# Patient Record
Sex: Female | Born: 1960 | Race: White | Hispanic: No | Marital: Married | Smoking: Current some day smoker
Health system: Southern US, Community
[De-identification: ages and names within clinical notes are randomized; demographics above are authoritative.]

## PROBLEM LIST (undated history)

## (undated) DIAGNOSIS — E039 Hypothyroidism, unspecified: Secondary | ICD-10-CM

## (undated) DIAGNOSIS — E7800 Pure hypercholesterolemia, unspecified: Secondary | ICD-10-CM

## (undated) DIAGNOSIS — I10 Essential (primary) hypertension: Secondary | ICD-10-CM

## (undated) DIAGNOSIS — C50919 Malignant neoplasm of unspecified site of unspecified female breast: Secondary | ICD-10-CM

## (undated) DIAGNOSIS — G4730 Sleep apnea, unspecified: Secondary | ICD-10-CM

## (undated) DIAGNOSIS — M1990 Unspecified osteoarthritis, unspecified site: Secondary | ICD-10-CM

## (undated) DIAGNOSIS — R569 Unspecified convulsions: Secondary | ICD-10-CM

## (undated) DIAGNOSIS — IMO0002 Reserved for concepts with insufficient information to code with codable children: Secondary | ICD-10-CM

## (undated) HISTORY — PX: JOINT REPLACEMENT: SHX530

## (undated) HISTORY — DX: Reserved for concepts with insufficient information to code with codable children: IMO0002

## (undated) HISTORY — DX: Unspecified convulsions: R56.9

## (undated) HISTORY — DX: Pure hypercholesterolemia, unspecified: E78.00

## (undated) HISTORY — DX: Malignant neoplasm of unspecified site of unspecified female breast: C50.919

## (undated) HISTORY — PX: CATARACT EXTRACTION: SUR2

## (undated) HISTORY — DX: Essential (primary) hypertension: I10

## (undated) HISTORY — DX: Hypothyroidism, unspecified: E03.9

## (undated) HISTORY — DX: Unspecified osteoarthritis, unspecified site: M19.90

---

## 1986-11-28 HISTORY — PX: THYROIDECTOMY: SHX17

## 2003-11-29 HISTORY — PX: OTHER SURGICAL HISTORY: SHX169

## 2005-07-13 ENCOUNTER — Ambulatory Visit: Payer: Self-pay | Admitting: Obstetrics and Gynecology

## 2006-07-17 ENCOUNTER — Ambulatory Visit: Payer: Self-pay | Admitting: Obstetrics and Gynecology

## 2007-06-25 ENCOUNTER — Ambulatory Visit: Payer: Self-pay | Admitting: General Practice

## 2007-08-08 ENCOUNTER — Ambulatory Visit: Payer: Self-pay | Admitting: Internal Medicine

## 2008-03-04 ENCOUNTER — Ambulatory Visit: Payer: Self-pay

## 2008-04-29 ENCOUNTER — Ambulatory Visit: Payer: Self-pay | Admitting: Gastroenterology

## 2009-03-20 ENCOUNTER — Ambulatory Visit: Payer: Self-pay

## 2009-12-09 ENCOUNTER — Ambulatory Visit: Payer: Self-pay | Admitting: Specialist

## 2009-12-17 ENCOUNTER — Ambulatory Visit: Payer: Self-pay | Admitting: Specialist

## 2010-05-11 ENCOUNTER — Ambulatory Visit: Payer: Self-pay | Admitting: Internal Medicine

## 2010-06-23 ENCOUNTER — Ambulatory Visit: Payer: Self-pay | Admitting: Otolaryngology

## 2010-06-29 ENCOUNTER — Ambulatory Visit: Payer: Self-pay | Admitting: Otolaryngology

## 2010-08-23 ENCOUNTER — Encounter
Admission: RE | Admit: 2010-08-23 | Discharge: 2010-10-26 | Payer: Self-pay | Source: Home / Self Care | Admitting: Specialist

## 2010-11-28 HISTORY — PX: TOTAL KNEE ARTHROPLASTY: SHX125

## 2011-08-02 ENCOUNTER — Ambulatory Visit (HOSPITAL_COMMUNITY)
Admission: RE | Admit: 2011-08-02 | Discharge: 2011-08-02 | Disposition: A | Discharge status: Home / Self Care | Payer: BC Managed Care – PPO | Source: Ambulatory Visit | Attending: Orthopaedic Surgery | Admitting: Orthopaedic Surgery

## 2011-08-02 ENCOUNTER — Other Ambulatory Visit (HOSPITAL_COMMUNITY): Payer: Self-pay | Admitting: Orthopaedic Surgery

## 2011-08-02 ENCOUNTER — Encounter (HOSPITAL_COMMUNITY)
Admission: RE | Admit: 2011-08-02 | Discharge: 2011-08-02 | Disposition: A | Discharge status: Home / Self Care | Payer: BC Managed Care – PPO | Source: Ambulatory Visit | Attending: Orthopaedic Surgery | Admitting: Orthopaedic Surgery

## 2011-08-02 DIAGNOSIS — Z01811 Encounter for preprocedural respiratory examination: Secondary | ICD-10-CM | POA: Insufficient documentation

## 2011-08-02 DIAGNOSIS — IMO0002 Reserved for concepts with insufficient information to code with codable children: Secondary | ICD-10-CM | POA: Insufficient documentation

## 2011-08-02 DIAGNOSIS — Z01810 Encounter for preprocedural cardiovascular examination: Secondary | ICD-10-CM | POA: Insufficient documentation

## 2011-08-02 DIAGNOSIS — M1712 Unilateral primary osteoarthritis, left knee: Secondary | ICD-10-CM

## 2011-08-02 DIAGNOSIS — Z01812 Encounter for preprocedural laboratory examination: Secondary | ICD-10-CM | POA: Insufficient documentation

## 2011-08-02 DIAGNOSIS — I10 Essential (primary) hypertension: Secondary | ICD-10-CM | POA: Insufficient documentation

## 2011-08-02 DIAGNOSIS — M1710 Unilateral primary osteoarthritis, unspecified knee: Secondary | ICD-10-CM | POA: Insufficient documentation

## 2011-08-02 DIAGNOSIS — G4730 Sleep apnea, unspecified: Secondary | ICD-10-CM | POA: Insufficient documentation

## 2011-08-02 LAB — CBC
MCHC: 35.2 g/dL (ref 30.0–36.0)
Platelets: 278 10*3/uL (ref 150–400)
RDW: 12.7 % (ref 11.5–15.5)
WBC: 7.6 10*3/uL (ref 4.0–10.5)

## 2011-08-02 LAB — URINALYSIS, ROUTINE W REFLEX MICROSCOPIC
Bilirubin Urine: NEGATIVE
Ketones, ur: NEGATIVE mg/dL
Protein, ur: NEGATIVE mg/dL
Specific Gravity, Urine: 1.014 (ref 1.005–1.030)
Urobilinogen, UA: 0.2 mg/dL (ref 0.0–1.0)

## 2011-08-02 LAB — URINE MICROSCOPIC-ADD ON

## 2011-08-02 LAB — BASIC METABOLIC PANEL
BUN: 18 mg/dL (ref 6–23)
Calcium: 10.1 mg/dL (ref 8.4–10.5)
Creatinine, Ser: 0.84 mg/dL (ref 0.50–1.10)
GFR calc Af Amer: >60 mL/min (ref >60)

## 2011-08-02 LAB — PROTIME-INR
INR: 0.9 (ref 0.00–1.49)
Prothrombin Time: 12.3 seconds (ref 11.6–15.2)

## 2011-08-02 LAB — HCG, SERUM, QUALITATIVE: Preg, Serum: NEGATIVE

## 2011-08-09 ENCOUNTER — Inpatient Hospital Stay (HOSPITAL_COMMUNITY)
Admission: RE | Admit: 2011-08-09 | Discharge: 2011-08-12 | DRG: 209 | Disposition: A | Discharge status: Home / Self Care | Payer: BC Managed Care – PPO | Source: Ambulatory Visit | Attending: Orthopaedic Surgery | Admitting: Orthopaedic Surgery

## 2011-08-09 DIAGNOSIS — G40802 Other epilepsy, not intractable, without status epilepticus: Secondary | ICD-10-CM | POA: Diagnosis present

## 2011-08-09 DIAGNOSIS — K219 Gastro-esophageal reflux disease without esophagitis: Secondary | ICD-10-CM | POA: Diagnosis present

## 2011-08-09 DIAGNOSIS — Z01810 Encounter for preprocedural cardiovascular examination: Secondary | ICD-10-CM

## 2011-08-09 DIAGNOSIS — Z01812 Encounter for preprocedural laboratory examination: Secondary | ICD-10-CM

## 2011-08-09 DIAGNOSIS — I10 Essential (primary) hypertension: Secondary | ICD-10-CM | POA: Diagnosis present

## 2011-08-09 DIAGNOSIS — Z01818 Encounter for other preprocedural examination: Secondary | ICD-10-CM

## 2011-08-09 DIAGNOSIS — E039 Hypothyroidism, unspecified: Secondary | ICD-10-CM | POA: Diagnosis present

## 2011-08-09 DIAGNOSIS — G4733 Obstructive sleep apnea (adult) (pediatric): Secondary | ICD-10-CM | POA: Diagnosis present

## 2011-08-09 DIAGNOSIS — M1710 Unilateral primary osteoarthritis, unspecified knee: Principal | ICD-10-CM | POA: Diagnosis present

## 2011-08-09 DIAGNOSIS — M549 Dorsalgia, unspecified: Secondary | ICD-10-CM | POA: Diagnosis present

## 2011-08-09 LAB — TYPE AND SCREEN
ABO/RH(D): O POS
Antibody Screen: NEGATIVE

## 2011-08-09 LAB — ABO/RH: ABO/RH(D): O POS

## 2011-08-10 LAB — BASIC METABOLIC PANEL
Calcium: 8.6 mg/dL (ref 8.4–10.5)
GFR calc non Af Amer: >60 mL/min (ref >60)
Glucose, Bld: 112 mg/dL — ABNORMAL HIGH (ref 70–99)
Potassium: 2.9 mEq/L — ABNORMAL LOW (ref 3.5–5.1)
Sodium: 139 mEq/L (ref 135–145)

## 2011-08-10 LAB — PROTIME-INR: INR: 1.05 (ref 0.00–1.49)

## 2011-08-10 LAB — CBC
HCT: 30.2 % — ABNORMAL LOW (ref 36.0–46.0)
MCV: 92.1 fL (ref 78.0–100.0)
Platelets: 181 10*3/uL (ref 150–400)
RBC: 3.28 MIL/uL — ABNORMAL LOW (ref 3.87–5.11)
RDW: 12.9 % (ref 11.5–15.5)
WBC: 8.4 10*3/uL (ref 4.0–10.5)

## 2011-08-11 LAB — BASIC METABOLIC PANEL
BUN: 7 mg/dL (ref 6–23)
Chloride: 104 mEq/L (ref 96–112)
GFR calc Af Amer: >60 mL/min (ref >60)
GFR calc non Af Amer: >60 mL/min (ref >60)
Potassium: 2.9 mEq/L — ABNORMAL LOW (ref 3.5–5.1)
Sodium: 140 mEq/L (ref 135–145)

## 2011-08-11 LAB — PROTIME-INR
INR: 1.35 (ref 0.00–1.49)
Prothrombin Time: 16.9 seconds — ABNORMAL HIGH (ref 11.6–15.2)

## 2011-08-11 LAB — CBC
Hemoglobin: 9.1 g/dL — ABNORMAL LOW (ref 12.0–15.0)
Platelets: 156 10*3/uL (ref 150–400)
RBC: 2.88 MIL/uL — ABNORMAL LOW (ref 3.87–5.11)
WBC: 7.8 10*3/uL (ref 4.0–10.5)

## 2011-08-12 LAB — CBC
HCT: 27.2 % — ABNORMAL LOW (ref 36.0–46.0)
Hemoglobin: 9.3 g/dL — ABNORMAL LOW (ref 12.0–15.0)
MCHC: 34.2 g/dL (ref 30.0–36.0)
RBC: 2.97 MIL/uL — ABNORMAL LOW (ref 3.87–5.11)
WBC: 7.4 10*3/uL (ref 4.0–10.5)

## 2011-08-12 LAB — PROTIME-INR
INR: 1.41 (ref 0.00–1.49)
Prothrombin Time: 17.5 seconds — ABNORMAL HIGH (ref 11.6–15.2)

## 2011-08-12 LAB — BASIC METABOLIC PANEL
Chloride: 100 mEq/L (ref 96–112)
Creatinine, Ser: 0.62 mg/dL (ref 0.50–1.10)
GFR calc Af Amer: >60 mL/min (ref >60)
Sodium: 140 mEq/L (ref 135–145)

## 2011-08-19 NOTE — H&P (Signed)
NAMETAREVA, LESKE NO.:  [id number]  MEDICAL RECORD NO.:  [id number]  LOCATION:                                 FACILITY:  PHYSICIAN:  Lubertha Basque. Haidy Kackley, M.D.DATE OF BIRTH:  [birth year]  DATE OF ADMISSION:08/09/2011 DATE OF DISCHARGE:                             HISTORY & PHYSICAL   CHIEF COMPLAINT:  Left knee pain.  HISTORY OF PRESENT ILLNESS:  Jennifer Pratt is a patient well-known to our practice who is complaining of an increasing left knee pain.  To the point now, she is having trouble sleeping at nighttime and having pain with every step.  She has having swelling along her medial joint line and patellofemoral area.  She recently had an arthroscopy in January 2011 by one of the doctors at the [facility] in [place], at that time indicated significant patellofemoral degenerative joint disease as well as medial compartment DJD as well.  She has failed oral anti- inflammatory medicines and Supartz injections and we have discussed with her additional treatment options that being total knee replacement on the left side.  PAST MEDICAL HISTORY:  Her PCP doctor is in [place] at the [facility] Dr. Clearance Coots who is getting ready to retire and a PCP doctor will be assigned.  Current medication list consist of Carbatrol, Synthroid, hormone replacement therapy, and Diovan.  REVIEW OF SYSTEMS:  A 14-point review of the review of systems is positive for seizure disorder, hypothyroidism, hypertension.  PAST SURGICAL EXPERIENCE:  Knee arthroscopy in January 2012, also an LRTI right thumb, some time ago a C-section and a partial thyroidectomy.  She has no drug allergies.  SOCIAL HISTORY:  Does not smoke, does not drink more than social alcohol.  PHYSICAL EXAMINATION:  VITAL SIGNS:  Stable.  Pulse regular, respirations unlabored. HEENT:  PERRLA.  Visual fields are normal.  No oropharynx obstructions. LUNGS:  Clear. CARDIAC:  Regular rate and rhythm.  S1  and S2. ABDOMEN:  Soft.  Positive bowel sounds. EXTREMITIES:  Upper extremity motion of joint is full, good pulses, normal neurovascular status.  Lower extremity examination, good pulses distally bilaterally.  Hip motion, ankle motion bilaterally are full and painfree.  The left knee moves 0-125.  There is patellofemoral crepitation at 2+ and also medial joint line pain with a trace effusion. Calf soft and nontender.  Negative Homans.  Good ligamentous stability. No pretibial edema.  Negative for any vascular changes as well.  ASSESSMENT: 1. Left knee end-stage degenerative joint disease, status post     arthroscopy in 2011. 2. History of seizure disorder. 3. History of hypertension. 4. History of hypothyroidism.  X-ray findings show end-stage bone-on-bone degenerative joint disease of her left knee.  Plan is to proceed with a total knee replacement on the left side. Admit the patient postoperatively for pain control and then physical therapy, weightbearing as tolerated, and then make a decision onplacement whether being discharged home versus skilled nursing facility.     Lindwood Qua, P.A.   ______________________________ Lubertha Basque. Jerl Santos, M.D.    MC/MEDQ  D:  08/07/2011  T:  08/07/2011  Job:  [id number]  Electronically Signed by Lindwood Qua P.A. on 08/08/2011 08:32:26 AM Electronically Signed by Theron Arista  Verdelle Valtierra M.D. on 08/19/2011 12:28:43 PM

## 2011-08-19 NOTE — Op Note (Signed)
Jennifer Pratt, Jennifer Pratt NO.:  [id number]  MEDICAL RECORD NO.:  [id number]  LOCATION:  2550                         FACILITY:  [facility]  PHYSICIAN:  Lubertha Basque. Kaitlynd Phillips, M.D.DATE OF BIRTH:  1961-03-13  DATE OF PROCEDURE:  08/09/2011 DATE OF DISCHARGE:                              OPERATIVE REPORT   PREOPERATIVE DIAGNOSIS:  Left knee degenerative joint disease.  POSTOPERATIVE DIAGNOSIS:  Left knee degenerative joint disease.  PROCEDURE:  Left total knee replacement.  ANESTHESIA:  General and block.  ATTENDING SURGEON:  Lubertha Basque. Jerl Santos, MD  ASSISTANT:  Lindwood Qua, PA   INDICATIONS FOR PROCEDURE:  The patient is a 50 year old woman with a long history of a degenerative left knee.  This has become resistant to various injectables and pills.  Preop x-ray shows bone-on-bone contact. She has pain which limits her ability to rest and walk and she is offered a knee replacement.  Informed operative consent was obtained after discussion of possible complications including reaction to anesthesia, infection, DVT, PE, and death.  Importance of the postoperative rehabilitation protocol to optimize result was stressed extensively with the patient.  SUMMARY, FINDINGS, AND PROCEDURE:  Under general anesthesia and a block, a left knee replacement was performed.  She had advanced degenerative change, but excellent bone quality.  We addressed her problem with a cemented DePuy LCS system using a standard plus femur, 10 deep dish insert, 4 tibial tray, and 38 all-polyethylene patella.  I included Zinacef antibiotic in the cement.  Lindwood Qua assisted throughout and was invaluable to the completion of the case in that he helped position and retract while I performed the procedure.  He also closed simultaneously to help minimize OR time.  DESCRIPTION OR PROCEDURE:  The patient was taken to the operating suite where general anesthetic was applied without difficulty.   She was also given a block in the preanesthesia area.  She was positioned supine and prepped and draped in a normal sterile fashion.  After administration of IV Kefzol, the left leg was elevated, exsanguinated, tourniquet inflated about the thigh.  A longitudinal anterior incision was made with dissection down the extensor mechanism.  All appropriate antiinfective measures were used including closed hooded exhaust systems for each member of the surgical team, Betadine-impregnated drape, and preoperative IV antibiotic.  A medial parapatellar incision was made in the extensor mechanism.  The kneecap was flipped and the knee flexed. Some residual meniscal tissues were removed along with the ACL and PCL and fat pad.  An extramedullary guide was placed on the tibia to make a roughly flat cut.  An intramedullary guide was then placed in the femur to make anterior and posterior cuts creating a flexion gap of 10 mm.  A second intramedullary guide was placed in the femur to make a distal cut creating an equal extension gap of 10 mm balancing the knee.  The femur sized to a standard plus and the tibia to a 4 with appropriate guides placed and utilized.  The patella was cut down thickness by about 10 mm to 15 and sized to a 38 with the appropriate guide placed and utilized. The trial reduction was done with all these components and  the 10 spacer.  She easily came to slight hyperextension and flexed well with patella tracking in a normal position.  The trial components were removed followed by pulsatile lavage irrigation of all three cut bony surfaces.  Cement was mixed including Zinacef and was pressurized onto the bones followed by placement of the aforementioned DePuy LCS components.  Excess cement was trimmed and pressure was held on the components until the cement hardened.  The tourniquet was deflated and a small amount of bleeding was easily controlled with Bovie cautery and pressure.  The  wound was irrigated followed by placement of drain exiting superolaterally.  The extensor mechanism was reapproximated with #1 Vicryl in an interrupted fashion followed by subcutaneous reapproximation with 0 and 2-0 undyed Vicryl and skin closure with staples.  Adaptic was applied followed by dry gauze and loose Ace wrap. Estimated blood loss and intraoperative fluids can be obtained from anesthesia records as can accurate tourniquet time.  DISPOSITION:  The patient was extubated in the operating room and taken to the recovery room in stable addition.  She was to be admitted to the Orthopedic Surgery Service for appropriate postop care to include perioperative antibiotics and Coumadin plus Lovenox for DVT prophylaxis.     Lubertha Basque Jerl Santos, M.D.     PGD/MEDQ  D:  08/09/2011  T:  08/09/2011  Job:  [id number]  Electronically Signed by Marcene Corning M.D. on 08/19/2011 12:29:30 PM

## 2011-08-19 NOTE — Discharge Summary (Signed)
Jennifer Pratt, Jennifer Pratt NO.:  [id number]  MEDICAL RECORD NO.:  [id number]  LOCATION:  5038                         FACILITY:  [facility]  PHYSICIAN:  Lubertha Basque. Jerelyn Trimarco, M.D.DATE OF BIRTH:  1961-08-16  DATE OF ADMISSION:  08/09/2011 DATE OF DISCHARGE:  08/12/2011                              DISCHARGE SUMMARY   ADMITTING DIAGNOSES: 1. Left knee pain, end-stage degenerative joint disease. 2. History of seizure disorder. 3. History of hypertension. 4. History of hypothyroidism.  DISCHARGE DIAGNOSES: 1. Left knee pain, end-stage degenerative joint disease. 2. History of seizure disorder. 3. History of hypertension. 4. History of hypothyroidism. 5. Hypokalemia.  OPERATION:  Left total knee replacement.  BRIEF HISTORY:  Jennifer Pratt is a patient well known to our practice who has had increasing left knee pain and swelling along her medial joint line in patellofemoral area.  She has had an arthroscopy back in January 2011, by doctors at the [facility] in [place], and was noted at that time to have significant DJD.  Her x-rays revealed bone-on-bone end- stage arthritis in her left knee.  She has failed antiinflammatory medicines, corticosteroid injections and Supartz injections as well, and we have discussed treatment options that being total knee replacement.  PERTINENT LABORATORY AND X-RAY FINDINGS:  WBCs 7.6, RBCs 4.47, hemoglobin 14.3 with a drop down to 9.3.  Potassium 3.5 with a drop to 2.9 and this was supplemented as necessary.  Sodium 141, BUN 18, creatinine 0.84.  Last INR was 1.35 as she is on Coumadin for DVT prophylaxis.  COURSE IN THE HOSPITAL:  She was admitted postoperatively and placed on variety of p.o. and IV pain medications including a PCA Dilaudid pump. We kept on all of her lower home medicines which are outlined in the med discharge management sheet.  She is on a regular diet.  We had implemented total joint replacement protocol  orders as well as p.r.n. Orthopedic orders.  She had initially Lovenox and then Coumadin for DVT prophylaxis, regulated by pharmacy along with knee-high TEDs. Perioperative fluids and perioperative antibiotics which was Ancef, Foley catheter used for the first 24 hours and then discontinued. Physical therapy for weightbearing as tolerated, advancing of activities as tolerated as well.  The first day postoperative, her blood pressure was 100/64, temperature was 98, hemoglobin was 10.4, positive bowel sounds, negative guarding, no spleen or liver enlargement.  Breath sounds in all lung fields.  Foley catheter was in and was discontinued and able to void on her own.  Her postoperative drain in her left knee had been pulled up by accident, we changed her dressing the first day postoperative.  Wound was noted to be benign.  No sign of infection or erythema and drainage with good neurovascular status and calf soft and nontender.  The second day postop, the potassium was slightly low and this has been supplemented orally.  She worked with Physical Therapy, walked a total of 200 plus feet.  [facility] was arranged for home physical therapy and blood draws for INR and potentially potassium or electrolyte panel, but she continued abnormal vital signs and knee without sign of infection, eating and voiding well, and had a bowel movement.  CONDITION ON DISCHARGE:  Improved.  FOLLOWUP:  She remained on a low-sodium, heart-healthy diet.  May change her dressing daily.  Any sign of infection which will be redness, drainage, increasing pain, will call our office at [phone] for a visit. She will remain on Coumadin for 2 weeks postop with an INR tentatively between 2.0 and 3.0, and she was given three prescriptions, one for Coumadin, one for Robaxin, one for Percocet.  She will also remain on all of her home medicines as well as a potassium supplement that she has at home.  We will see her back  in our office in 2 weeks.     Lindwood Qua, P.A.   ______________________________ Lubertha Basque. Jerl Santos, M.D.    MC/MEDQ  D:  08/11/2011  T:  08/12/2011  Job:  [id number]  Electronically Signed by Lindwood Qua P.A. on 08/14/2011 10:53:10 AM Electronically Signed by Marcene Corning M.D. on 08/19/2011 12:29:35 PM

## 2012-05-15 ENCOUNTER — Ambulatory Visit: Payer: Self-pay | Admitting: Internal Medicine

## 2012-11-13 ENCOUNTER — Telehealth: Payer: Self-pay | Admitting: Internal Medicine

## 2012-11-13 NOTE — Telephone Encounter (Signed)
error 

## 2012-11-19 ENCOUNTER — Telehealth: Payer: Self-pay | Admitting: *Deleted

## 2012-11-19 NOTE — Telephone Encounter (Signed)
Cymbalta 30 mg capsule   Take one capsule by mouth every day  #30

## 2012-12-12 ENCOUNTER — Other Ambulatory Visit: Payer: Self-pay | Admitting: Internal Medicine

## 2012-12-13 NOTE — Telephone Encounter (Signed)
Sent in to pharmacy.  

## 2013-01-08 ENCOUNTER — Telehealth: Payer: Self-pay | Admitting: *Deleted

## 2013-01-08 NOTE — Telephone Encounter (Signed)
Dr. Lorin Picket, would you like me to try to move patient up earlier to see you.

## 2013-01-08 NOTE — Telephone Encounter (Signed)
Had a cancellation for 01/09/13.  Any way she could come at the open spot - I think at 10:45.

## 2013-01-08 NOTE — Telephone Encounter (Signed)
Patient was put on Symbalta about six months ago and she is wanting to talk to the physician about this medication.

## 2013-01-09 ENCOUNTER — Encounter: Payer: Self-pay | Admitting: Internal Medicine

## 2013-01-09 ENCOUNTER — Ambulatory Visit (INDEPENDENT_AMBULATORY_CARE_PROVIDER_SITE_OTHER): Payer: BC Managed Care – PPO | Admitting: Internal Medicine

## 2013-01-09 VITALS — BP 122/86 | HR 82 | Temp 98.8°F | Resp 16 | Ht 69.0 in | Wt 222.0 lb

## 2013-01-09 DIAGNOSIS — I10 Essential (primary) hypertension: Secondary | ICD-10-CM

## 2013-01-09 DIAGNOSIS — G40909 Epilepsy, unspecified, not intractable, without status epilepticus: Secondary | ICD-10-CM

## 2013-01-09 DIAGNOSIS — K259 Gastric ulcer, unspecified as acute or chronic, without hemorrhage or perforation: Secondary | ICD-10-CM

## 2013-01-09 DIAGNOSIS — G4733 Obstructive sleep apnea (adult) (pediatric): Secondary | ICD-10-CM

## 2013-01-09 DIAGNOSIS — E7800 Pure hypercholesterolemia, unspecified: Secondary | ICD-10-CM

## 2013-01-09 DIAGNOSIS — E039 Hypothyroidism, unspecified: Secondary | ICD-10-CM

## 2013-01-09 MED ORDER — DULOXETINE HCL 30 MG PO CPEP: 30.0000 mg | ORAL_CAPSULE | Freq: Every day | ORAL | Status: DC

## 2013-01-09 NOTE — Telephone Encounter (Signed)
Pt aware of appointment 2/12 @ 10:45.  She will be here

## 2013-01-11 ENCOUNTER — Encounter: Payer: Self-pay | Admitting: Internal Medicine

## 2013-01-11 DIAGNOSIS — I10 Essential (primary) hypertension: Secondary | ICD-10-CM | POA: Insufficient documentation

## 2013-01-11 DIAGNOSIS — E039 Hypothyroidism, unspecified: Secondary | ICD-10-CM | POA: Insufficient documentation

## 2013-01-11 DIAGNOSIS — E7800 Pure hypercholesterolemia, unspecified: Secondary | ICD-10-CM | POA: Insufficient documentation

## 2013-01-11 DIAGNOSIS — G40909 Epilepsy, unspecified, not intractable, without status epilepticus: Secondary | ICD-10-CM | POA: Insufficient documentation

## 2013-01-11 DIAGNOSIS — K259 Gastric ulcer, unspecified as acute or chronic, without hemorrhage or perforation: Secondary | ICD-10-CM | POA: Insufficient documentation

## 2013-01-11 DIAGNOSIS — G4733 Obstructive sleep apnea (adult) (pediatric): Secondary | ICD-10-CM | POA: Insufficient documentation

## 2013-01-11 NOTE — Progress Notes (Signed)
Subjective:    Patient ID: Jennifer Pratt, female    DOB: 01/25/1961, 52 y.o.   MRN: [id number]  HPI 52 year old female with past history of hypertension, hypercholesterolemia, gastric ulcer and seizure disorder.  She comes in today for a scheduled follow up.  She previously had been on cymbalta.  Was able to get off meloxicam and ultram - when on the cymbalta.  Since being off the cymbalta, she has noticed pain returning.  Was exercising 3x/week.  Plans to restart.  No chest pain or tightness.  No sob.  Eating and drinking well.  Bowels stable.    Past Medical History  Diagnosis Date  . Hypothyroidism   . Hypertension   . Arthritis   . Seizures   . Ulcer     Gastric  . Hypercholesterolemia      Outpatient Encounter Prescriptions as of 01/09/2013  Medication Sig Dispense Refill  . carbamazepine (CARBATROL) 200 MG 12 hr capsule Take 200 mg by mouth 2 (two) times daily.      . DULoxetine (CYMBALTA) 30 MG capsule Take 1 capsule (30 mg total) by mouth daily.  30 capsule  4  . levothyroxine (SYNTHROID, LEVOTHROID) 150 MCG tablet TAKE 1 TABLET BY MOUTH EVERY DAY  30 tablet  2  . Multiple Vitamin (MULTIVITAMIN) tablet Take 1 tablet by mouth daily.      Marland Kitchen NASONEX 50 MCG/ACT nasal spray USE 2 PUFFS IN EACH NOSTRIL DAILY  17 g  2  . omeprazole (PRILOSEC OTC) 20 MG tablet Take 20 mg by mouth daily.      . valsartan-hydrochlorothiazide (DIOVAN-HCT) 160-25 MG per tablet Take 1 tablet by mouth daily.      . [DISCONTINUED] DULoxetine (CYMBALTA) 30 MG capsule Take 30 mg by mouth daily.      . [DISCONTINUED] meloxicam (MOBIC) 15 MG tablet Take 15 mg by mouth daily.       No facility-administered encounter medications on file as of 01/09/2013.    Review of Systems Patient denies any headache, lightheadedness or dizziness.  No significant sinus or allergy symptoms.  No chest pain, tightness or palpitations.  No increased shortness of breath, cough or congestion.  No nausea or vomiting. No acid  reflux.   No abdominal pain or cramping.  No bowel change, such as diarrhea, constipation, BRBPR or melana.  No urine change.  Did well with the cymbalta.  Wants to restart.       Objective:   Physical Exam Filed Vitals:   01/09/13 1100  BP: 122/86  Pulse: 82  Temp: 98.8 F (37.1 C)  Resp: 16   Blood pressure recheck:  130/82 (left) and 128/84-86 (right)  52 year old female in no acute distress.   HEENT:  Nares- clear.  Oropharynx - without lesions. NECK:  Supple.  Nontender.  No audible bruit.  HEART:  Appears to be regular. LUNGS:  No crackles or wheezing audible.  Respirations even and unlabored.  RADIAL PULSE:  Equal bilaterally.   ABDOMEN:  Soft, nontender.  Bowel sounds present and normal.  No audible abdominal bruit.    EXTREMITIES:  No increased edema present.  DP pulses palpable and equal bilaterally.      SKIN:  No rash.       Assessment & Plan:  MSK.  Did well with cymbalta.  Will restart 30mg  q day.  Remain off ultram and meloxicam.  Follow.  Restart her exercise.    HEALTH MAINTENANCE.  Physical 05/03/12.  Mammogram 05/15/12 -  BiRADS I.  Colonoscopy 07/24/12 - obtain results.  Schedule physical - next visit.

## 2013-01-11 NOTE — Assessment & Plan Note (Signed)
Using CPAP.  Follow.  

## 2013-01-11 NOTE — Assessment & Plan Note (Signed)
Low cholesterol diet and exercise.  Check lipid panel.   

## 2013-01-11 NOTE — Assessment & Plan Note (Signed)
Symptoms controlled.  On omeprazole.   

## 2013-01-11 NOTE — Assessment & Plan Note (Signed)
Currently stable.  Followed by Dr Willis and Carolyn Martin.  Off tramadol.    

## 2013-01-11 NOTE — Assessment & Plan Note (Signed)
On thyroid replacement.  Check tsh.  

## 2013-01-11 NOTE — Assessment & Plan Note (Signed)
Blood pressure as outlined.  Have her spot check her pressures and send in.  Follow. Check metabolic panel.

## 2013-01-29 ENCOUNTER — Other Ambulatory Visit: Payer: BC Managed Care – PPO

## 2013-02-06 ENCOUNTER — Other Ambulatory Visit (INDEPENDENT_AMBULATORY_CARE_PROVIDER_SITE_OTHER): Payer: BC Managed Care – PPO

## 2013-02-06 DIAGNOSIS — E039 Hypothyroidism, unspecified: Secondary | ICD-10-CM

## 2013-02-06 DIAGNOSIS — I10 Essential (primary) hypertension: Secondary | ICD-10-CM

## 2013-02-06 DIAGNOSIS — E7800 Pure hypercholesterolemia, unspecified: Secondary | ICD-10-CM

## 2013-02-06 LAB — COMPREHENSIVE METABOLIC PANEL
ALT: 18 U/L (ref 0–35)
AST: 19 U/L (ref 0–37)
Calcium: 9.2 mg/dL (ref 8.4–10.5)
Chloride: 99 mEq/L (ref 96–112)
Creatinine, Ser: 0.9 mg/dL (ref 0.4–1.2)
Sodium: 138 mEq/L (ref 135–145)
Total Bilirubin: 0.8 mg/dL (ref 0.3–1.2)
Total Protein: 7.3 g/dL (ref 6.0–8.3)

## 2013-02-06 LAB — LIPID PANEL
HDL: 57.2 mg/dL (ref >39.00)
VLDL: 40.8 mg/dL — ABNORMAL HIGH (ref 0.0–40.0)

## 2013-02-06 LAB — LDL CHOLESTEROL, DIRECT: Direct LDL: 138.2 mg/dL

## 2013-02-06 LAB — TSH: TSH: 7.57 u[IU]/mL — ABNORMAL HIGH (ref 0.35–5.50)

## 2013-02-20 ENCOUNTER — Telehealth: Payer: Self-pay | Admitting: Internal Medicine

## 2013-02-20 NOTE — Telephone Encounter (Signed)
Patient wanting her lab results

## 2013-02-20 NOTE — Telephone Encounter (Signed)
Lab results not in yet.

## 2013-02-23 ENCOUNTER — Telehealth: Payer: Self-pay | Admitting: Internal Medicine

## 2013-02-23 DIAGNOSIS — E876 Hypokalemia: Secondary | ICD-10-CM

## 2013-02-23 DIAGNOSIS — E039 Hypothyroidism, unspecified: Secondary | ICD-10-CM

## 2013-02-23 MED ORDER — LEVOTHYROXINE SODIUM 175 MCG PO TABS: 175.0000 ug | ORAL_TABLET | Freq: Every day | ORAL | Status: DC

## 2013-02-23 NOTE — Telephone Encounter (Signed)
Pt notified of lab results and need to change thyroid medication to q day.  Recheck tsh in 4 weeks.  Will send info on foods with increased potassium and recheck potassium in 4 weeks.  Send [employer].  Will follow.  Please schedule pt for labs on 03/27/13 at 8:15.  Pt aware of appt and all information.  Just need to put on lab schedule.  Thanks.

## 2013-02-25 ENCOUNTER — Ambulatory Visit: Payer: BC Managed Care – PPO | Admitting: Internal Medicine

## 2013-02-25 NOTE — Telephone Encounter (Signed)
Appointment made

## 2013-03-04 NOTE — Telephone Encounter (Signed)
Results received

## 2013-03-14 ENCOUNTER — Other Ambulatory Visit: Payer: Self-pay | Admitting: Internal Medicine

## 2013-03-14 ENCOUNTER — Other Ambulatory Visit: Payer: Self-pay

## 2013-03-14 MED ORDER — LEVOTHYROXINE SODIUM 175 MCG PO TABS: 175.0000 ug | ORAL_TABLET | Freq: Every day | ORAL | Status: DC

## 2013-03-14 NOTE — Telephone Encounter (Signed)
Sent medication refill for Synthroid #30 with 3 refills to the [facility]

## 2013-03-14 NOTE — Telephone Encounter (Signed)
Regarding her thyroid med refill - on 02/06/13 phone note - she was changed to .  It was not changed on the med list.  Ok to refill q day #30 with 3 refills.  Thanks.

## 2013-03-14 NOTE — Telephone Encounter (Signed)
Please Advise..... On pt medication list she is getting  Synthroid #30 with 3 RF. [facility] sent a request for the Synthroid 150 mcg #30 with 2 RF.

## 2013-03-27 ENCOUNTER — Other Ambulatory Visit (INDEPENDENT_AMBULATORY_CARE_PROVIDER_SITE_OTHER): Payer: BC Managed Care – PPO

## 2013-03-27 DIAGNOSIS — E039 Hypothyroidism, unspecified: Secondary | ICD-10-CM

## 2013-03-27 DIAGNOSIS — E876 Hypokalemia: Secondary | ICD-10-CM

## 2013-04-03 ENCOUNTER — Encounter: Payer: Self-pay | Admitting: Internal Medicine

## 2013-04-03 ENCOUNTER — Ambulatory Visit (INDEPENDENT_AMBULATORY_CARE_PROVIDER_SITE_OTHER): Payer: BC Managed Care – PPO | Admitting: Internal Medicine

## 2013-04-03 VITALS — BP 118/78 | HR 92 | Temp 99.2°F | Ht 69.0 in | Wt 218.0 lb

## 2013-04-03 DIAGNOSIS — E7800 Pure hypercholesterolemia, unspecified: Secondary | ICD-10-CM

## 2013-04-03 DIAGNOSIS — G40909 Epilepsy, unspecified, not intractable, without status epilepticus: Secondary | ICD-10-CM

## 2013-04-03 DIAGNOSIS — E876 Hypokalemia: Secondary | ICD-10-CM

## 2013-04-03 DIAGNOSIS — E039 Hypothyroidism, unspecified: Secondary | ICD-10-CM

## 2013-04-03 DIAGNOSIS — G4733 Obstructive sleep apnea (adult) (pediatric): Secondary | ICD-10-CM

## 2013-04-03 DIAGNOSIS — I10 Essential (primary) hypertension: Secondary | ICD-10-CM

## 2013-04-03 DIAGNOSIS — K259 Gastric ulcer, unspecified as acute or chronic, without hemorrhage or perforation: Secondary | ICD-10-CM

## 2013-04-03 MED ORDER — TRIAMTERENE-HCTZ 37.5-25 MG PO TABS: 1.0000 | ORAL_TABLET | Freq: Every day | ORAL | Status: DC

## 2013-04-03 MED ORDER — VALSARTAN 160 MG PO TABS: 160.0000 mg | ORAL_TABLET | Freq: Every day | ORAL | Status: DC

## 2013-04-03 NOTE — Progress Notes (Signed)
  Subjective:    Patient ID: Jennifer Pratt, female    DOB: [birth year], 52 y.o.   MRN: [id number]  HPI 52 year old female with past history of hypertension, hypercholesterolemia, gastric ulcer and seizure disorder.  She comes in today for a scheduled follow up.  Here to discuss her low potassium and to discuss changing her medication.  Her potassium has been low for a while.  She is on Valsartan/HCTZ.  Here to discuss changing her medication.   Is exercising.  Has lost weight.  No chest pain or tightness.  No sob.  Eating and drinking well.  Bowels stable.    Past Medical History  Diagnosis Date  . Hypothyroidism   . Hypertension   . Arthritis   . Seizures   . Ulcer     Gastric  . Hypercholesterolemia      Outpatient Encounter Prescriptions as of 04/03/2013  Medication Sig Dispense Refill  . carbamazepine (CARBATROL) 200 MG 12 hr capsule Take 200 mg by mouth 2 (two) times daily.      . DULoxetine (CYMBALTA) 30 MG capsule Take 1 capsule (30 mg total) by mouth daily.  30 capsule  4  . levothyroxine (SYNTHROID) 175 MCG tablet Take 1 tablet (175 mcg total) by mouth daily.  30 tablet  3  . Multiple Vitamin (MULTIVITAMIN) tablet Take 1 tablet by mouth daily.      Marland Kitchen omeprazole (PRILOSEC OTC) 20 MG tablet Take 20 mg by mouth daily.      . [DISCONTINUED] valsartan-hydrochlorothiazide (DIOVAN-HCT) 160-25 MG per tablet Take 1 tablet by mouth daily.      Marland Kitchen NASONEX 50 MCG/ACT nasal spray USE 2 PUFFS IN EACH NOSTRIL DAILY  17 g  2  . triamterene-hydrochlorothiazide (MAXZIDE-25) 37.5-25 MG per tablet Take 1 tablet by mouth daily.  30 tablet  3  . valsartan (DIOVAN) 160 MG tablet Take 1 tablet (160 mg total) by mouth daily.  30 tablet  3   No facility-administered encounter medications on file as of 04/03/2013.    Review of Systems Patient denies any headache, lightheadedness or dizziness.  No significant sinus or allergy symptoms.  No chest pain, tightness or palpitations.  No increased shortness of  breath, cough or congestion.  Breathing better.  No nausea or vomiting. No acid reflux.   No abdominal pain or cramping.  No bowel change, such as diarrhea, constipation, BRBPR or melana.  No urine change.  Exercising.  Feels better.       Objective:   Physical Exam  Filed Vitals:   04/03/13 0805  BP: 118/78  Pulse: 92  Temp: 99.2 F (37.3 C)   Blood pressure recheck:  120/78, pulse 44  52 year old female in no acute distress.   HEENT:  Nares- clear.  Oropharynx - without lesions. NECK:  Supple.  Nontender.  No audible bruit.  HEART:  Appears to be regular. LUNGS:  No crackles or wheezing audible.  Respirations even and unlabored.  RADIAL PULSE:  Equal bilaterally.   ABDOMEN:  Soft, nontender.  Bowel sounds present and normal.  No audible abdominal bruit.    EXTREMITIES:  No increased edema present.  DP pulses palpable and equal bilaterally.      Assessment & Plan:  MSK.  Back on cymbalta.  Doing well.  Exercising.    HEALTH MAINTENANCE.  Physical 05/03/12.  Mammogram 05/15/12 - BiRADS I.  Colonoscopy 07/24/12 - obtain results.  Schedule physical - next visit.

## 2013-04-07 ENCOUNTER — Encounter: Payer: Self-pay | Admitting: Internal Medicine

## 2013-04-07 NOTE — Assessment & Plan Note (Signed)
On thyroid replacement.  Follow tsh.  

## 2013-04-07 NOTE — Assessment & Plan Note (Signed)
Blood pressure as outlined. Doing well.  Persistent decreased potassium.  Will change valsartan/hctz 160/25 to valsartan 160mg  q day and triam/hctz 37.5/25 q day.  Recheck potassium in two weeks.

## 2013-04-07 NOTE — Assessment & Plan Note (Signed)
Currently stable.  Followed by Dr Willis and Carolyn Martin.  Off tramadol.    

## 2013-04-07 NOTE — Assessment & Plan Note (Signed)
Symptoms controlled.  On omeprazole.   

## 2013-04-07 NOTE — Assessment & Plan Note (Signed)
Low cholesterol diet and exercise.  Follow lipid panel.   

## 2013-04-07 NOTE — Assessment & Plan Note (Signed)
Using CPAP.  Follow.  

## 2013-04-18 ENCOUNTER — Telehealth: Payer: Self-pay | Admitting: Internal Medicine

## 2013-04-18 ENCOUNTER — Other Ambulatory Visit (INDEPENDENT_AMBULATORY_CARE_PROVIDER_SITE_OTHER): Payer: BC Managed Care – PPO

## 2013-04-18 DIAGNOSIS — E876 Hypokalemia: Secondary | ICD-10-CM

## 2013-04-18 NOTE — Telephone Encounter (Signed)
Jennifer Pratt came in today for lab work  She has a cpx schedule for 05/07/13 @ 3:30 and her mom Jennifer Pratt ([birth year]/09/35) has a new patient appointment with you on 05/30/13 @ 9:45.  She wanted to know if they could switch appointment date and time.  You have 2 new patient on 7/3 so I told Jennifer Pratt I needed to check with you before switching

## 2013-04-19 ENCOUNTER — Encounter: Payer: Self-pay | Admitting: *Deleted

## 2013-04-19 NOTE — Telephone Encounter (Signed)
I would prefer for Keondria to keep her appt as scheduled.  I can see Nicoletta Dress on 04/25/13 at 11:00.  Will need to block slot - new pt.  Also, can you call [facility] and request her last note be sent over.  Can call michelle [phone] ext [phone] and leave her a message to send.  Thanks

## 2013-04-19 NOTE — Telephone Encounter (Signed)
Spoke with ms Marone she will be here for her appointment and she wanted to keep her mom's July appointment

## 2013-04-19 NOTE — Telephone Encounter (Signed)
Left message  For pt to call office see dr scott's note below.  i did make appointment for her mom for 5/29 confirm she wants to keep may or July appointment. Cancel the one pt does not want

## 2013-05-07 ENCOUNTER — Encounter: Payer: BC Managed Care – PPO | Admitting: Internal Medicine

## 2013-06-06 ENCOUNTER — Other Ambulatory Visit: Payer: Self-pay | Admitting: *Deleted

## 2013-06-06 MED ORDER — DULOXETINE HCL 30 MG PO CPEP: 30.0000 mg | ORAL_CAPSULE | Freq: Every day | ORAL | Status: DC

## 2013-06-24 ENCOUNTER — Other Ambulatory Visit (HOSPITAL_COMMUNITY)
Admission: RE | Admit: 2013-06-24 | Discharge: 2013-06-24 | Disposition: A | Discharge status: Home / Self Care | Payer: BC Managed Care – PPO | Source: Ambulatory Visit | Attending: Internal Medicine | Admitting: Internal Medicine

## 2013-06-24 ENCOUNTER — Encounter: Payer: Self-pay | Admitting: Internal Medicine

## 2013-06-24 ENCOUNTER — Ambulatory Visit (INDEPENDENT_AMBULATORY_CARE_PROVIDER_SITE_OTHER): Payer: BC Managed Care – PPO | Admitting: Internal Medicine

## 2013-06-24 VITALS — BP 122/70 | HR 96 | Temp 99.0°F | Ht 68.0 in | Wt 215.5 lb

## 2013-06-24 DIAGNOSIS — E039 Hypothyroidism, unspecified: Secondary | ICD-10-CM

## 2013-06-24 DIAGNOSIS — R5381 Other malaise: Secondary | ICD-10-CM

## 2013-06-24 DIAGNOSIS — I10 Essential (primary) hypertension: Secondary | ICD-10-CM

## 2013-06-24 DIAGNOSIS — E7800 Pure hypercholesterolemia, unspecified: Secondary | ICD-10-CM

## 2013-06-24 DIAGNOSIS — Z124 Encounter for screening for malignant neoplasm of cervix: Secondary | ICD-10-CM

## 2013-06-24 DIAGNOSIS — Z01419 Encounter for gynecological examination (general) (routine) without abnormal findings: Secondary | ICD-10-CM | POA: Insufficient documentation

## 2013-06-24 DIAGNOSIS — R5383 Other fatigue: Secondary | ICD-10-CM

## 2013-06-24 DIAGNOSIS — E876 Hypokalemia: Secondary | ICD-10-CM | POA: Insufficient documentation

## 2013-06-24 DIAGNOSIS — Z1151 Encounter for screening for human papillomavirus (HPV): Secondary | ICD-10-CM | POA: Insufficient documentation

## 2013-06-24 DIAGNOSIS — G4733 Obstructive sleep apnea (adult) (pediatric): Secondary | ICD-10-CM

## 2013-06-24 DIAGNOSIS — G40909 Epilepsy, unspecified, not intractable, without status epilepticus: Secondary | ICD-10-CM

## 2013-06-24 LAB — COMPREHENSIVE METABOLIC PANEL
AST: 20 U/L (ref 0–37)
Alkaline Phosphatase: 68 U/L (ref 39–117)
BUN: 17 mg/dL (ref 6–23)
Calcium: 9.7 mg/dL (ref 8.4–10.5)
Chloride: 101 mEq/L (ref 96–112)
Creatinine, Ser: 0.8 mg/dL (ref 0.4–1.2)

## 2013-06-24 LAB — CBC WITH DIFFERENTIAL/PLATELET
Basophils Relative: 0.8 % (ref 0.0–3.0)
Eosinophils Absolute: 0.1 10*3/uL (ref 0.0–0.7)
HCT: 37.1 % (ref 36.0–46.0)
Hemoglobin: 12.7 g/dL (ref 12.0–15.0)
Lymphs Abs: 2.1 10*3/uL (ref 0.7–4.0)
MCHC: 34.2 g/dL (ref 30.0–36.0)
MCV: 92.8 fl (ref 78.0–100.0)
Monocytes Absolute: 0.4 10*3/uL (ref 0.1–1.0)
Neutro Abs: 2.4 10*3/uL (ref 1.4–7.7)
RBC: 4 Mil/uL (ref 3.87–5.11)

## 2013-06-24 LAB — T3, FREE: T3, Free: 2.5 pg/mL (ref 2.3–4.2)

## 2013-06-24 LAB — T4, FREE: Free T4: 0.86 ng/dL (ref 0.60–1.60)

## 2013-06-24 MED ORDER — DULOXETINE HCL 30 MG PO CPEP: 30.0000 mg | ORAL_CAPSULE | Freq: Every day | ORAL | Status: DC

## 2013-06-24 NOTE — Assessment & Plan Note (Signed)
Currently stable.  Followed by Dr Willis and Carolyn Martin.  Off tramadol.    

## 2013-06-24 NOTE — Assessment & Plan Note (Signed)
Changed to triam/hctz.  Check potassium today.

## 2013-06-24 NOTE — Assessment & Plan Note (Signed)
Symptoms controlled.  On omeprazole.   

## 2013-06-24 NOTE — Progress Notes (Signed)
Subjective:    Patient ID: Jennifer Pratt, female    DOB: Nov 08, 1961, 52 y.o.   MRN: [id number]  HPI 52 year old female with past history of hypertension, hypercholesterolemia, gastric ulcer and seizure disorder.  She comes in today to follow up on these issues as well as for a complete physical exam.   She states she is doing well.   Has lost weight.  No chest pain or tightness.  No sob.  Eating and drinking well.  Bowels stable.  She still reports some occasional fatigue, but overall feels good.  Sleeping well.  CPAP working well.  Blood pressure has been doing well.     Past Medical History  Diagnosis Date  . Hypothyroidism   . Hypertension   . Arthritis   . Seizures   . Ulcer     Gastric  . Hypercholesterolemia     Outpatient Encounter Prescriptions as of 06/24/2013  Medication Sig Dispense Refill  . carbamazepine (CARBATROL) 200 MG 12 hr capsule Take 200 mg by mouth 2 (two) times daily.      . DULoxetine (CYMBALTA) 30 MG capsule Take 1 capsule (30 mg total) by mouth daily.  30 capsule  2  . levothyroxine (SYNTHROID) 175 MCG tablet Take 1 tablet (175 mcg total) by mouth daily.  30 tablet  3  . Multiple Vitamin (MULTIVITAMIN) tablet Take 1 tablet by mouth daily.      Marland Kitchen omeprazole (PRILOSEC OTC) 20 MG tablet Take 20 mg by mouth daily.      Marland Kitchen triamterene-hydrochlorothiazide (MAXZIDE-25) 37.5-25 MG per tablet Take 1 tablet by mouth daily.  30 tablet  3  . valsartan (DIOVAN) 160 MG tablet Take 1 tablet (160 mg total) by mouth daily.  30 tablet  3  . [DISCONTINUED] NASONEX 50 MCG/ACT nasal spray USE 2 PUFFS IN EACH NOSTRIL DAILY  17 g  2   No facility-administered encounter medications on file as of 06/24/2013.    Review of Systems Patient denies any headache, lightheadedness or dizziness.  No significant sinus or allergy symptoms.  No chest pain, tightness or palpitations.  No increased shortness of breath, cough or congestion.  Breathing better.  No nausea or vomiting. No acid  reflux.   No abdominal pain or cramping.  No bowel change, such as diarrhea, constipation, BRBPR or melana.  No urine change.  Exercising.  Feels better.       Objective:   Physical Exam  Filed Vitals:   06/24/13 0804  BP: 122/70  Pulse: 96  Temp: 99 F (37.2 C)   Blood pressure recheck:  118/78, pulse 69  52 year old female in no acute distress.   HEENT:  Nares- clear.  Oropharynx - without lesions. NECK:  Supple.  Nontender.  No audible bruit.  HEART:  Appears to be regular. LUNGS:  No crackles or wheezing audible.  Respirations even and unlabored.  RADIAL PULSE:  Equal bilaterally.    BREASTS:  No nipple discharge or nipple retraction present.  Could not appreciate any distinct nodules or axillary adenopathy.  ABDOMEN:  Soft, nontender.  Bowel sounds present and normal.  No audible abdominal bruit.  GU:  Normal external genitalia.  Vaginal vault without lesions.  Cervix identified.  Pap performed. Could not appreciate any adnexal masses or tenderness.   RECTAL:  Heme negative.   EXTREMITIES:  No increased edema present.  DP pulses palpable and equal bilaterally.          Assessment & Plan:  MSK.  Back on cymbalta.  Doing well.  Exercising.  Not requiring Meloxicam.    HEALTH MAINTENANCE.  Physical today.  Mammogram 05/15/12 - BiRADS I.  Schedule follow up mammogram.  Colonoscopy overdue.  She plans to call and reschedule.

## 2013-06-24 NOTE — Assessment & Plan Note (Addendum)
On thyroid replacement.  Check thyroid function tests.    

## 2013-06-24 NOTE — Assessment & Plan Note (Signed)
Blood pressure has been doing well.  Continue current medication regimen.  Check metabolic panel.

## 2013-06-24 NOTE — Assessment & Plan Note (Addendum)
Low cholesterol diet and exercise.  Follow lipid panel.  Has eaten today.  Unable to check lipid profile today.

## 2013-06-24 NOTE — Assessment & Plan Note (Addendum)
Using CPAP.  Follow.  Feels this is working well for her.   

## 2013-06-25 ENCOUNTER — Ambulatory Visit: Payer: Self-pay | Admitting: Internal Medicine

## 2013-06-28 ENCOUNTER — Encounter: Payer: Self-pay | Admitting: Internal Medicine

## 2013-06-28 ENCOUNTER — Telehealth: Payer: Self-pay | Admitting: Internal Medicine

## 2013-06-28 DIAGNOSIS — E876 Hypokalemia: Secondary | ICD-10-CM

## 2013-06-28 NOTE — Telephone Encounter (Signed)
Pt notified of lab results and the need for a f/u lab appt in 10 days.  Please schedule her for a non fasting lab appt in 10 days and call her with an appt date and time.   Thanks.

## 2013-06-28 NOTE — Telephone Encounter (Signed)
Appoinment 8/12  Sent pt my chart message letting her know about appointment

## 2013-06-30 ENCOUNTER — Encounter: Payer: Self-pay | Admitting: Internal Medicine

## 2013-07-01 ENCOUNTER — Encounter: Payer: Self-pay | Admitting: Internal Medicine

## 2013-07-02 ENCOUNTER — Encounter: Payer: Self-pay | Admitting: Internal Medicine

## 2013-07-03 ENCOUNTER — Encounter: Payer: Self-pay | Admitting: Internal Medicine

## 2013-07-03 MED ORDER — LEVOTHYROXINE SODIUM 175 MCG PO TABS: 175.0000 ug | ORAL_TABLET | Freq: Every day | ORAL | Status: DC

## 2013-07-03 MED ORDER — POTASSIUM CHLORIDE ER 10 MEQ PO TBCR: 10.0000 meq | EXTENDED_RELEASE_TABLET | Freq: Every day | ORAL | Status: DC

## 2013-07-03 NOTE — Telephone Encounter (Signed)
Refilled thyrid medicaiton #90 with 3 refills and ordered kcl #30 with one refill

## 2013-07-04 ENCOUNTER — Encounter: Payer: Self-pay | Admitting: Internal Medicine

## 2013-07-08 ENCOUNTER — Encounter: Payer: Self-pay | Admitting: Internal Medicine

## 2013-07-09 ENCOUNTER — Other Ambulatory Visit: Payer: BC Managed Care – PPO

## 2013-07-18 ENCOUNTER — Other Ambulatory Visit (INDEPENDENT_AMBULATORY_CARE_PROVIDER_SITE_OTHER): Payer: BC Managed Care – PPO

## 2013-07-18 DIAGNOSIS — E876 Hypokalemia: Secondary | ICD-10-CM

## 2013-07-18 LAB — POTASSIUM: Potassium: 3.2 mEq/L — ABNORMAL LOW (ref 3.5–5.1)

## 2013-07-21 ENCOUNTER — Encounter: Payer: Self-pay | Admitting: Internal Medicine

## 2013-07-21 ENCOUNTER — Telehealth: Payer: Self-pay | Admitting: Internal Medicine

## 2013-07-21 MED ORDER — POTASSIUM CHLORIDE ER 10 MEQ PO TBCR: 10.0000 meq | EXTENDED_RELEASE_TABLET | Freq: Two times a day (BID) | ORAL | Status: DC

## 2013-07-21 NOTE — Telephone Encounter (Signed)
Pt notified of lab results via my chart.  Was started on potassium.  Needs a follow up potassium check in 2 weeks.  Please schedule a non fasting lab appt in 2 weeks and call pt with appt date and time.  Thanks.

## 2013-07-22 NOTE — Telephone Encounter (Signed)
Appointment 9/8 °Pt aware °

## 2013-07-29 ENCOUNTER — Other Ambulatory Visit: Payer: Self-pay | Admitting: Internal Medicine

## 2013-08-02 ENCOUNTER — Telehealth: Payer: Self-pay | Admitting: *Deleted

## 2013-08-02 DIAGNOSIS — E876 Hypokalemia: Secondary | ICD-10-CM

## 2013-08-02 NOTE — Telephone Encounter (Signed)
Pt is coming in for labs on Monday 09.08.2014 what labs and dx?  

## 2013-08-02 NOTE — Telephone Encounter (Signed)
Order placed for potassium check.  

## 2013-08-05 ENCOUNTER — Other Ambulatory Visit (INDEPENDENT_AMBULATORY_CARE_PROVIDER_SITE_OTHER): Payer: BC Managed Care – PPO

## 2013-08-05 DIAGNOSIS — E876 Hypokalemia: Secondary | ICD-10-CM

## 2013-08-05 LAB — POTASSIUM: Potassium: 3.8 mEq/L (ref 3.5–5.1)

## 2013-08-06 ENCOUNTER — Encounter: Payer: Self-pay | Admitting: Internal Medicine

## 2013-08-06 ENCOUNTER — Telehealth: Payer: Self-pay | Admitting: Internal Medicine

## 2013-08-06 DIAGNOSIS — E876 Hypokalemia: Secondary | ICD-10-CM

## 2013-08-06 NOTE — Telephone Encounter (Signed)
Pt notified of lab results via my chart.  She needs a follow up lab appt within the next 2 -3 weeks.  Please schedule her for a non fasting lab appt in 2-3 weeks and notify her of appt date and time.    Thanks Dr Lorin Picket

## 2013-08-06 NOTE — Telephone Encounter (Signed)
Appointment 10/2 pt aware °

## 2013-08-29 ENCOUNTER — Other Ambulatory Visit (INDEPENDENT_AMBULATORY_CARE_PROVIDER_SITE_OTHER): Payer: BC Managed Care – PPO

## 2013-08-29 DIAGNOSIS — E876 Hypokalemia: Secondary | ICD-10-CM

## 2013-08-29 LAB — POTASSIUM: Potassium: 3.9 mEq/L (ref 3.5–5.1)

## 2013-08-30 ENCOUNTER — Encounter: Payer: Self-pay | Admitting: Internal Medicine

## 2013-09-11 ENCOUNTER — Encounter: Payer: Self-pay | Admitting: Nurse Practitioner

## 2013-09-11 ENCOUNTER — Ambulatory Visit (INDEPENDENT_AMBULATORY_CARE_PROVIDER_SITE_OTHER): Payer: BC Managed Care – PPO | Admitting: Nurse Practitioner

## 2013-09-11 VITALS — BP 120/78 | HR 96 | Ht 69.0 in | Wt 218.0 lb

## 2013-09-11 DIAGNOSIS — G40309 Generalized idiopathic epilepsy and epileptic syndromes, not intractable, without status epilepticus: Secondary | ICD-10-CM | POA: Insufficient documentation

## 2013-09-11 DIAGNOSIS — G40109 Localization-related (focal) (partial) symptomatic epilepsy and epileptic syndromes with simple partial seizures, not intractable, without status epilepticus: Secondary | ICD-10-CM | POA: Insufficient documentation

## 2013-09-11 DIAGNOSIS — Z5181 Encounter for therapeutic drug level monitoring: Secondary | ICD-10-CM | POA: Insufficient documentation

## 2013-09-11 DIAGNOSIS — G40119 Localization-related (focal) (partial) symptomatic epilepsy and epileptic syndromes with simple partial seizures, intractable, without status epilepticus: Secondary | ICD-10-CM

## 2013-09-11 DIAGNOSIS — Z79899 Other long term (current) drug therapy: Secondary | ICD-10-CM

## 2013-09-11 MED ORDER — CARBAMAZEPINE ER 200 MG PO CP12: 200.0000 mg | ORAL_CAPSULE | Freq: Two times a day (BID) | ORAL | Status: DC

## 2013-09-11 NOTE — Progress Notes (Signed)
I have read the note, and I agree with the clinical assessment and plan.  Jennifer Pratt KEITH   

## 2013-09-11 NOTE — Patient Instructions (Signed)
Will obtain CBZ level Will renew Carbamazepine for the next year Call for any seizure activity F/U yearly

## 2013-09-11 NOTE — Progress Notes (Signed)
[facility]  PATIENT: Jennifer Pratt DOB: Aug 16, 1961   REASON FOR VISIT: Followup for seizure disorder   HISTORY OF PRESENT ILLNESS:Ms Levada Schilling, 52 year old white female returns today for followup. 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 She is currently on generic Carbatrol twice daily without side effects to the drug and no seizure activity in several years. She was  switched by pharmacy to generic 2 yrs ago and has not had problems.    She was on Dilantin at one time but felt confused on the drug. She denies missing any doses of her medications, no daytime drowsiness or balance issues. No headaches.  She does exercise at least 3 times weekly. Reviewed recent labs at PCP.  No new neurologic symptoms.    REVIEW OF SYSTEMS: Full 14 system review of systems performed and notable only for:  Constitutional: N/A  Cardiovascular: N/A  Ear/Nose/Throat: N/A  Skin: N/A  Eyes: N/A  Respiratory: N/A  Gastroitestinal: N/A  Hematology/Lymphatic: N/A  Endocrine: N/A Musculoskeletal: Joint pain Allergy/Immunology: N/A  Neurological: N/A Psychiatric: N/A   ALLERGIES: Allergies  Allergen Reactions  . Lisinopril     HOME MEDICATIONS: Outpatient Prescriptions Prior to Visit  Medication Sig Dispense Refill  . carbamazepine (CARBATROL) 200 MG 12 hr capsule Take 200 mg by mouth 2 (two) times daily.      . DULoxetine (CYMBALTA) 30 MG capsule Take 1 capsule (30 mg total) by mouth daily.  90 capsule  1  . levothyroxine (SYNTHROID) 175 MCG tablet Take 1 tablet (175 mcg total) by mouth daily.  90 tablet  3  . Multiple Vitamin (MULTIVITAMIN) tablet Take 1 tablet by mouth daily.      . potassium chloride (K-DUR) 10 MEQ tablet Take 1 tablet (10 mEq total) by mouth 2 (two) times daily.  60 tablet  1  . triamterene-hydrochlorothiazide (MAXZIDE-25) 37.5-25 MG per tablet TAKE 1 TABLET BY MOUTH DAILY.  30 tablet  5  . valsartan (DIOVAN)  160 MG tablet TAKE 1 TABLET (160 MG TOTAL) BY MOUTH DAILY.  30 tablet  5  . omeprazole (PRILOSEC OTC) 20 MG tablet Take 20 mg by mouth daily.       No facility-administered medications prior to visit.    PAST MEDICAL HISTORY: Past Medical History  Diagnosis Date  . Hypothyroidism   . Hypertension   . Arthritis   . Seizures   . Ulcer     Gastric  . Hypercholesterolemia     PAST SURGICAL HISTORY: Past Surgical History  Procedure Laterality Date  . Thyroidectomy  1998  . Thumb surgery  2005  . Cesarean section    . Total knee arthroplasty Left 2012    FAMILY HISTORY: Family History  Problem Relation Age of Onset  . Diabetes Father   . Breast cancer Neg Hx   . Colon cancer Neg Hx     SOCIAL HISTORY: History   Social History  . Marital Status: Married    Spouse Name: N/A    Number of Children: 1  . Years of Education: Some college   Occupational History  . Clerical position    Social History Main Topics  . Smoking status: Former Smoker    Quit date: 01/09/1991  . Smokeless tobacco: Never Used  . Alcohol Use: Yes     Comment: Socially  . Drug Use: No  . Sexual Activity: Not on file   Other Topics Concern  . Not on file  Social History Narrative  . No narrative on file     PHYSICAL EXAM  Filed Vitals:   09/11/13 0837  BP: 120/78  Pulse: 96  Height: 5\' 9"  (1.753 m)  Weight: 218 lb (98.884 kg)   Body mass index is 32.18 kg/(m^2).  Generalized: Well developed, in no acute distress  Neurological examination   Mentation: Alert oriented to time, place, history taking. Follows all commands speech and language fluent  Cranial nerve II-XII: Pupils were equal round reactive to light extraocular movements were full, visual field were full on confrontational test. Facial sensation and strength were normal. hearing was intact to finger rubbing bilaterally. Uvula tongue midline. head turning and shoulder shrug and were normal and symmetric.Tongue protrusion  into cheek strength was normal. Motor: normal bulk and tone, full strength in the BUE, BLE, fine finger movements normal, no pronator drift. No focal weakness Coordination: finger-nose-finger, heel-to-shin bilaterally, no dysmetria Reflexes: 1+ upper and lower and symmetric Gait and Station: Rising up from seated position without assistance, normal stance,  moderate stride, good arm swing, smooth turning, able to perform tiptoe, and heel walking without difficulty. Tandem steady  DIAGNOSTIC DATA (LABS, IMAGING, TESTING) - I reviewed patient records, labs, notes, testing and imaging myself where available.  Lab Results  Component Value Date   WBC 5.0 06/24/2013   HGB 12.7 06/24/2013   HCT 37.1 06/24/2013   MCV 92.8 06/24/2013   PLT 244.0 06/24/2013      Component Value Date/Time   NA 140 06/24/2013 0842   K 3.9 08/29/2013 0808   CL 101 06/24/2013 0842   CO2 33* 06/24/2013 0842   GLUCOSE 99 06/24/2013 0842   BUN 17 06/24/2013 0842   CREATININE 0.8 06/24/2013 0842   CALCIUM 9.7 06/24/2013 0842   PROT 7.0 06/24/2013 0842   ALBUMIN 4.2 06/24/2013 0842   AST 20 06/24/2013 0842   ALT 22 06/24/2013 0842   ALKPHOS 68 06/24/2013 0842   BILITOT 0.4 06/24/2013 0842   GFRNONAA >60 08/12/2011 0512   GFRAA >60 08/12/2011 0512   Lab Results  Component Value Date   CHOL 219* 02/06/2013   HDL 57.20 02/06/2013   LDLDIRECT 138.2 02/06/2013   TRIG 204.0* 02/06/2013   CHOLHDL 4 02/06/2013    ASSESSMENT AND PLAN  52 y.o. year old female  has a past medical history of Hypothyroidism; Hypertension; Arthritis; Seizures; Ulcer; and Hypercholesterolemia. here here to followup with seizure disorder. No seizures in 6-7 years   Will obtain CBZ level Will renew Carbamazepine for the next year Call for any seizure activity F/U yearly Nilda Riggs, [facility], [facility], APRN  [facility] [address] [place], [place] [address] [phone]

## 2013-09-21 ENCOUNTER — Other Ambulatory Visit: Payer: Self-pay | Admitting: Internal Medicine

## 2013-10-03 ENCOUNTER — Other Ambulatory Visit: Payer: Self-pay

## 2013-11-11 ENCOUNTER — Telehealth: Payer: Self-pay | Admitting: Nurse Practitioner

## 2013-11-11 NOTE — Telephone Encounter (Signed)
Called patient and she is wanting to speak with Ms Daphine Deutscher to get her suggestions on a neurologist referral (from our practice)  for her mother.

## 2013-11-11 NOTE — Telephone Encounter (Signed)
TC to Ms Massaro, any of the MD's in our practice would be good. She says thank you

## 2013-11-13 ENCOUNTER — Telehealth: Payer: Self-pay | Admitting: *Deleted

## 2013-11-13 NOTE — Telephone Encounter (Signed)
Her mother's name is Nicoletta Dress (12/09/35)

## 2013-11-13 NOTE — Telephone Encounter (Signed)
See previous message

## 2013-11-13 NOTE — Telephone Encounter (Signed)
FYI: Pt walked in this morning asking to see Dr. Lorin Picket for a Z-pack. She was informed by Lupita Leash that Dr. Lorin Picket is with patients right now. Pt said " no she's not, I can see her right there". So, I brought the patient back to an exam room to get more information from her & to inform her that we could not send in a Z-pack and we are full today. Before I was able to get any information from her, she said "then that's okay I'll just go to Urgent Care" then threw a form at me & said "just let Dr. Lorin Picket know that I need this from completed for my mom."  Then patient stormed out of the room. The form did not have any name or it, so I have no clue who her mom is.

## 2013-11-13 NOTE — Telephone Encounter (Signed)
noted 

## 2013-12-25 ENCOUNTER — Other Ambulatory Visit: Payer: Self-pay | Admitting: Internal Medicine

## 2013-12-25 ENCOUNTER — Encounter: Payer: Self-pay | Admitting: Internal Medicine

## 2013-12-25 ENCOUNTER — Ambulatory Visit (INDEPENDENT_AMBULATORY_CARE_PROVIDER_SITE_OTHER): Payer: BC Managed Care – PPO | Admitting: Internal Medicine

## 2013-12-25 VITALS — BP 100/70 | HR 92 | Temp 98.7°F | Ht 69.0 in | Wt 226.5 lb

## 2013-12-25 DIAGNOSIS — E7800 Pure hypercholesterolemia, unspecified: Secondary | ICD-10-CM

## 2013-12-25 DIAGNOSIS — R220 Localized swelling, mass and lump, head: Secondary | ICD-10-CM

## 2013-12-25 DIAGNOSIS — E039 Hypothyroidism, unspecified: Secondary | ICD-10-CM

## 2013-12-25 DIAGNOSIS — I10 Essential (primary) hypertension: Secondary | ICD-10-CM

## 2013-12-25 DIAGNOSIS — G40909 Epilepsy, unspecified, not intractable, without status epilepticus: Secondary | ICD-10-CM

## 2013-12-25 DIAGNOSIS — R5383 Other fatigue: Secondary | ICD-10-CM

## 2013-12-25 DIAGNOSIS — G40309 Generalized idiopathic epilepsy and epileptic syndromes, not intractable, without status epilepticus: Secondary | ICD-10-CM

## 2013-12-25 DIAGNOSIS — K259 Gastric ulcer, unspecified as acute or chronic, without hemorrhage or perforation: Secondary | ICD-10-CM

## 2013-12-25 DIAGNOSIS — E876 Hypokalemia: Secondary | ICD-10-CM

## 2013-12-25 DIAGNOSIS — R5381 Other malaise: Secondary | ICD-10-CM

## 2013-12-25 DIAGNOSIS — R221 Localized swelling, mass and lump, neck: Secondary | ICD-10-CM

## 2013-12-25 DIAGNOSIS — G4733 Obstructive sleep apnea (adult) (pediatric): Secondary | ICD-10-CM

## 2013-12-25 LAB — COMPREHENSIVE METABOLIC PANEL
ALBUMIN: 4.6 g/dL (ref 3.5–5.2)
ALK PHOS: 72 U/L (ref 39–117)
ALT: 25 U/L (ref 0–35)
AST: 20 U/L (ref 0–37)
BUN: 19 mg/dL (ref 6–23)
CALCIUM: 9.8 mg/dL (ref 8.4–10.5)
CHLORIDE: 100 meq/L (ref 96–112)
CO2: 30 mEq/L (ref 19–32)
Creatinine, Ser: 1 mg/dL (ref 0.4–1.2)
GFR: 65.68 mL/min (ref >60.00)
GLUCOSE: 92 mg/dL (ref 70–99)
POTASSIUM: 3.6 meq/L (ref 3.5–5.1)
SODIUM: 139 meq/L (ref 135–145)
Total Bilirubin: 0.6 mg/dL (ref 0.3–1.2)
Total Protein: 7.8 g/dL (ref 6.0–8.3)

## 2013-12-25 LAB — VITAMIN B12: Vitamin B-12: 487 pg/mL (ref 211–911)

## 2013-12-25 LAB — TSH: TSH: 1.51 u[IU]/mL (ref 0.35–5.50)

## 2013-12-25 NOTE — Progress Notes (Signed)
Pre-visit discussion using our clinic review tool. No additional management support is needed unless otherwise documented below in the visit note.  

## 2013-12-25 NOTE — Progress Notes (Signed)
Orders placed for labs

## 2013-12-26 ENCOUNTER — Telehealth: Payer: Self-pay | Admitting: Internal Medicine

## 2013-12-26 ENCOUNTER — Encounter: Payer: Self-pay | Admitting: Internal Medicine

## 2013-12-26 DIAGNOSIS — E876 Hypokalemia: Secondary | ICD-10-CM

## 2013-12-26 NOTE — Telephone Encounter (Signed)
Pt notified of lab results via my chart.  Needs a non fasting lab in 2 weeks.  Please schedule and contact pt with a lab appt date and time.  Thanks.

## 2013-12-29 ENCOUNTER — Encounter: Payer: Self-pay | Admitting: Internal Medicine

## 2013-12-29 DIAGNOSIS — R221 Localized swelling, mass and lump, neck: Secondary | ICD-10-CM | POA: Insufficient documentation

## 2013-12-29 NOTE — Assessment & Plan Note (Signed)
Low cholesterol diet and exercise.  Follow lipid panel.   

## 2013-12-29 NOTE — Progress Notes (Signed)
Subjective:    Patient ID: Jennifer Pratt, female    DOB: 1961-04-03, 53 y.o.   MRN: [id number]  HPI 53 year old female with past history of hypertension, hypercholesterolemia, gastric ulcer and seizure disorder.  She comes in today for a scheduled follow up.    She states she is doing well.   No chest pain or tightness.  No sob.  Eating and drinking well.  Bowels stable.  She still reports some occasional fatigue, but overall feels good.  Sleeping well.  CPAP working well.  Blood pressure has been doing well.  No period since 7/15.  Has noticed a know on the left side of her neck.  Occasionally sore.  Present for 4-5 months.  Some stress with her mother's medical issues.  This has leveled off now.  Overall she feels she is doing relatively well.     Past Medical History  Diagnosis Date  . Hypothyroidism   . Hypertension   . Arthritis   . Seizures   . Ulcer     Gastric  . Hypercholesterolemia     Outpatient Encounter Prescriptions as of 12/25/2013  Medication Sig  . carbamazepine (CARBATROL) 200 MG 12 hr capsule Take 1 capsule (200 mg total) by mouth 2 (two) times daily.  . diclofenac (VOLTAREN) 75 MG EC tablet   . DULoxetine (CYMBALTA) 30 MG capsule Take 1 capsule (30 mg total) by mouth daily.  Marland Kitchen KLOR-CON 10 10 MEQ tablet TAKE 1 TABLET (10 MEQ TOTAL) BY MOUTH 2 (TWO) TIMES DAILY.  Marland Kitchen levothyroxine (SYNTHROID) 175 MCG tablet Take 1 tablet (175 mcg total) by mouth daily.  . meloxicam (MOBIC) 15 MG tablet   . Multiple Vitamin (MULTIVITAMIN) tablet Take 1 tablet by mouth daily.  Marland Kitchen triamterene-hydrochlorothiazide (MAXZIDE-25) 37.5-25 MG per tablet TAKE 1 TABLET BY MOUTH DAILY.  . valsartan (DIOVAN) 160 MG tablet TAKE 1 TABLET (160 MG TOTAL) BY MOUTH DAILY.  . [DISCONTINUED] KLOR-CON M10 10 MEQ tablet     Review of Systems Patient denies any headache, lightheadedness or dizziness.  No significant sinus or allergy symptoms.  No chest pain, tightness or palpitations.  No increased shortness  of breath, cough or congestion.  Breathing better.  No nausea or vomiting. No acid reflux.   No abdominal pain or cramping.  No bowel change, such as diarrhea, constipation, BRBPR or melana.  No urine change.  No period since 7/15.  Neck nodule as outlined.        Objective:   Physical Exam  Filed Vitals:   12/25/13 0808  BP: 100/70  Pulse: 92  Temp: 98.7 F (37.1 C)   Blood pressure recheck:  9/96  53 year old female in no acute distress.   HEENT:  Nares- clear.  Oropharynx - without lesions. NECK:  Supple.  Nontender.  No audible bruit.  Palpable nodule left posterior lateral neck.  No increased erythema.   HEART:  Appears to be regular. LUNGS:  No crackles or wheezing audible.  Respirations even and unlabored.  RADIAL PULSE:  Equal bilaterally.   ABDOMEN:  Soft, nontender.  Bowel sounds present and normal.  No audible abdominal bruit.   EXTREMITIES:  No increased edema present.  DP pulses palpable and equal bilaterally.          Assessment & Plan:  MSK.  Back on cymbalta.  Stable.    HEALTH MAINTENANCE.  Physical 06/24/13.   Mammogram 05/2913 - BiRADS I.   Colonoscopy overdue.  She plans to call and reschedule.

## 2013-12-29 NOTE — Assessment & Plan Note (Signed)
Currently stable.  Followed by Dr Willis and Carolyn Martin.  Off tramadol.    

## 2013-12-29 NOTE — Assessment & Plan Note (Signed)
Persistent nodule - neck.  Refer to ENT for evaluation.

## 2013-12-29 NOTE — Assessment & Plan Note (Signed)
On thyroid replacement.  Check thyroid function tests.    

## 2013-12-29 NOTE — Assessment & Plan Note (Signed)
Symptoms controlled.  On omeprazole.   

## 2013-12-29 NOTE — Assessment & Plan Note (Signed)
Blood pressure as outlined.  Will decrease triam/hctz to 1/2 tablet per day.   Check metabolic panel.  Will see if can decrease her potassium supplements as well.  Follow pressure.

## 2013-12-29 NOTE — Assessment & Plan Note (Signed)
Using CPAP.  Follow.  Feels this is working well for her.   

## 2013-12-29 NOTE — Assessment & Plan Note (Signed)
Decrease triam/hctz to 1/2 tablet per day.  Check metabolic panel.  Follow pressures.

## 2013-12-30 NOTE — Telephone Encounter (Signed)
Sent my chart message letting pt know about appointment on 2/16

## 2014-01-13 ENCOUNTER — Other Ambulatory Visit: Payer: BC Managed Care – PPO

## 2014-01-24 ENCOUNTER — Other Ambulatory Visit: Payer: BC Managed Care – PPO

## 2014-03-01 ENCOUNTER — Other Ambulatory Visit: Payer: Self-pay | Admitting: Internal Medicine

## 2014-03-04 ENCOUNTER — Other Ambulatory Visit: Payer: Self-pay | Admitting: Internal Medicine

## 2014-03-05 NOTE — Telephone Encounter (Signed)
Sent Rx to pharmacy  

## 2014-03-08 ENCOUNTER — Other Ambulatory Visit: Payer: Self-pay | Admitting: Internal Medicine

## 2014-06-03 ENCOUNTER — Other Ambulatory Visit: Payer: Self-pay | Admitting: Internal Medicine

## 2014-06-03 NOTE — Telephone Encounter (Signed)
See my previous note

## 2014-06-03 NOTE — Telephone Encounter (Signed)
Last refill 4.7.15, last OV 1.28.15, no future OV.  Please advise refill.

## 2014-06-03 NOTE — Telephone Encounter (Signed)
Needs an appt scheduled and then can call in refill until appt.  Please call pt and schedule appt before refilling.

## 2014-06-04 NOTE — Telephone Encounter (Signed)
Pt scheduled 7.9.15 at 10:30am

## 2014-06-04 NOTE — Telephone Encounter (Signed)
Noted.  Will refill then.  Thanks.

## 2014-06-05 ENCOUNTER — Encounter: Payer: Self-pay | Admitting: Internal Medicine

## 2014-06-05 ENCOUNTER — Ambulatory Visit (INDEPENDENT_AMBULATORY_CARE_PROVIDER_SITE_OTHER): Payer: BC Managed Care – PPO | Admitting: Internal Medicine

## 2014-06-05 VITALS — BP 90/60 | HR 93 | Temp 98.8°F | Ht 69.0 in | Wt 219.5 lb

## 2014-06-05 DIAGNOSIS — G40909 Epilepsy, unspecified, not intractable, without status epilepticus: Secondary | ICD-10-CM

## 2014-06-05 DIAGNOSIS — R221 Localized swelling, mass and lump, neck: Secondary | ICD-10-CM

## 2014-06-05 DIAGNOSIS — Z1239 Encounter for other screening for malignant neoplasm of breast: Secondary | ICD-10-CM

## 2014-06-05 DIAGNOSIS — E7800 Pure hypercholesterolemia, unspecified: Secondary | ICD-10-CM

## 2014-06-05 DIAGNOSIS — R200 Anesthesia of skin: Secondary | ICD-10-CM

## 2014-06-05 DIAGNOSIS — G4733 Obstructive sleep apnea (adult) (pediatric): Secondary | ICD-10-CM

## 2014-06-05 DIAGNOSIS — R209 Unspecified disturbances of skin sensation: Secondary | ICD-10-CM

## 2014-06-05 DIAGNOSIS — E039 Hypothyroidism, unspecified: Secondary | ICD-10-CM

## 2014-06-05 DIAGNOSIS — R220 Localized swelling, mass and lump, head: Secondary | ICD-10-CM

## 2014-06-05 DIAGNOSIS — I10 Essential (primary) hypertension: Secondary | ICD-10-CM

## 2014-06-05 DIAGNOSIS — E876 Hypokalemia: Secondary | ICD-10-CM

## 2014-06-05 DIAGNOSIS — K259 Gastric ulcer, unspecified as acute or chronic, without hemorrhage or perforation: Secondary | ICD-10-CM

## 2014-06-05 DIAGNOSIS — Z79899 Other long term (current) drug therapy: Secondary | ICD-10-CM

## 2014-06-05 LAB — BASIC METABOLIC PANEL
BUN: 19 mg/dL (ref 6–23)
CALCIUM: 10 mg/dL (ref 8.4–10.5)
CO2: 27 mEq/L (ref 19–32)
CREATININE: 0.9 mg/dL (ref 0.4–1.2)
Chloride: 102 mEq/L (ref 96–112)
GFR: 69.79 mL/min (ref >60.00)
GLUCOSE: 106 mg/dL — AB (ref 70–99)
Potassium: 3.7 mEq/L (ref 3.5–5.1)
Sodium: 138 mEq/L (ref 135–145)

## 2014-06-05 LAB — CBC WITH DIFFERENTIAL/PLATELET
BASOS ABS: 0 10*3/uL (ref 0.0–0.1)
Basophils Relative: 0.7 % (ref 0.0–3.0)
Eosinophils Absolute: 0.1 10*3/uL (ref 0.0–0.7)
Eosinophils Relative: 1.7 % (ref 0.0–5.0)
HCT: 39.8 % (ref 36.0–46.0)
Hemoglobin: 13.6 g/dL (ref 12.0–15.0)
LYMPHS PCT: 35.2 % (ref 12.0–46.0)
Lymphs Abs: 1.9 10*3/uL (ref 0.7–4.0)
MCHC: 34.2 g/dL (ref 30.0–36.0)
MCV: 89.2 fl (ref 78.0–100.0)
MONOS PCT: 6.6 % (ref 3.0–12.0)
Monocytes Absolute: 0.4 10*3/uL (ref 0.1–1.0)
Neutro Abs: 3 10*3/uL (ref 1.4–7.7)
Neutrophils Relative %: 55.8 % (ref 43.0–77.0)
PLATELETS: 269 10*3/uL (ref 150.0–400.0)
RBC: 4.46 Mil/uL (ref 3.87–5.11)
RDW: 12.9 % (ref 11.5–15.5)
WBC: 5.4 10*3/uL (ref 4.0–10.5)

## 2014-06-05 LAB — LIPID PANEL
CHOLESTEROL: 238 mg/dL — AB (ref 0–200)
HDL: 54.9 mg/dL (ref >39.00)
LDL Cholesterol: 131 mg/dL — ABNORMAL HIGH (ref 0–99)
NonHDL: 183.1
Total CHOL/HDL Ratio: 4
Triglycerides: 259 mg/dL — ABNORMAL HIGH (ref 0.0–149.0)
VLDL: 51.8 mg/dL — ABNORMAL HIGH (ref 0.0–40.0)

## 2014-06-05 LAB — HEPATIC FUNCTION PANEL
ALT: 22 U/L (ref 0–35)
AST: 17 U/L (ref 0–37)
Albumin: 4.5 g/dL (ref 3.5–5.2)
Alkaline Phosphatase: 70 U/L (ref 39–117)
BILIRUBIN TOTAL: 0.7 mg/dL (ref 0.2–1.2)
Bilirubin, Direct: 0 mg/dL (ref 0.0–0.3)
Total Protein: 7.4 g/dL (ref 6.0–8.3)

## 2014-06-05 LAB — TSH: TSH: 0.09 u[IU]/mL — ABNORMAL LOW (ref 0.35–4.50)

## 2014-06-05 LAB — VITAMIN B12: Vitamin B-12: >1500 pg/mL — ABNORMAL HIGH (ref 211–911)

## 2014-06-05 MED ORDER — DULOXETINE HCL 30 MG PO CPEP: 30.0000 mg | ORAL_CAPSULE | Freq: Every day | ORAL | Status: DC

## 2014-06-05 NOTE — Progress Notes (Signed)
Pre visit review using our clinic review tool, if applicable. No additional management support is needed unless otherwise documented below in the visit note. 

## 2014-06-08 ENCOUNTER — Other Ambulatory Visit: Payer: Self-pay | Admitting: Internal Medicine

## 2014-06-08 DIAGNOSIS — E039 Hypothyroidism, unspecified: Secondary | ICD-10-CM

## 2014-06-08 DIAGNOSIS — E7800 Pure hypercholesterolemia, unspecified: Secondary | ICD-10-CM

## 2014-06-08 DIAGNOSIS — R739 Hyperglycemia, unspecified: Secondary | ICD-10-CM

## 2014-06-08 NOTE — Progress Notes (Signed)
Order placed for f/u labs.  

## 2014-06-09 ENCOUNTER — Encounter: Payer: Self-pay | Admitting: Internal Medicine

## 2014-06-09 DIAGNOSIS — R200 Anesthesia of skin: Secondary | ICD-10-CM | POA: Insufficient documentation

## 2014-06-09 NOTE — Assessment & Plan Note (Signed)
Low cholesterol diet and exercise.  Follow lipid panel.   

## 2014-06-09 NOTE — Assessment & Plan Note (Signed)
Reports numbness - bottom of both feet.  Filament testing - appears to be normal.  She request referral to podiatry.  She will call back with name.  Follow.

## 2014-06-09 NOTE — Assessment & Plan Note (Signed)
On thyroid replacement.  Check thyroid function tests.

## 2014-06-09 NOTE — Assessment & Plan Note (Signed)
Using CPAP.  Follow.  Feels this is working well for her.

## 2014-06-09 NOTE — Assessment & Plan Note (Signed)
Blood pressure as outlined.  On triam/hctz 1/2 tablet per day.   Check metabolic panel.   Follow pressure.  Continue same medication regimen.

## 2014-06-09 NOTE — Assessment & Plan Note (Signed)
Potassium 12/26/13 wnl.  Recheck today.

## 2014-06-09 NOTE — Assessment & Plan Note (Signed)
Referred to ENT for evaluation. 

## 2014-06-09 NOTE — Assessment & Plan Note (Signed)
Currently stable.  Followed by Dr Jannifer Franklin and Cecille Rubin.  Off tramadol.

## 2014-06-09 NOTE — Assessment & Plan Note (Signed)
Symptoms controlled.  On omeprazole.   

## 2014-06-09 NOTE — Progress Notes (Signed)
Subjective:    Patient ID: Jennifer Pratt, female    DOB: [birth year], 53 y.o.   MRN: [id number]  HPI 53 year old female with past history of hypertension, hypercholesterolemia, gastric ulcer and seizure disorder.  She comes in today for a scheduled follow up.    She states she is doing well.   No chest pain or tightness.  No sob.  Eating and drinking well.  Bowels stable.  Reports last period was 6/14 and had minimal spotting 8/14.  Minimal hot flashes. Sleeping well.  CPAP working well.  Blood pressure has been doing well.  Mother is doing better.  Stress better.  Overall she feels she is doing relatively well.  She does report some numbness bottom of her feet (bilateral).  Request referral to podiatry.  She will call and make appt/or let me know name of MD she wants to see.     Past Medical History  Diagnosis Date  . Hypothyroidism   . Hypertension   . Arthritis   . Seizures   . Ulcer     Gastric  . Hypercholesterolemia     Outpatient Encounter Prescriptions as of 06/05/2014  Medication Sig  . carbamazepine (CARBATROL) 200 MG 12 hr capsule Take 1 capsule (200 mg total) by mouth 2 (two) times daily.  . diclofenac (VOLTAREN) 75 MG EC tablet   . DULoxetine (CYMBALTA) 30 MG capsule Take 1 capsule (30 mg total) by mouth daily.  Marland Kitchen levothyroxine (SYNTHROID, LEVOTHROID) 175 MCG tablet TAKE 1 TABLET BY MOUTH EVERY DAY  . meloxicam (MOBIC) 15 MG tablet   . potassium chloride (KLOR-CON M10) 10 MEQ tablet TAKE 1 TABLET BY MOUTH ONCE A DAY  . triamterene-hydrochlorothiazide (MAXZIDE-25) 37.5-25 MG per tablet TAKE 1/2 TABLET BY MOUTH DAILY.  . valsartan (DIOVAN) 160 MG tablet TAKE 1 TABLET (160 MG TOTAL) BY MOUTH DAILY.  . [DISCONTINUED] DULoxetine (CYMBALTA) 30 MG capsule TAKE 1 CAPSULE (30 MG TOTAL) BY MOUTH DAILY.  . [DISCONTINUED] KLOR-CON M10 10 MEQ tablet TAKE 1 TABLET BY MOUTH TWICE A DAY  . [DISCONTINUED] triamterene-hydrochlorothiazide (MAXZIDE-25) 37.5-25 MG per tablet TAKE 1 TABLET BY  MOUTH DAILY.  . [DISCONTINUED] Multiple Vitamin (MULTIVITAMIN) tablet Take 1 tablet by mouth daily.    Review of Systems Patient denies any headache, lightheadedness or dizziness.  No significant sinus or allergy symptoms.  No chest pain, tightness or palpitations.  No increased shortness of breath, cough or congestion.  Breathing better.  No nausea or vomiting. No acid reflux.   No abdominal pain or cramping.  No bowel change, such as diarrhea, constipation, BRBPR or melana.  No urine change.  No period since 6/14.  Some spotting 8/14.  Numbness bilateral feet as outlined.        Objective:   Physical Exam  Filed Vitals:   06/05/14 1026  BP: 90/60  Pulse: 93  Temp: 98.8 F (37.1 C)   Blood pressure recheck:  38/67  53 year old female in no acute distress.   HEENT:  Nares- clear.  Oropharynx - without lesions. NECK:  Supple.  Nontender.  No audible bruit.  HEART:  Appears to be regular. LUNGS:  No crackles or wheezing audible.  Respirations even and unlabored.  RADIAL PULSE:  Equal bilaterally.   ABDOMEN:  Soft, nontender.  Bowel sounds present and normal.  No audible abdominal bruit.   EXTREMITIES:  No increased edema present.  DP pulses palpable and equal bilaterally.          Assessment &  Plan:  MSK.  Back on cymbalta.  Stable.    HEALTH MAINTENANCE.  Physical 06/24/13.   Mammogram 05/2913 - BiRADS I.   Colonoscopy overdue.  She planned to call and reschedule.

## 2014-06-10 ENCOUNTER — Encounter: Payer: Self-pay | Admitting: *Deleted

## 2014-06-10 ENCOUNTER — Other Ambulatory Visit: Payer: Self-pay | Admitting: *Deleted

## 2014-06-10 ENCOUNTER — Other Ambulatory Visit: Payer: Self-pay | Admitting: Internal Medicine

## 2014-06-10 DIAGNOSIS — E039 Hypothyroidism, unspecified: Secondary | ICD-10-CM

## 2014-06-10 MED ORDER — LEVOTHYROXINE SODIUM 150 MCG PO TABS: ORAL_TABLET | ORAL | Status: DC

## 2014-06-10 NOTE — Progress Notes (Signed)
Order placed for free T3 and free T4.   

## 2014-07-24 ENCOUNTER — Other Ambulatory Visit: Payer: BC Managed Care – PPO

## 2014-07-24 ENCOUNTER — Other Ambulatory Visit (INDEPENDENT_AMBULATORY_CARE_PROVIDER_SITE_OTHER): Payer: BC Managed Care – PPO

## 2014-07-24 DIAGNOSIS — R7309 Other abnormal glucose: Secondary | ICD-10-CM

## 2014-07-24 DIAGNOSIS — E7800 Pure hypercholesterolemia, unspecified: Secondary | ICD-10-CM

## 2014-07-24 DIAGNOSIS — E039 Hypothyroidism, unspecified: Secondary | ICD-10-CM

## 2014-07-24 DIAGNOSIS — R739 Hyperglycemia, unspecified: Secondary | ICD-10-CM

## 2014-07-24 DIAGNOSIS — E876 Hypokalemia: Secondary | ICD-10-CM

## 2014-07-25 LAB — TSH: TSH: 1.82 u[IU]/mL (ref 0.35–4.50)

## 2014-07-25 LAB — HEPATIC FUNCTION PANEL
ALT: 19 U/L (ref 0–35)
AST: 19 U/L (ref 0–37)
Albumin: 4.1 g/dL (ref 3.5–5.2)
Alkaline Phosphatase: 83 U/L (ref 39–117)
Bilirubin, Direct: 0.1 mg/dL (ref 0.0–0.3)
TOTAL PROTEIN: 6.9 g/dL (ref 6.0–8.3)
Total Bilirubin: 0.4 mg/dL (ref 0.2–1.2)

## 2014-07-25 LAB — T4, FREE: Free T4: 0.67 ng/dL (ref 0.60–1.60)

## 2014-07-25 LAB — LIPID PANEL
Cholesterol: 228 mg/dL — ABNORMAL HIGH (ref 0–200)
HDL: 54.9 mg/dL (ref >39.00)
NONHDL: 173.1
Total CHOL/HDL Ratio: 4
Triglycerides: 382 mg/dL — ABNORMAL HIGH (ref 0.0–149.0)
VLDL: 76.4 mg/dL — ABNORMAL HIGH (ref 0.0–40.0)

## 2014-07-25 LAB — HEMOGLOBIN A1C: HEMOGLOBIN A1C: 5.5 % (ref 4.6–6.5)

## 2014-07-25 LAB — LDL CHOLESTEROL, DIRECT: Direct LDL: 143.6 mg/dL

## 2014-07-25 LAB — POTASSIUM: Potassium: 3.5 mEq/L (ref 3.5–5.1)

## 2014-07-25 LAB — T3, FREE: T3 FREE: 2.4 pg/mL (ref 2.3–4.2)

## 2014-07-25 LAB — GLUCOSE, FASTING: GLUCOSE, FASTING: 87 mg/dL (ref 70–99)

## 2014-07-26 ENCOUNTER — Encounter: Payer: Self-pay | Admitting: Internal Medicine

## 2014-07-26 DIAGNOSIS — E7800 Pure hypercholesterolemia, unspecified: Secondary | ICD-10-CM

## 2014-07-28 NOTE — Telephone Encounter (Signed)
Unread mychart message mailed  

## 2014-07-31 ENCOUNTER — Telehealth: Payer: Self-pay | Admitting: Internal Medicine

## 2014-07-31 MED ORDER — PRAVASTATIN SODIUM 10 MG PO TABS: 10.0000 mg | ORAL_TABLET | Freq: Every day | ORAL | Status: DC

## 2014-07-31 NOTE — Telephone Encounter (Signed)
Pt notified needs non fasting lab appt in 6 weeks.  Please schedule and contact her with a lab appt date and time.  Thanks.

## 2014-07-31 NOTE — Telephone Encounter (Signed)
rx sent in for pravastatin.  Needs f/u liver panel in 6 weeks.  Ordered.  Pt notified via my chart.

## 2014-07-31 NOTE — Addendum Note (Signed)
Addended by: Alisa Graff on: 07/31/2014 01:14 PM   Modules accepted: Orders

## 2014-09-11 ENCOUNTER — Ambulatory Visit: Payer: BC Managed Care – PPO | Admitting: Nurse Practitioner

## 2014-09-12 ENCOUNTER — Other Ambulatory Visit: Payer: BC Managed Care – PPO

## 2014-09-17 ENCOUNTER — Encounter: Payer: Self-pay | Admitting: *Deleted

## 2014-09-17 ENCOUNTER — Ambulatory Visit: Payer: Self-pay | Admitting: Internal Medicine

## 2014-09-17 ENCOUNTER — Encounter: Payer: Self-pay | Admitting: Internal Medicine

## 2014-09-17 ENCOUNTER — Other Ambulatory Visit (INDEPENDENT_AMBULATORY_CARE_PROVIDER_SITE_OTHER): Payer: BC Managed Care – PPO

## 2014-09-17 ENCOUNTER — Telehealth: Payer: Self-pay | Admitting: Internal Medicine

## 2014-09-17 DIAGNOSIS — E7800 Pure hypercholesterolemia, unspecified: Secondary | ICD-10-CM

## 2014-09-17 LAB — HEPATIC FUNCTION PANEL
ALBUMIN: 4 g/dL (ref 3.5–5.2)
ALK PHOS: 82 U/L (ref 39–117)
ALT: 20 U/L (ref 0–35)
AST: 21 U/L (ref 0–37)
BILIRUBIN DIRECT: 0 mg/dL (ref 0.0–0.3)
Total Bilirubin: 0.5 mg/dL (ref 0.2–1.2)
Total Protein: 7.7 g/dL (ref 6.0–8.3)

## 2014-09-17 LAB — HM MAMMOGRAPHY: HM Mammogram: NEGATIVE

## 2014-09-17 NOTE — Telephone Encounter (Signed)
Pt is overdue a physical.  Need to schedule

## 2014-09-19 NOTE — Telephone Encounter (Signed)
Unread mychart message mailed to patient 

## 2014-09-24 ENCOUNTER — Telehealth: Payer: Self-pay | Admitting: *Deleted

## 2014-09-24 NOTE — Telephone Encounter (Signed)
Patient called back, scheduled patient with Dr. Jannifer Franklin on 10/02/14 since [facility] had no openings.

## 2014-09-24 NOTE — Telephone Encounter (Signed)
Left patient a message that her appt scheduled for 09-29-14 was canceled. Patient needs to call back and r/s appt.

## 2014-09-29 ENCOUNTER — Ambulatory Visit: Payer: BC Managed Care – PPO | Admitting: Nurse Practitioner

## 2014-09-30 ENCOUNTER — Other Ambulatory Visit: Payer: Self-pay

## 2014-09-30 MED ORDER — CARBAMAZEPINE ER 200 MG PO CP12: 200.0000 mg | ORAL_CAPSULE | Freq: Two times a day (BID) | ORAL | Status: DC

## 2014-10-02 ENCOUNTER — Encounter: Payer: Self-pay | Admitting: Neurology

## 2014-10-02 ENCOUNTER — Ambulatory Visit (INDEPENDENT_AMBULATORY_CARE_PROVIDER_SITE_OTHER): Payer: BC Managed Care – PPO | Admitting: Neurology

## 2014-10-02 VITALS — BP 112/83 | HR 94 | Ht 69.0 in | Wt 228.6 lb

## 2014-10-02 DIAGNOSIS — Z5181 Encounter for therapeutic drug level monitoring: Secondary | ICD-10-CM

## 2014-10-02 DIAGNOSIS — G40909 Epilepsy, unspecified, not intractable, without status epilepticus: Secondary | ICD-10-CM

## 2014-10-02 MED ORDER — CARBAMAZEPINE ER 200 MG PO CP12: 200.0000 mg | ORAL_CAPSULE | Freq: Two times a day (BID) | ORAL | Status: DC

## 2014-10-02 NOTE — Patient Instructions (Signed)

## 2014-10-02 NOTE — Progress Notes (Signed)
Reason for visit: seizures  Jennifer Pratt is an 53 y.o. female  History of present illness:  Jennifer Pratt is a 53 year old right-handed white female with a history of seizures, well controlled on Carbatrol. The patient indicates that her last seizure event was 14 years ago. The patient has not had any seizures since being on Carbatrol. Her seizures used to be associated with her menstrual cycle, but she has not had a period in over one year. The patient has recently had a lot of problems with low back pain and sciatica bilaterally. The patient was told she had spinal stenosis. She has some numbness and tingling in the feet associated with this issue. The patient questions whether she could be switched to gabapentin or Lyrica and control her seizures and treat her back pain. She continues to operate a motor vehicle. No other new significant medical issues have come up since last seen.  Past Medical History  Diagnosis Date  . Hypothyroidism   . Hypertension   . Arthritis   . Seizures   . Ulcer     Gastric  . Hypercholesterolemia     Past Surgical History  Procedure Laterality Date  . Thyroidectomy  1998  . Thumb surgery  2005  . Cesarean section    . Total knee arthroplasty Left 2012    Family History  Problem Relation Age of Onset  . Diabetes Father   . Breast cancer Neg Hx   . Colon cancer Neg Hx     Social history:  reports that she quit smoking about 23 years ago. She has never used smokeless tobacco. She reports that she drinks alcohol. She reports that she does not use illicit drugs.    Allergies  Allergen Reactions  . Lisinopril     Medications:  Current Outpatient Prescriptions on File Prior to Visit  Medication Sig Dispense Refill  . diclofenac (VOLTAREN) 75 MG EC tablet     . DULoxetine (CYMBALTA) 30 MG capsule Take 1 capsule (30 mg total) by mouth daily. 30 capsule 3  . levothyroxine (SYNTHROID, LEVOTHROID) 150 MCG tablet TAKE 1 TABLET BY MOUTH EVERY  DAY 90 tablet 0  . meloxicam (MOBIC) 15 MG tablet     . potassium chloride (KLOR-CON M10) 10 MEQ tablet TAKE 1 TABLET BY MOUTH ONCE A DAY    . pravastatin (PRAVACHOL) 10 MG tablet Take 1 tablet (10 mg total) by mouth daily. 30 tablet 2  . triamterene-hydrochlorothiazide (MAXZIDE-25) 37.5-25 MG per tablet TAKE 1/2 TABLET BY MOUTH DAILY.    . valsartan (DIOVAN) 160 MG tablet TAKE 1 TABLET (160 MG TOTAL) BY MOUTH DAILY. 30 tablet 5  . [DISCONTINUED] KLOR-CON 10 10 MEQ tablet TAKE 1 TABLET (10 MEQ TOTAL) BY MOUTH 2 (TWO) TIMES DAILY. 60 tablet 3   No current facility-administered medications on file prior to visit.    ROS:  Out of a complete 14 system review of symptoms, the patient complains only of the following symptoms, and all other reviewed systems are negative.  Joint pain, back pain Numbness in the feet  Blood pressure 112/83, pulse 94, height 5\' 9"  (1.753 m), weight 228 lb 9.6 oz (103.692 kg), last menstrual period 10/09/2012.  Physical Exam  General: The patient is alert and cooperative at the time of the examination. The patient is moderately obese.  Skin: No significant peripheral edema is noted.   Neurologic Exam  Mental status: The patient is oriented x 3.  Cranial nerves: Facial symmetry is present. Speech  is normal, no aphasia or dysarthria is noted. Extraocular movements are full. Visual fields are full.  Motor: The patient has good strength in all 4 extremities.  Sensory examination: Soft touch sensation is symmetric on the face, arms, and legs.  Coordination: The patient has good finger-nose-finger and heel-to-shin bilaterally.  Gait and station: The patient has a normal gait. Tandem gait is normal. Romberg is negative. No drift is seen.  Reflexes: Deep tendon reflexes are symmetric.   Assessment/Plan:  1. History seizures, well controlled  2. Lumbosacral spinal stenosis  The patient is doing quite well at this time with her seizures. She may decide to  switch over to Lyrica to help both the back and the seizure issue. For now, she will stay on the Carbatrol. She will have a blood level checked today. She has already had a CBC and liver profile done in July 2015.she will follow-up in one year.  Jill Alexanders MD 10/02/2014 2:47 PM  [facility] [address] [place], [place] [address]  Phone [phone] Fax [phone]

## 2014-10-03 LAB — CARBAMAZEPINE LEVEL, TOTAL: Carbamazepine Lvl: 6.6 ug/mL (ref 4.0–12.0)

## 2014-10-16 ENCOUNTER — Other Ambulatory Visit: Payer: Self-pay | Admitting: Internal Medicine

## 2014-10-26 ENCOUNTER — Other Ambulatory Visit: Payer: Self-pay | Admitting: Internal Medicine

## 2014-10-28 ENCOUNTER — Other Ambulatory Visit: Payer: Self-pay | Admitting: Internal Medicine

## 2014-11-13 ENCOUNTER — Other Ambulatory Visit: Payer: Self-pay | Admitting: Internal Medicine

## 2014-12-09 ENCOUNTER — Encounter: Payer: Self-pay | Admitting: Internal Medicine

## 2014-12-09 ENCOUNTER — Ambulatory Visit (INDEPENDENT_AMBULATORY_CARE_PROVIDER_SITE_OTHER): Payer: BLUE CROSS/BLUE SHIELD | Admitting: *Deleted

## 2014-12-09 ENCOUNTER — Ambulatory Visit (INDEPENDENT_AMBULATORY_CARE_PROVIDER_SITE_OTHER): Payer: BLUE CROSS/BLUE SHIELD | Admitting: Internal Medicine

## 2014-12-09 VITALS — BP 118/80 | HR 88 | Temp 98.8°F | Ht 68.0 in | Wt 231.5 lb

## 2014-12-09 DIAGNOSIS — E2839 Other primary ovarian failure: Secondary | ICD-10-CM

## 2014-12-09 DIAGNOSIS — I10 Essential (primary) hypertension: Secondary | ICD-10-CM

## 2014-12-09 DIAGNOSIS — E039 Hypothyroidism, unspecified: Secondary | ICD-10-CM

## 2014-12-09 DIAGNOSIS — R739 Hyperglycemia, unspecified: Secondary | ICD-10-CM

## 2014-12-09 DIAGNOSIS — E876 Hypokalemia: Secondary | ICD-10-CM

## 2014-12-09 DIAGNOSIS — E7800 Pure hypercholesterolemia, unspecified: Secondary | ICD-10-CM

## 2014-12-09 DIAGNOSIS — Z23 Encounter for immunization: Secondary | ICD-10-CM

## 2014-12-09 DIAGNOSIS — R1084 Generalized abdominal pain: Secondary | ICD-10-CM

## 2014-12-09 DIAGNOSIS — G40909 Epilepsy, unspecified, not intractable, without status epilepticus: Secondary | ICD-10-CM

## 2014-12-09 DIAGNOSIS — I6529 Occlusion and stenosis of unspecified carotid artery: Secondary | ICD-10-CM

## 2014-12-09 DIAGNOSIS — G4733 Obstructive sleep apnea (adult) (pediatric): Secondary | ICD-10-CM

## 2014-12-09 NOTE — Progress Notes (Signed)
Pre visit review using our clinic review tool, if applicable. No additional management support is needed unless otherwise documented below in the visit note. 

## 2014-12-14 ENCOUNTER — Encounter: Payer: Self-pay | Admitting: Internal Medicine

## 2014-12-14 DIAGNOSIS — R109 Unspecified abdominal pain: Secondary | ICD-10-CM | POA: Insufficient documentation

## 2014-12-14 NOTE — Progress Notes (Signed)
Subjective:    Patient ID: Jennifer Pratt, female    DOB: 1961/11/05, 54 y.o.   MRN: [id number]  HPI 54 year old female with past history of hypertension, hypercholesterolemia, gastric ulcer and seizure disorder.  She comes in today to follow up on these issues as well as for a complete physical exam.    She states she is doing well.   No chest pain or tightness.  No sob.  Eating and drinking well.  Bowels stable. Reports last period was 6/14 and had minimal spotting 8/14.  Minimal hot flashes.  CPAP working well.  Blood pressure has been doing well.  Increased stress.  Overall she feels she is doing relatively well.  Had physical therapy for her back.  Seeing Dr Jacelyn Grip.  S/p injection.  Doing exercise.  She also reports some abdominal pain and lower pelvic pain/discomfort.  Intermittent.  No bowel change.     Past Medical History  Diagnosis Date  . Hypothyroidism   . Hypertension   . Arthritis   . Seizures   . Ulcer     Gastric  . Hypercholesterolemia     Outpatient Encounter Prescriptions as of 12/09/2014  Medication Sig  . carbamazepine (CARBATROL) 200 MG 12 hr capsule Take 1 capsule (200 mg total) by mouth 2 (two) times daily.  . diclofenac (VOLTAREN) 75 MG EC tablet   . DULoxetine (CYMBALTA) 30 MG capsule TAKE 1 CAPSULE (30 MG TOTAL) BY MOUTH DAILY.  Marland Kitchen KLOR-CON M10 10 MEQ tablet TAKE 1 TABLET BY MOUTH TWICE A DAY  . levothyroxine (SYNTHROID, LEVOTHROID) 150 MCG tablet TAKE 1 TABLET BY MOUTH EVERY DAY  . pravastatin (PRAVACHOL) 10 MG tablet TAKE 1 TABLET (10 MG TOTAL) BY MOUTH DAILY.  Marland Kitchen triamterene-hydrochlorothiazide (MAXZIDE-25) 37.5-25 MG per tablet TAKE 1/2 TABLET BY MOUTH DAILY.  . valsartan (DIOVAN) 160 MG tablet TAKE 1 TABLET (160 MG TOTAL) BY MOUTH DAILY.  . [DISCONTINUED] DULoxetine (CYMBALTA) 30 MG capsule TAKE 1 CAPSULE (30 MG TOTAL) BY MOUTH DAILY.  . [DISCONTINUED] meloxicam (MOBIC) 15 MG tablet   . [DISCONTINUED] potassium chloride (KLOR-CON M10) 10 MEQ tablet TAKE 1  TABLET BY MOUTH ONCE A DAY    Review of Systems Patient denies any headache, lightheadedness or dizziness.  No significant sinus or allergy symptoms.  No chest pain, tightness or palpitations.  No increased shortness of breath, cough or congestion.  Breathing better.  No nausea or vomiting. No acid reflux.  Abdominal pain or lower pelvic pain/discomfort.   No bowel change, such as diarrhea, constipation, BRBPR or melana.  No urine change.  No period since 6/14.  Some spotting 8/14.  Brings in report from outside carotid ultrasound.  Needs f/u.  Some mild stenosis.       Objective:   Physical Exam  Filed Vitals:   12/09/14 1343  BP: 118/80  Pulse: 88  Temp: 98.8 F (37.1 C)   Blood pressure recheck:  30/69  54 year old female in no acute distress.   HEENT:  Nares- clear.  Oropharynx - without lesions. NECK:  Supple.  Nontender.  No audible bruit.  HEART:  Appears to be regular. LUNGS:  No crackles or wheezing audible.  Respirations even and unlabored.  RADIAL PULSE:  Equal bilaterally.    BREASTS:  No nipple discharge or nipple retraction present.  Could not appreciate any distinct nodules or axillary adenopathy.  ABDOMEN:  Soft.  Minimal tenderness to palpation over the abdomen and lower pelvic region.   Bowel sounds present  and normal.  No audible abdominal bruit.  GU:  Not performed.     EXTREMITIES:  No increased edema present.  DP pulses palpable and equal bilaterally.          Assessment & Plan:  1. Essential hypertension, benign Blood pressure doing well.  Follow.   - Basic metabolic panel; Future  2. Obstructive sleep apnea Using CPAP.   3. Hypothyroidism, unspecified hypothyroidism type On thyroid replacement.  Follow tsh.  - TSH; Future  4. Hypercholesterolemia Low cholesterol diet and exercise.  On pravastatin.  - Lipid panel; Future - Hepatic function panel; Future  5. Hypokalemia Taking potassium supplements.  Follow potassium.    6. Seizure  disorder Seeing neurology.  No seizures on current regimen.    7. Generalized abdominal pain Abdomen pain and lower pelvic pain.  Schedule abdominal ultrasound and pelvic ultrasound.   - US Abdomen Complete; Future - US Pelvis Complete; Future  8. Hyperglycemia Low carb diet and exercise.   - Hemoglobin A1c; Future  9. Carotid stenosis, unspecified laterality Carotid stenosis from previous check.  Needs f/u carotid ultrasound.  - Ambulatory referral to Vascular Surgery  10. Estrogen deficiency - FSH; Future  11. MSK.  On cymbalta.  Stable.  S/p physical therapy.  Exercise.  Follow.    HEALTH MAINTENANCE.  Physical today.   Mammogram 09/17/14 - BiRADS I.   Colonoscopy overdue.  She planned to call and reschedule.

## 2014-12-15 ENCOUNTER — Encounter: Payer: Self-pay | Admitting: Internal Medicine

## 2014-12-17 ENCOUNTER — Ambulatory Visit: Payer: Self-pay | Admitting: Internal Medicine

## 2014-12-21 ENCOUNTER — Telehealth: Payer: Self-pay | Admitting: Internal Medicine

## 2014-12-21 NOTE — Telephone Encounter (Signed)
Pt notified of abdominal ultrasound and pelvic ultrasound results.  No acute abnormality.  Uterine fibroid.

## 2014-12-22 ENCOUNTER — Other Ambulatory Visit: Payer: BLUE CROSS/BLUE SHIELD

## 2014-12-24 ENCOUNTER — Other Ambulatory Visit (INDEPENDENT_AMBULATORY_CARE_PROVIDER_SITE_OTHER): Payer: BLUE CROSS/BLUE SHIELD

## 2014-12-24 DIAGNOSIS — E7800 Pure hypercholesterolemia, unspecified: Secondary | ICD-10-CM

## 2014-12-24 DIAGNOSIS — R739 Hyperglycemia, unspecified: Secondary | ICD-10-CM

## 2014-12-24 DIAGNOSIS — E2839 Other primary ovarian failure: Secondary | ICD-10-CM

## 2014-12-24 DIAGNOSIS — E039 Hypothyroidism, unspecified: Secondary | ICD-10-CM

## 2014-12-24 DIAGNOSIS — I10 Essential (primary) hypertension: Secondary | ICD-10-CM

## 2014-12-24 LAB — HEPATIC FUNCTION PANEL
ALBUMIN: 4.3 g/dL (ref 3.5–5.2)
ALT: 21 U/L (ref 0–35)
AST: 16 U/L (ref 0–37)
Alkaline Phosphatase: 74 U/L (ref 39–117)
BILIRUBIN TOTAL: 0.3 mg/dL (ref 0.2–1.2)
Bilirubin, Direct: 0.1 mg/dL (ref 0.0–0.3)
TOTAL PROTEIN: 6.8 g/dL (ref 6.0–8.3)

## 2014-12-24 LAB — BASIC METABOLIC PANEL
BUN: 19 mg/dL (ref 6–23)
CALCIUM: 9.5 mg/dL (ref 8.4–10.5)
CO2: 30 meq/L (ref 19–32)
Chloride: 105 mEq/L (ref 96–112)
Creatinine, Ser: 0.85 mg/dL (ref 0.40–1.20)
GFR: 74.39 mL/min (ref >60.00)
Glucose, Bld: 100 mg/dL — ABNORMAL HIGH (ref 70–99)
Potassium: 4.4 mEq/L (ref 3.5–5.1)
Sodium: 141 mEq/L (ref 135–145)

## 2014-12-24 LAB — LIPID PANEL
Cholesterol: 217 mg/dL — ABNORMAL HIGH (ref 0–200)
HDL: 55.2 mg/dL (ref >39.00)
LDL Cholesterol: 131 mg/dL — ABNORMAL HIGH (ref 0–99)
NONHDL: 161.8
TRIGLYCERIDES: 156 mg/dL — AB (ref 0.0–149.0)
Total CHOL/HDL Ratio: 4
VLDL: 31.2 mg/dL (ref 0.0–40.0)

## 2014-12-24 LAB — TSH: TSH: 3.05 u[IU]/mL (ref 0.35–4.50)

## 2014-12-24 LAB — FOLLICLE STIMULATING HORMONE: FSH: 100.3 m[IU]/mL

## 2014-12-24 LAB — HEMOGLOBIN A1C: Hgb A1c MFr Bld: 5.4 % (ref 4.6–6.5)

## 2014-12-25 ENCOUNTER — Encounter: Payer: Self-pay | Admitting: Internal Medicine

## 2014-12-26 NOTE — Telephone Encounter (Signed)
Unread mychart message mailed to patient 

## 2014-12-29 NOTE — Telephone Encounter (Signed)
Unread mychart message mailed to patient 

## 2015-01-08 ENCOUNTER — Encounter: Payer: Self-pay | Admitting: Internal Medicine

## 2015-02-07 ENCOUNTER — Other Ambulatory Visit: Payer: Self-pay | Admitting: Internal Medicine

## 2015-03-08 ENCOUNTER — Other Ambulatory Visit: Payer: Self-pay | Admitting: Internal Medicine

## 2015-05-12 ENCOUNTER — Other Ambulatory Visit: Payer: Self-pay | Admitting: Internal Medicine

## 2015-06-02 ENCOUNTER — Encounter: Payer: Self-pay | Admitting: Internal Medicine

## 2015-06-02 DIAGNOSIS — G40909 Epilepsy, unspecified, not intractable, without status epilepticus: Secondary | ICD-10-CM

## 2015-06-02 DIAGNOSIS — E7800 Pure hypercholesterolemia, unspecified: Secondary | ICD-10-CM

## 2015-06-02 DIAGNOSIS — I10 Essential (primary) hypertension: Secondary | ICD-10-CM

## 2015-06-02 DIAGNOSIS — E039 Hypothyroidism, unspecified: Secondary | ICD-10-CM

## 2015-06-02 NOTE — Telephone Encounter (Signed)
I have placed the order for the labs.  Please schedule her a fasting lab appt.  Thanks

## 2015-06-02 NOTE — Telephone Encounter (Signed)
Orders placed for labs

## 2015-06-04 ENCOUNTER — Encounter: Payer: Self-pay | Admitting: Internal Medicine

## 2015-06-04 ENCOUNTER — Other Ambulatory Visit (INDEPENDENT_AMBULATORY_CARE_PROVIDER_SITE_OTHER): Payer: BLUE CROSS/BLUE SHIELD

## 2015-06-04 DIAGNOSIS — E7800 Pure hypercholesterolemia, unspecified: Secondary | ICD-10-CM

## 2015-06-04 DIAGNOSIS — I10 Essential (primary) hypertension: Secondary | ICD-10-CM

## 2015-06-04 DIAGNOSIS — E039 Hypothyroidism, unspecified: Secondary | ICD-10-CM | POA: Diagnosis not present

## 2015-06-04 DIAGNOSIS — G40909 Epilepsy, unspecified, not intractable, without status epilepticus: Secondary | ICD-10-CM | POA: Diagnosis not present

## 2015-06-04 LAB — BASIC METABOLIC PANEL
BUN: 17 mg/dL (ref 6–23)
CHLORIDE: 100 meq/L (ref 96–112)
CO2: 34 mEq/L — ABNORMAL HIGH (ref 19–32)
Calcium: 9.9 mg/dL (ref 8.4–10.5)
Creatinine, Ser: 0.88 mg/dL (ref 0.40–1.20)
GFR: 71.35 mL/min (ref >60.00)
Glucose, Bld: 90 mg/dL (ref 70–99)
POTASSIUM: 3.4 meq/L — AB (ref 3.5–5.1)
Sodium: 142 mEq/L (ref 135–145)

## 2015-06-04 LAB — CBC WITH DIFFERENTIAL/PLATELET
BASOS PCT: 0.8 % (ref 0.0–3.0)
Basophils Absolute: 0 10*3/uL (ref 0.0–0.1)
EOS ABS: 0.1 10*3/uL (ref 0.0–0.7)
EOS PCT: 1.5 % (ref 0.0–5.0)
HEMATOCRIT: 40.7 % (ref 36.0–46.0)
HEMOGLOBIN: 14 g/dL (ref 12.0–15.0)
LYMPHS ABS: 2.1 10*3/uL (ref 0.7–4.0)
Lymphocytes Relative: 40.7 % (ref 12.0–46.0)
MCHC: 34.4 g/dL (ref 30.0–36.0)
MCV: 89.2 fl (ref 78.0–100.0)
MONO ABS: 0.4 10*3/uL (ref 0.1–1.0)
Monocytes Relative: 7.4 % (ref 3.0–12.0)
NEUTROS ABS: 2.5 10*3/uL (ref 1.4–7.7)
Neutrophils Relative %: 49.6 % (ref 43.0–77.0)
Platelets: 233 10*3/uL (ref 150.0–400.0)
RBC: 4.56 Mil/uL (ref 3.87–5.11)
RDW: 12.8 % (ref 11.5–15.5)
WBC: 5.1 10*3/uL (ref 4.0–10.5)

## 2015-06-04 LAB — LIPID PANEL
Cholesterol: 217 mg/dL — ABNORMAL HIGH (ref 0–200)
HDL: 58.1 mg/dL (ref >39.00)
NonHDL: 158.9
TRIGLYCERIDES: 238 mg/dL — AB (ref 0.0–149.0)
Total CHOL/HDL Ratio: 4
VLDL: 47.6 mg/dL — ABNORMAL HIGH (ref 0.0–40.0)

## 2015-06-04 LAB — TSH: TSH: 1.05 u[IU]/mL (ref 0.35–4.50)

## 2015-06-04 LAB — LDL CHOLESTEROL, DIRECT: Direct LDL: 112 mg/dL

## 2015-06-04 LAB — HEPATIC FUNCTION PANEL
ALBUMIN: 4.4 g/dL (ref 3.5–5.2)
ALT: 29 U/L (ref 0–35)
AST: 22 U/L (ref 0–37)
Alkaline Phosphatase: 71 U/L (ref 39–117)
BILIRUBIN TOTAL: 0.5 mg/dL (ref 0.2–1.2)
Bilirubin, Direct: 0.1 mg/dL (ref 0.0–0.3)
Total Protein: 7.2 g/dL (ref 6.0–8.3)

## 2015-06-05 ENCOUNTER — Encounter: Payer: Self-pay | Admitting: *Deleted

## 2015-06-05 LAB — CARBAMAZEPINE LEVEL, TOTAL: Carbamazepine Lvl: 6 ug/mL (ref 4.0–12.0)

## 2015-06-08 ENCOUNTER — Other Ambulatory Visit: Payer: Self-pay | Admitting: Internal Medicine

## 2015-06-09 ENCOUNTER — Ambulatory Visit (INDEPENDENT_AMBULATORY_CARE_PROVIDER_SITE_OTHER): Payer: BLUE CROSS/BLUE SHIELD | Admitting: Internal Medicine

## 2015-06-09 ENCOUNTER — Encounter: Payer: Self-pay | Admitting: Internal Medicine

## 2015-06-09 VITALS — BP 120/80 | HR 93 | Temp 98.8°F | Ht 68.0 in | Wt 216.0 lb

## 2015-06-09 DIAGNOSIS — K259 Gastric ulcer, unspecified as acute or chronic, without hemorrhage or perforation: Secondary | ICD-10-CM | POA: Diagnosis not present

## 2015-06-09 DIAGNOSIS — E7800 Pure hypercholesterolemia, unspecified: Secondary | ICD-10-CM

## 2015-06-09 DIAGNOSIS — E876 Hypokalemia: Secondary | ICD-10-CM | POA: Diagnosis not present

## 2015-06-09 DIAGNOSIS — G4733 Obstructive sleep apnea (adult) (pediatric): Secondary | ICD-10-CM | POA: Diagnosis not present

## 2015-06-09 DIAGNOSIS — Z0000 Encounter for general adult medical examination without abnormal findings: Secondary | ICD-10-CM

## 2015-06-09 DIAGNOSIS — I10 Essential (primary) hypertension: Secondary | ICD-10-CM

## 2015-06-09 DIAGNOSIS — G40909 Epilepsy, unspecified, not intractable, without status epilepticus: Secondary | ICD-10-CM

## 2015-06-09 DIAGNOSIS — E039 Hypothyroidism, unspecified: Secondary | ICD-10-CM

## 2015-06-09 DIAGNOSIS — R1084 Generalized abdominal pain: Secondary | ICD-10-CM

## 2015-06-09 MED ORDER — VALSARTAN 80 MG PO TABS: 80.0000 mg | ORAL_TABLET | Freq: Every day | ORAL | Status: DC

## 2015-06-09 NOTE — Patient Instructions (Signed)
Continue potassium for three more days and then stop.  Stop the triam/hctz.    Start diovan (valsartan) 80mg  - one per day.

## 2015-06-09 NOTE — Progress Notes (Signed)
Pre visit review using our clinic review tool, if applicable. No additional management support is needed unless otherwise documented below in the visit note. 

## 2015-06-09 NOTE — Assessment & Plan Note (Signed)
Only taking triam/hctz 1/2 tablet per day.  Will stop this and the potassium.  Start diovan 80mg  q day.  Follow pressures.  Bring in readings when come in for labs.

## 2015-06-09 NOTE — Progress Notes (Signed)
Patient ID: Jennifer Pratt, female   DOB: 29-Jun-1961, 54 y.o.   MRN: [id number]   Subjective:    Patient ID: Jennifer Pratt, female    DOB: February 09, 1961, 54 y.o.   MRN: [id number]  HPI  Patient here for a scheduled follow up.  She has adjusted her diet.  Is exercising.  Lost weight.  Feels better.  No sob.  No cardiac symptoms with increased activity or exertion.  No acid reflux.  No abdominal pain or cramping.  These symptoms have completely resolved.  Bowels doing well.  Decreased aching and pain.  Blood pressure has been doing well only on triam/hctz 1/2 tablet per day.  Off diovan now for a while.     Past Medical History  Diagnosis Date  . Hypothyroidism   . Hypertension   . Arthritis   . Seizures   . Ulcer     Gastric  . Hypercholesterolemia     Current Outpatient Prescriptions on File Prior to Visit  Medication Sig Dispense Refill  . carbamazepine (CARBATROL) 200 MG 12 hr capsule Take 1 capsule (200 mg total) by mouth 2 (two) times daily. 180 capsule 3  . diclofenac (VOLTAREN) 75 MG EC tablet     . DULoxetine (CYMBALTA) 30 MG capsule TAKE 1 CAPSULE (30 MG TOTAL) BY MOUTH DAILY. 30 capsule 3  . levothyroxine (SYNTHROID, LEVOTHROID) 175 MCG tablet TAKE 1 TABLET BY MOUTH EVERY DAY 90 tablet 3  . pravastatin (PRAVACHOL) 10 MG tablet TAKE 1 TABLET (10 MG TOTAL) BY MOUTH DAILY. 30 tablet 5  . [DISCONTINUED] KLOR-CON 10 10 MEQ tablet TAKE 1 TABLET (10 MEQ TOTAL) BY MOUTH 2 (TWO) TIMES DAILY. 60 tablet 3   No current facility-administered medications on file prior to visit.    Review of Systems  Constitutional:       Has adjusted her diet and has lost weight.  Feels better.    HENT: Negative for congestion and sinus pressure.   Respiratory: Negative for cough, chest tightness and shortness of breath.   Cardiovascular: Negative for chest pain, palpitations and leg swelling.  Gastrointestinal: Negative for nausea, vomiting, abdominal pain and diarrhea.  Genitourinary: Negative for  dysuria and difficulty urinating.  Musculoskeletal:       Pain - better.   Skin: Negative for color change and rash.  Neurological: Negative for dizziness, light-headedness and headaches.  Psychiatric/Behavioral: Negative for dysphoric mood and agitation.       Objective:    Physical Exam  Constitutional: She appears well-developed and well-nourished. No distress.  HENT:  Nose: Nose normal.  Mouth/Throat: Oropharynx is clear and moist.  Neck: Neck supple. No thyromegaly present.  Cardiovascular: Normal rate and regular rhythm.   Pulmonary/Chest: Breath sounds normal. No respiratory distress. She has no wheezes.  Abdominal: Soft. Bowel sounds are normal. There is no tenderness.  Musculoskeletal: She exhibits no edema or tenderness.  Lymphadenopathy:    She has no cervical adenopathy.  Skin: No rash noted. No erythema.  Psychiatric: She has a normal mood and affect. Her behavior is normal.    BP 120/80 mmHg  Pulse 93  Temp(Src) 98.8 F (37.1 C) (Oral)  Ht 5\' 8"  (1.727 m)  Wt 216 lb (97.977 kg)  BMI 32.85 kg/m2  SpO2 97%  LMP 10/09/2012 Wt Readings from Last 3 Encounters:  06/09/15 216 lb (97.977 kg)  12/09/14 231 lb 8 oz (105.008 kg)  10/02/14 228 lb 9.6 oz (103.692 kg)     Lab Results  Component Value Date  WBC 5.1 06/04/2015   HGB 14.0 06/04/2015   HCT 40.7 06/04/2015   PLT 233.0 06/04/2015   GLUCOSE 90 06/04/2015   CHOL 217* 06/04/2015   TRIG 238.0* 06/04/2015   HDL 58.10 06/04/2015   LDLDIRECT 112.0 06/04/2015   LDLCALC 131* 12/24/2014   ALT 29 06/04/2015   AST 22 06/04/2015   NA 142 06/04/2015   K 3.4* 06/04/2015   CL 100 06/04/2015   CREATININE 0.88 06/04/2015   BUN 17 06/04/2015   CO2 34* 06/04/2015   TSH 1.05 06/04/2015   INR 1.41 08/12/2011   HGBA1C 5.4 12/24/2014       Assessment & Plan:   Problem List Items Addressed This Visit    Abdominal pain    Resolved.        Essential hypertension, benign    Only taking triam/hctz 1/2  tablet per day.  Will stop this and the potassium.  Start diovan 80mg  q day.  Follow pressures.  Bring in readings when come in for labs.        Relevant Medications   valsartan (DIOVAN) 80 MG tablet   Gastric ulcer    No upper symptoms reported.        Health care maintenance    Physical 12/09/14.  Mammogram 09/17/14 - Birads I.   Colonoscopy overdue.        Hypercholesterolemia    Low cholesterol diet and exercise.  Has adjusted her diet.  Has lost weight.  Discussed cholesterol medication.  She desires not to start.       Relevant Medications   valsartan (DIOVAN) 80 MG tablet   Hypokalemia - Primary    Low potassium on recent check.  Gave information on foods with increased potassium.  Follow.  Recheck potassium.        Relevant Orders   Potassium   Hypothyroidism    On thyroid replacement.  Follow tsh.       Obstructive sleep apnea    Using CPAP.       Seizure disorder    Followed by Dr Jannifer Franklin and Cecille Rubin.  On carbamazepine.          I spent 25 minutes with the patient and Pratt than 50% of the time was spent in consultation regarding the above.     Einar Pheasant, MD

## 2015-06-12 ENCOUNTER — Other Ambulatory Visit: Payer: Self-pay | Admitting: Internal Medicine

## 2015-06-14 ENCOUNTER — Encounter: Payer: Self-pay | Admitting: Internal Medicine

## 2015-06-14 DIAGNOSIS — Z0000 Encounter for general adult medical examination without abnormal findings: Secondary | ICD-10-CM | POA: Insufficient documentation

## 2015-06-14 NOTE — Assessment & Plan Note (Signed)
Low cholesterol diet and exercise.  Has adjusted her diet.  Has lost weight.  Discussed cholesterol medication.  She desires not to start.

## 2015-06-14 NOTE — Assessment & Plan Note (Signed)
No upper symptoms reported.   

## 2015-06-14 NOTE — Assessment & Plan Note (Signed)
Resolved

## 2015-06-14 NOTE — Assessment & Plan Note (Signed)
Followed by Dr Jannifer Franklin and Cecille Rubin.  On carbamazepine.

## 2015-06-14 NOTE — Assessment & Plan Note (Signed)
Low potassium on recent check.  Gave information on foods with increased potassium.  Follow.  Recheck potassium.

## 2015-06-14 NOTE — Assessment & Plan Note (Signed)
Physical 12/09/14.  Mammogram 09/17/14 - Birads I.   Colonoscopy overdue.

## 2015-06-14 NOTE — Assessment & Plan Note (Signed)
Using CPAP 

## 2015-06-14 NOTE — Assessment & Plan Note (Signed)
On thyroid replacement.  Follow tsh.  

## 2015-09-05 ENCOUNTER — Other Ambulatory Visit: Payer: Self-pay | Admitting: Internal Medicine

## 2015-10-12 ENCOUNTER — Other Ambulatory Visit: Payer: Self-pay | Admitting: Internal Medicine

## 2015-10-12 ENCOUNTER — Encounter: Payer: Self-pay | Admitting: Nurse Practitioner

## 2015-10-12 ENCOUNTER — Ambulatory Visit (INDEPENDENT_AMBULATORY_CARE_PROVIDER_SITE_OTHER): Payer: BLUE CROSS/BLUE SHIELD | Admitting: Nurse Practitioner

## 2015-10-12 VITALS — BP 144/96 | HR 80 | Wt 218.8 lb

## 2015-10-12 DIAGNOSIS — Z5181 Encounter for therapeutic drug level monitoring: Secondary | ICD-10-CM | POA: Diagnosis not present

## 2015-10-12 DIAGNOSIS — G40909 Epilepsy, unspecified, not intractable, without status epilepticus: Secondary | ICD-10-CM

## 2015-10-12 MED ORDER — CARBAMAZEPINE ER 200 MG PO CP12: 200.0000 mg | ORAL_CAPSULE | Freq: Two times a day (BID) | ORAL | Status: DC

## 2015-10-12 NOTE — Patient Instructions (Signed)
Continue Carbatrol at current dose will refill Will check Carbatrol level today . Call for any seizure activity Follow-up yearly and when necessary  

## 2015-10-12 NOTE — Progress Notes (Signed)
[facility]  PATIENT: Jennifer Pratt DOB: 03-07-1961   REASON FOR VISIT: Follow-up for seizure disorder HISTORY FROM: Patient    HISTORY OF PRESENT ILLNESS:Ms. Sciarra is a 54 year old right-handed white female with a history of seizures, well controlled on Carbatrol. The patient indicates that her last seizure event was 15 years ago. The patient has not had any seizures since being on Carbatrol. Her seizures used to be associated with her menstrual cycle, but she has not had a period in over two years. The patient has recently had a lot of problems with low back pain and sciatica bilaterally. The patient was told she had spinal stenosis. She has some numbness and tingling in the feet associated with this issue.  She continues to operate a motor vehicle. No other new significant medical issues have come up since last seen.   REVIEW OF SYSTEMS: Full 14 system review of systems performed and notable only for those listed, all others are neg:  Constitutional: neg  Cardiovascular: neg Ear/Nose/Throat: neg  Skin: neg Eyes: neg Respiratory: neg Gastroitestinal: neg  Hematology/Lymphatic: neg  Endocrine: neg Musculoskeletal: Spinal stenosis Allergy/Immunology: neg Neurological: Numbness in the toes  Psychiatric: neg Sleep : neg   ALLERGIES: Allergies  Allergen Reactions  . Lisinopril     HOME MEDICATIONS: Outpatient Prescriptions Prior to Visit  Medication Sig Dispense Refill  . carbamazepine (CARBATROL) 200 MG 12 hr capsule Take 1 capsule (200 mg total) by mouth 2 (two) times daily. 180 capsule 3  . diclofenac (VOLTAREN) 75 MG EC tablet     . DULoxetine (CYMBALTA) 30 MG capsule TAKE 1 CAPSULE (30 MG TOTAL) BY MOUTH DAILY. 30 capsule 3  . levothyroxine (SYNTHROID, LEVOTHROID) 175 MCG tablet TAKE 1 TABLET BY MOUTH EVERY DAY 90 tablet 3  . pravastatin (PRAVACHOL) 10 MG tablet TAKE 1 TABLET (10 MG TOTAL) BY MOUTH DAILY. 30 tablet 5  . valsartan (DIOVAN) 80 MG  tablet TAKE 1 TABLET (80 MG TOTAL) BY MOUTH DAILY. 30 tablet 2  . DULoxetine (CYMBALTA) 30 MG capsule TAKE 1 CAPSULE (30 MG TOTAL) BY MOUTH DAILY. 30 capsule 2   No facility-administered medications prior to visit.    PAST MEDICAL HISTORY: Past Medical History  Diagnosis Date  . Hypothyroidism   . Hypertension   . Arthritis   . Seizures ([place])   . Ulcer     Gastric  . Hypercholesterolemia     PAST SURGICAL HISTORY: Past Surgical History  Procedure Laterality Date  . Thyroidectomy  1998  . Thumb surgery  2005  . Cesarean section    . Total knee arthroplasty Left 2012    FAMILY HISTORY: Family History  Problem Relation Age of Onset  . Diabetes Father   . Breast cancer Neg Hx   . Colon cancer Neg Hx     SOCIAL HISTORY: Social History   Social History  . Marital Status: Married    Spouse Name: N/A  . Number of Children: 1  . Years of Education: N/A   Occupational History  .     Social History Main Topics  . Smoking status: Former Smoker    Quit date: 01/09/1991  . Smokeless tobacco: Never Used  . Alcohol Use: 0.0 oz/week    0 Standard drinks or equivalent per week     Comment: Socially  . Drug Use: No  . Sexual Activity: Not on file   Other Topics Concern  . Not on file   Social History Narrative  PHYSICAL EXAM  Filed Vitals:   10/12/15 1526  BP: 144/96  Pulse: 80  Weight: 218 lb 12.8 oz (99.247 kg)   Body mass index is 33.28 kg/(m^2). General: The patient is alert and cooperative at the time of the examination. The patient is moderately obese. Skin: No significant peripheral edema is noted. Neurologic Exam Mental status: The patient is oriented x 3. Cranial nerves: Facial symmetry is present. Speech is normal, no aphasia or dysarthria is noted. Extraocular movements are full. Visual fields are full. Motor: The patient has good strength in all 4 extremities. Sensory examination: Soft touch sensation is symmetric on the face, arms, and  legs. Coordination: The patient has good finger-nose-finger and heel-to-shin bilaterally. Gait and station: The patient has a normal gait. Tandem gait is normal. Romberg is negative. No drift is seen. Reflexes: Deep tendon reflexes are symmetric upper and lower.    DIAGNOSTIC DATA (LABS, IMAGING, TESTING) - I reviewed patient records, labs, notes, testing and imaging myself where available.  Lab Results  Component Value Date   WBC 5.1 06/04/2015   HGB 14.0 06/04/2015   HCT 40.7 06/04/2015   MCV 89.2 06/04/2015   PLT 233.0 06/04/2015      Component Value Date/Time   NA 142 06/04/2015 0943   K 3.4* 06/04/2015 0943   CL 100 06/04/2015 0943   CO2 34* 06/04/2015 0943   GLUCOSE 90 06/04/2015 0943   BUN 17 06/04/2015 0943   CREATININE 0.88 06/04/2015 0943   CALCIUM 9.9 06/04/2015 0943   PROT 7.2 06/04/2015 0943   ALBUMIN 4.4 06/04/2015 0943   AST 22 06/04/2015 0943   ALT 29 06/04/2015 0943   ALKPHOS 71 06/04/2015 0943   BILITOT 0.5 06/04/2015 0943   GFRNONAA >60 08/12/2011 0512   GFRAA >60 08/12/2011 0512   Lab Results  Component Value Date   CHOL 217* 06/04/2015   HDL 58.10 06/04/2015   LDLCALC 131* 12/24/2014   LDLDIRECT 112.0 06/04/2015   TRIG 238.0* 06/04/2015   CHOLHDL 4 06/04/2015   Lab Results  Component Value Date   HGBA1C 5.4 12/24/2014    Lab Results  Component Value Date   TSH 1.05 06/04/2015      ASSESSMENT AND PLAN  54 y.o. year old female  has a past medical history of seizure disorder here to follow-up. She has not had any seizure events for 15 years. She is well controlled on Carbatrol.  Continue Carbatrol at current dose will refill Will check Carbatrol level today reviewed CBC and CMP from 06/04/15. Call for any seizure activity Follow-up yearly and when necessary Dennie Bible, [facility], [facility], APRN  [facility] [address], [place], [place] [address] [phone]

## 2015-10-12 NOTE — Progress Notes (Signed)
I have read the note, and I agree with the clinical assessment and plan.  Nareg Breighner KEITH   

## 2015-10-13 LAB — CARBAMAZEPINE LEVEL, TOTAL: Carbamazepine Lvl: 5.8 ug/mL (ref 4.0–12.0)

## 2015-10-14 ENCOUNTER — Telehealth: Payer: Self-pay | Admitting: *Deleted

## 2015-10-14 NOTE — Telephone Encounter (Signed)
LMVM for pt (cell - DPR), that labs ok, carbamazepine on lower end of normal and since no seizures would not adjust dose).   Pt is to call back if questions.

## 2015-10-14 NOTE — Telephone Encounter (Signed)
-----   Message from Dennie Bible, NP sent at 10/13/2015  2:50 PM EST ----- Labs ok , CBZ a little low but no seizures in many years. Will not adjust dose.

## 2015-11-05 ENCOUNTER — Other Ambulatory Visit: Payer: Self-pay | Admitting: Internal Medicine

## 2015-11-09 ENCOUNTER — Other Ambulatory Visit: Payer: Self-pay

## 2015-11-09 MED ORDER — DULOXETINE HCL 30 MG PO CPEP: ORAL_CAPSULE | ORAL | Status: DC

## 2015-12-14 ENCOUNTER — Other Ambulatory Visit: Payer: Self-pay | Admitting: Neurology

## 2015-12-14 ENCOUNTER — Other Ambulatory Visit: Payer: Self-pay | Admitting: Internal Medicine

## 2016-04-06 ENCOUNTER — Other Ambulatory Visit: Payer: Self-pay | Admitting: Internal Medicine

## 2016-04-07 NOTE — Telephone Encounter (Signed)
Seen last year in July, has up coming appt in August, please advise refill?

## 2016-04-07 NOTE — Telephone Encounter (Signed)
Please call pt and notify her that she needs labs scheduled and will need to keep appt in 06/2016.  Once lab appt scheduled, ok to refill x 1 (to make sure keeps lab appt).  Thanks

## 2016-04-21 ENCOUNTER — Other Ambulatory Visit (INDEPENDENT_AMBULATORY_CARE_PROVIDER_SITE_OTHER): Payer: BLUE CROSS/BLUE SHIELD

## 2016-04-21 DIAGNOSIS — E876 Hypokalemia: Secondary | ICD-10-CM

## 2016-04-21 LAB — POTASSIUM: Potassium: 3.7 mEq/L (ref 3.5–5.1)

## 2016-05-04 ENCOUNTER — Other Ambulatory Visit (INDEPENDENT_AMBULATORY_CARE_PROVIDER_SITE_OTHER): Payer: BLUE CROSS/BLUE SHIELD

## 2016-05-04 ENCOUNTER — Telehealth: Payer: Self-pay | Admitting: *Deleted

## 2016-05-04 DIAGNOSIS — E039 Hypothyroidism, unspecified: Secondary | ICD-10-CM

## 2016-05-04 DIAGNOSIS — E7800 Pure hypercholesterolemia, unspecified: Secondary | ICD-10-CM | POA: Diagnosis not present

## 2016-05-04 DIAGNOSIS — R739 Hyperglycemia, unspecified: Secondary | ICD-10-CM

## 2016-05-04 DIAGNOSIS — I10 Essential (primary) hypertension: Secondary | ICD-10-CM

## 2016-05-04 DIAGNOSIS — G40909 Epilepsy, unspecified, not intractable, without status epilepticus: Secondary | ICD-10-CM

## 2016-05-04 LAB — CBC WITH DIFFERENTIAL/PLATELET
BASOS ABS: 0 10*3/uL (ref 0.0–0.1)
Basophils Relative: 0.2 % (ref 0.0–3.0)
EOS PCT: 1.5 % (ref 0.0–5.0)
Eosinophils Absolute: 0.1 10*3/uL (ref 0.0–0.7)
HEMATOCRIT: 39.8 % (ref 36.0–46.0)
Hemoglobin: 13.5 g/dL (ref 12.0–15.0)
LYMPHS ABS: 1.9 10*3/uL (ref 0.7–4.0)
LYMPHS PCT: 37.5 % (ref 12.0–46.0)
MCHC: 33.8 g/dL (ref 30.0–36.0)
MCV: 89 fl (ref 78.0–100.0)
MONOS PCT: 5.5 % (ref 3.0–12.0)
Monocytes Absolute: 0.3 10*3/uL (ref 0.1–1.0)
NEUTROS ABS: 2.7 10*3/uL (ref 1.4–7.7)
NEUTROS PCT: 55.3 % (ref 43.0–77.0)
PLATELETS: 238 10*3/uL (ref 150.0–400.0)
RBC: 4.48 Mil/uL (ref 3.87–5.11)
RDW: 13.1 % (ref 11.5–15.5)
WBC: 5 10*3/uL (ref 4.0–10.5)

## 2016-05-04 LAB — BASIC METABOLIC PANEL
BUN: 17 mg/dL (ref 6–23)
CHLORIDE: 104 meq/L (ref 96–112)
CO2: 27 meq/L (ref 19–32)
Calcium: 9.4 mg/dL (ref 8.4–10.5)
Creatinine, Ser: 0.73 mg/dL (ref 0.40–1.20)
GFR: 88.22 mL/min (ref >60.00)
Glucose, Bld: 97 mg/dL (ref 70–99)
POTASSIUM: 3.9 meq/L (ref 3.5–5.1)
Sodium: 141 mEq/L (ref 135–145)

## 2016-05-04 LAB — HEMOGLOBIN A1C: HEMOGLOBIN A1C: 5.1 % (ref 4.6–6.5)

## 2016-05-04 LAB — TSH: TSH: 0.81 u[IU]/mL (ref 0.35–4.50)

## 2016-05-04 LAB — LIPID PANEL
CHOL/HDL RATIO: 4
Cholesterol: 230 mg/dL — ABNORMAL HIGH (ref 0–200)
HDL: 55.2 mg/dL (ref >39.00)
LDL Cholesterol: 140 mg/dL — ABNORMAL HIGH (ref 0–99)
NONHDL: 175.17
Triglycerides: 174 mg/dL — ABNORMAL HIGH (ref 0.0–149.0)
VLDL: 34.8 mg/dL (ref 0.0–40.0)

## 2016-05-04 LAB — HEPATIC FUNCTION PANEL
ALBUMIN: 4.5 g/dL (ref 3.5–5.2)
ALT: 28 U/L (ref 0–35)
AST: 21 U/L (ref 0–37)
Alkaline Phosphatase: 83 U/L (ref 39–117)
BILIRUBIN TOTAL: 0.5 mg/dL (ref 0.2–1.2)
Bilirubin, Direct: 0.1 mg/dL (ref 0.0–0.3)
Total Protein: 7 g/dL (ref 6.0–8.3)

## 2016-05-04 NOTE — Telephone Encounter (Signed)
Per note on 04/23/26, pt was asked to come back for "full panel of labs" no orders found. Please place order (pt here now)

## 2016-05-04 NOTE — Telephone Encounter (Signed)
Order placed for labs.

## 2016-05-09 ENCOUNTER — Encounter: Payer: BLUE CROSS/BLUE SHIELD | Admitting: Internal Medicine

## 2016-05-11 ENCOUNTER — Other Ambulatory Visit: Payer: Self-pay | Admitting: Internal Medicine

## 2016-05-11 DIAGNOSIS — Z1231 Encounter for screening mammogram for malignant neoplasm of breast: Secondary | ICD-10-CM

## 2016-05-25 ENCOUNTER — Ambulatory Visit
Admission: RE | Admit: 2016-05-25 | Discharge: 2016-05-25 | Disposition: A | Discharge status: Home / Self Care | Payer: BLUE CROSS/BLUE SHIELD | Source: Ambulatory Visit | Attending: Internal Medicine | Admitting: Internal Medicine

## 2016-05-25 DIAGNOSIS — Z1231 Encounter for screening mammogram for malignant neoplasm of breast: Secondary | ICD-10-CM | POA: Diagnosis not present

## 2016-05-30 ENCOUNTER — Other Ambulatory Visit: Payer: Self-pay

## 2016-05-30 MED ORDER — PRAVASTATIN SODIUM 10 MG PO TABS: ORAL_TABLET | ORAL | Status: DC

## 2016-05-30 NOTE — Telephone Encounter (Signed)
Medication refill

## 2016-07-01 ENCOUNTER — Encounter: Payer: Self-pay | Admitting: Internal Medicine

## 2016-07-01 ENCOUNTER — Other Ambulatory Visit (HOSPITAL_COMMUNITY)
Admission: RE | Admit: 2016-07-01 | Discharge: 2016-07-01 | Disposition: A | Discharge status: Home / Self Care | Payer: BLUE CROSS/BLUE SHIELD | Source: Ambulatory Visit | Attending: Internal Medicine | Admitting: Internal Medicine

## 2016-07-01 ENCOUNTER — Ambulatory Visit (INDEPENDENT_AMBULATORY_CARE_PROVIDER_SITE_OTHER): Payer: BLUE CROSS/BLUE SHIELD | Admitting: Internal Medicine

## 2016-07-01 VITALS — BP 128/86 | HR 90 | Temp 99.6°F | Resp 18 | Ht 67.75 in | Wt 221.0 lb

## 2016-07-01 DIAGNOSIS — Z124 Encounter for screening for malignant neoplasm of cervix: Secondary | ICD-10-CM

## 2016-07-01 DIAGNOSIS — Z1151 Encounter for screening for human papillomavirus (HPV): Secondary | ICD-10-CM | POA: Diagnosis not present

## 2016-07-01 DIAGNOSIS — Z01419 Encounter for gynecological examination (general) (routine) without abnormal findings: Secondary | ICD-10-CM | POA: Diagnosis not present

## 2016-07-01 DIAGNOSIS — E039 Hypothyroidism, unspecified: Secondary | ICD-10-CM

## 2016-07-01 DIAGNOSIS — I10 Essential (primary) hypertension: Secondary | ICD-10-CM

## 2016-07-01 DIAGNOSIS — G40909 Epilepsy, unspecified, not intractable, without status epilepticus: Secondary | ICD-10-CM

## 2016-07-01 DIAGNOSIS — Z1211 Encounter for screening for malignant neoplasm of colon: Secondary | ICD-10-CM

## 2016-07-01 DIAGNOSIS — G4733 Obstructive sleep apnea (adult) (pediatric): Secondary | ICD-10-CM

## 2016-07-01 DIAGNOSIS — Z0000 Encounter for general adult medical examination without abnormal findings: Secondary | ICD-10-CM

## 2016-07-01 DIAGNOSIS — R221 Localized swelling, mass and lump, neck: Secondary | ICD-10-CM

## 2016-07-01 DIAGNOSIS — E7800 Pure hypercholesterolemia, unspecified: Secondary | ICD-10-CM

## 2016-07-01 NOTE — Progress Notes (Signed)
Pre visit review using our clinic review tool, if applicable. No additional management support is needed unless otherwise documented below in the visit note. 

## 2016-07-01 NOTE — Progress Notes (Signed)
Patient ID: Jennifer Pratt, female   DOB: 05-08-1961, 55 y.o.   MRN: [id number]   Subjective:    Patient ID: Jennifer Pratt, female    DOB: 02/17/1961, 55 y.o.   MRN: [id number]  HPI  Patient here for her physical exam.  She is doing relatively well.  Increased stress with her mother's health issues.  Overall she feels she is handling things relatively well.  Discussed exercise.  No chest pain.  No sob.  No acid reflux.  No abdominal pain or cramping.  Bowels stable.  No urine or bowel change.  Discussed the need for colonoscopy.    Past Medical History:  Diagnosis Date  . Arthritis   . Hypercholesterolemia   . Hypertension   . Hypothyroidism   . Seizures ([place])   . Ulcer    Gastric   Past Surgical History:  Procedure Laterality Date  . CESAREAN SECTION    . thumb surgery  2005  . THYROIDECTOMY  1998  . TOTAL KNEE ARTHROPLASTY Left 2012   Family History  Problem Relation Age of Onset  . Diabetes Father   . Dementia Mother   . Breast cancer Neg Hx   . Colon cancer Neg Hx    Social History   Social History  . Marital status: Married    Spouse name: N/A  . Number of children: 1  . Years of education: N/A   Occupational History  .  [employer]   Social History Main Topics  . Smoking status: Former Smoker    Packs/day: 0.25    Types: Cigarettes    Quit date: 01/09/1991  . Smokeless tobacco: Never Used  . Alcohol use 0.0 oz/week     Comment: Socially  . Drug use: No  . Sexual activity: Yes   Other Topics Concern  . None   Social History Narrative  . None    Outpatient Encounter Prescriptions as of 07/01/2016  Medication Sig  . carbamazepine (CARBATROL) 200 MG 12 hr capsule TAKE 1 CAPSULE (200 MG TOTAL) BY MOUTH 2 (TWO) TIMES DAILY.  Marland Kitchen diclofenac (VOLTAREN) 75 MG EC tablet   . DULoxetine (CYMBALTA) 30 MG capsule TAKE 1 CAPSULE (30 MG TOTAL) BY MOUTH DAILY.  Marland Kitchen levothyroxine (SYNTHROID, LEVOTHROID) 175 MCG tablet TAKE 1 TABLET BY MOUTH EVERY DAY  .  pravastatin (PRAVACHOL) 10 MG tablet TAKE 1 TABLET (10 MG TOTAL) BY MOUTH DAILY.  . valsartan (DIOVAN) 80 MG tablet TAKE 1 TABLET (80 MG TOTAL) BY MOUTH DAILY.  . [DISCONTINUED] carbamazepine (CARBATROL) 200 MG 12 hr capsule Take 1 capsule (200 mg total) by mouth 2 (two) times daily.  . [DISCONTINUED] levothyroxine (SYNTHROID, LEVOTHROID) 175 MCG tablet TAKE 1 TABLET BY MOUTH EVERY DAY   No facility-administered encounter medications on file as of 07/01/2016.     Review of Systems  Constitutional: Negative for appetite change and unexpected weight change.  HENT: Negative for congestion and sinus pressure.   Eyes: Negative for pain and visual disturbance.  Respiratory: Negative for cough, chest tightness and shortness of breath.   Cardiovascular: Negative for chest pain, palpitations and leg swelling.  Gastrointestinal: Negative for abdominal pain, diarrhea, nausea and vomiting.  Genitourinary: Negative for difficulty urinating and dysuria.  Musculoskeletal: Negative for back pain and joint swelling.  Skin: Negative for color change and rash.  Neurological: Negative for dizziness, light-headedness and headaches.  Hematological: Negative for adenopathy. Does not bruise/bleed easily.  Psychiatric/Behavioral: Negative for agitation and dysphoric mood.  Objective:     Blood pressure rechecked by me:  136/80  Physical Exam  Constitutional: She is oriented to person, place, and time. She appears well-developed and well-nourished. No distress.  HENT:  Nose: Nose normal.  Mouth/Throat: Oropharynx is clear and moist.  Eyes: Right eye exhibits no discharge. Left eye exhibits no discharge. No scleral icterus.  Neck: Neck supple. No thyromegaly present.  Cardiovascular: Normal rate and regular rhythm.   Pulmonary/Chest: Breath sounds normal. No accessory muscle usage. No tachypnea. No respiratory distress. She has no decreased breath sounds. She has no wheezes. She has no rhonchi. Right  breast exhibits no inverted nipple, no mass, no nipple discharge and no tenderness (no axillary adenopathy). Left breast exhibits no inverted nipple, no mass, no nipple discharge and no tenderness (no axilarry adenopathy).  Abdominal: Soft. Bowel sounds are normal. There is no tenderness.  Genitourinary:  Genitourinary Comments: Normal external genitalia.  Vaginal vault without lesions.  Cervix identified.  Pap smear performed.  Could not appreciate any adnexal masses or tenderness.    Musculoskeletal: She exhibits no edema or tenderness.  Lymphadenopathy:    She has no cervical adenopathy.  Neurological: She is alert and oriented to person, place, and time.  Skin: Skin is warm. No rash noted. No erythema.  Psychiatric: She has a normal mood and affect. Her behavior is normal.    BP 128/86   Pulse 90   Temp 99.6 F (37.6 C)   Resp 18   Ht 5' 7.75" (1.721 m)   Wt 221 lb (100.2 kg)   LMP 10/09/2012   BMI 33.85 kg/m  Wt Readings from Last 3 Encounters:  07/01/16 221 lb (100.2 kg)  10/12/15 218 lb 12.8 oz (99.2 kg)  06/09/15 216 lb (98 kg)     Lab Results  Component Value Date   WBC 5.0 05/04/2016   HGB 13.5 05/04/2016   HCT 39.8 05/04/2016   PLT 238.0 05/04/2016   GLUCOSE 97 05/04/2016   CHOL 230 (H) 05/04/2016   TRIG 174.0 (H) 05/04/2016   HDL 55.20 05/04/2016   LDLDIRECT 112.0 06/04/2015   LDLCALC 140 (H) 05/04/2016   ALT 28 05/04/2016   AST 21 05/04/2016   NA 141 05/04/2016   K 3.9 05/04/2016   CL 104 05/04/2016   CREATININE 0.73 05/04/2016   BUN 17 05/04/2016   CO2 27 05/04/2016   TSH 0.81 05/04/2016   INR 1.41 08/12/2011   HGBA1C 5.1 05/04/2016    Mm Digital Screening Bilateral  Result Date: 05/26/2016 CLINICAL DATA:  Screening. EXAM: DIGITAL SCREENING BILATERAL MAMMOGRAM WITH CAD COMPARISON:  Previous exam(s). ACR Breast Density Category b: There are scattered areas of fibroglandular density. FINDINGS: There are no findings suspicious for malignancy. Images  were processed with CAD. IMPRESSION: No mammographic evidence of malignancy. A result letter of this screening mammogram will be mailed directly to the patient. RECOMMENDATION: Screening mammogram in one year. (Code:SM-B-01Y) BI-RADS CATEGORY  1: Negative. Electronically Signed   By: Ammie Ferrier M.D.   On: 05/26/2016 08:17       Assessment & Plan:   Problem List Items Addressed This Visit    Essential hypertension, benign    Blood pressure as outlined.  Continue same medication regimen.  Follow pressures.  Follow metabolic panel.        Relevant Orders   Hemoglobin [id number]   Basic metabolic panel   Health care maintenance    Physical today 07/01/16.  PAP 07/01/16.  Colonoscopy overdue.  Discussed with her  today.  Refer to GI.        Hypercholesterolemia    On pravastatin.  Low cholesterol diet and exercise.  Follow lipid panel and liver function tests.  Discussed cholesterol results.  Hold on increasing the pravastatin.        Relevant Orders   Lipid panel   Hepatic function panel   Hypothyroidism    On thyroid replacement.  Follow tsh.       Neck nodule    Neck nodule.  Refer to ENT.        Relevant Orders   Ambulatory referral to ENT   Obstructive sleep apnea    CPAP.       Seizure disorder ([place])    Followed by neurology.  On carbamazepine.         Other Visit Diagnoses    Routine general medical examination at a health care facility    -  Primary   Colon cancer screening       Relevant Orders   Ambulatory referral to Gastroenterology   Pap smear for cervical cancer screening       Relevant Orders   Cytology - PAP       Einar Pheasant, MD

## 2016-07-02 ENCOUNTER — Encounter: Payer: Self-pay | Admitting: Internal Medicine

## 2016-07-02 NOTE — Assessment & Plan Note (Signed)
Followed by neurology.  On carbamazepine.

## 2016-07-02 NOTE — Assessment & Plan Note (Signed)
On pravastatin.  Low cholesterol diet and exercise.  Follow lipid panel and liver function tests.  Discussed cholesterol results.  Hold on increasing the pravastatin.

## 2016-07-02 NOTE — Assessment & Plan Note (Signed)
On thyroid replacement.  Follow tsh.  

## 2016-07-02 NOTE — Assessment & Plan Note (Addendum)
Neck nodule.  Refer to ENT.    Addendum.  Scheduled pt for ENT.  She called back.  No nodule.  Has seen Dr Kathyrn Sheriff previously.  See note.  Hold on referral.  (07/06/16)

## 2016-07-02 NOTE — Assessment & Plan Note (Signed)
Physical today 07/01/16.  PAP 07/01/16.  Colonoscopy overdue.  Discussed with her today.  Refer to GI.

## 2016-07-02 NOTE — Assessment & Plan Note (Signed)
Blood pressure as outlined.  Continue same medication regimen.  Follow pressures.  Follow metabolic panel.  

## 2016-07-02 NOTE — Assessment & Plan Note (Signed)
CPAP.  

## 2016-07-06 ENCOUNTER — Telehealth: Payer: Self-pay | Admitting: *Deleted

## 2016-07-06 LAB — CYTOLOGY - PAP

## 2016-07-06 NOTE — Telephone Encounter (Signed)
Last OV was 8/4, I see the referral was placed on 8/5, Please advise, thanks

## 2016-07-06 NOTE — Telephone Encounter (Signed)
Called pt and spoke with her.  She does not need referral. See addendum on 07/01/16 note.

## 2016-07-06 NOTE — Telephone Encounter (Signed)
Patient stated that she received a call for a referral to see Dr Dionicio Stall for a neck nodule. She stated that she hadn't had this issue since 2015 and did not discuss this with Dr. Nicki Reaper at her last visit.Marland Kitchen She wanted to confirm that this appt was needed.  Pt contact [phone]

## 2016-07-07 ENCOUNTER — Encounter: Payer: Self-pay | Admitting: Internal Medicine

## 2016-07-19 DIAGNOSIS — Z1211 Encounter for screening for malignant neoplasm of colon: Secondary | ICD-10-CM | POA: Diagnosis not present

## 2016-09-09 ENCOUNTER — Other Ambulatory Visit: Payer: Self-pay | Admitting: Internal Medicine

## 2016-09-13 ENCOUNTER — Other Ambulatory Visit (INDEPENDENT_AMBULATORY_CARE_PROVIDER_SITE_OTHER): Payer: BLUE CROSS/BLUE SHIELD

## 2016-09-13 DIAGNOSIS — I10 Essential (primary) hypertension: Secondary | ICD-10-CM | POA: Diagnosis not present

## 2016-09-13 DIAGNOSIS — E7800 Pure hypercholesterolemia, unspecified: Secondary | ICD-10-CM | POA: Diagnosis not present

## 2016-09-13 LAB — HEPATIC FUNCTION PANEL
ALBUMIN: 4.9 g/dL (ref 3.5–5.2)
ALT: 24 U/L (ref 0–35)
AST: 20 U/L (ref 0–37)
Alkaline Phosphatase: 76 U/L (ref 39–117)
BILIRUBIN DIRECT: 0.1 mg/dL (ref 0.0–0.3)
TOTAL PROTEIN: 7.8 g/dL (ref 6.0–8.3)
Total Bilirubin: 0.4 mg/dL (ref 0.2–1.2)

## 2016-09-13 LAB — BASIC METABOLIC PANEL
BUN: 17 mg/dL (ref 6–23)
CO2: 33 mEq/L — ABNORMAL HIGH (ref 19–32)
Calcium: 9.7 mg/dL (ref 8.4–10.5)
Chloride: 103 mEq/L (ref 96–112)
Creatinine, Ser: 0.8 mg/dL (ref 0.40–1.20)
GFR: 79.26 mL/min (ref >60.00)
Glucose, Bld: 94 mg/dL (ref 70–99)
Potassium: 3.5 mEq/L (ref 3.5–5.1)
SODIUM: 144 meq/L (ref 135–145)

## 2016-09-13 LAB — LIPID PANEL
CHOL/HDL RATIO: 4
Cholesterol: 201 mg/dL — ABNORMAL HIGH (ref 0–200)
HDL: 56.9 mg/dL (ref >39.00)
LDL CALC: 113 mg/dL — AB (ref 0–99)
NONHDL: 143.91
TRIGLYCERIDES: 155 mg/dL — AB (ref 0.0–149.0)
VLDL: 31 mg/dL (ref 0.0–40.0)

## 2016-09-13 LAB — HEMOGLOBIN A1C: Hgb A1c MFr Bld: 5.1 % (ref 4.6–6.5)

## 2016-09-14 ENCOUNTER — Encounter: Payer: Self-pay | Admitting: Internal Medicine

## 2016-09-15 ENCOUNTER — Other Ambulatory Visit: Payer: Self-pay | Admitting: Internal Medicine

## 2016-09-20 ENCOUNTER — Other Ambulatory Visit: Payer: Self-pay

## 2016-09-20 MED ORDER — PRAVASTATIN SODIUM 10 MG PO TABS: ORAL_TABLET | ORAL | 1 refills | Status: DC

## 2016-09-20 MED ORDER — PRAVASTATIN SODIUM 10 MG PO TABS: ORAL_TABLET | ORAL | 5 refills | Status: DC

## 2016-10-05 ENCOUNTER — Ambulatory Visit: Payer: BLUE CROSS/BLUE SHIELD | Admitting: Certified Registered Nurse Anesthetist

## 2016-10-05 ENCOUNTER — Ambulatory Visit
Admission: RE | Admit: 2016-10-05 | Discharge: 2016-10-05 | Disposition: A | Discharge status: Home / Self Care | Payer: BLUE CROSS/BLUE SHIELD | Source: Ambulatory Visit | Attending: Unknown Physician Specialty | Admitting: Unknown Physician Specialty

## 2016-10-05 ENCOUNTER — Encounter: Payer: Self-pay | Admitting: *Deleted

## 2016-10-05 ENCOUNTER — Encounter
Admission: RE | Disposition: A | Discharge status: Home / Self Care | Payer: Self-pay | Source: Ambulatory Visit | Attending: Unknown Physician Specialty

## 2016-10-05 DIAGNOSIS — Z87891 Personal history of nicotine dependence: Secondary | ICD-10-CM | POA: Insufficient documentation

## 2016-10-05 DIAGNOSIS — R569 Unspecified convulsions: Secondary | ICD-10-CM | POA: Diagnosis not present

## 2016-10-05 DIAGNOSIS — D123 Benign neoplasm of transverse colon: Secondary | ICD-10-CM | POA: Diagnosis not present

## 2016-10-05 DIAGNOSIS — E7800 Pure hypercholesterolemia, unspecified: Secondary | ICD-10-CM | POA: Diagnosis not present

## 2016-10-05 DIAGNOSIS — Z6831 Body mass index (BMI) 31.0-31.9, adult: Secondary | ICD-10-CM | POA: Insufficient documentation

## 2016-10-05 DIAGNOSIS — E669 Obesity, unspecified: Secondary | ICD-10-CM | POA: Diagnosis not present

## 2016-10-05 DIAGNOSIS — Z1211 Encounter for screening for malignant neoplasm of colon: Secondary | ICD-10-CM | POA: Diagnosis not present

## 2016-10-05 DIAGNOSIS — E039 Hypothyroidism, unspecified: Secondary | ICD-10-CM | POA: Insufficient documentation

## 2016-10-05 DIAGNOSIS — K5790 Diverticulosis of intestine, part unspecified, without perforation or abscess without bleeding: Secondary | ICD-10-CM | POA: Diagnosis not present

## 2016-10-05 DIAGNOSIS — I10 Essential (primary) hypertension: Secondary | ICD-10-CM | POA: Diagnosis not present

## 2016-10-05 DIAGNOSIS — K5730 Diverticulosis of large intestine without perforation or abscess without bleeding: Secondary | ICD-10-CM | POA: Diagnosis not present

## 2016-10-05 DIAGNOSIS — K648 Other hemorrhoids: Secondary | ICD-10-CM | POA: Diagnosis not present

## 2016-10-05 DIAGNOSIS — K640 First degree hemorrhoids: Secondary | ICD-10-CM | POA: Insufficient documentation

## 2016-10-05 DIAGNOSIS — K635 Polyp of colon: Secondary | ICD-10-CM | POA: Insufficient documentation

## 2016-10-05 DIAGNOSIS — Z96652 Presence of left artificial knee joint: Secondary | ICD-10-CM | POA: Insufficient documentation

## 2016-10-05 DIAGNOSIS — G4730 Sleep apnea, unspecified: Secondary | ICD-10-CM | POA: Insufficient documentation

## 2016-10-05 HISTORY — PX: COLONOSCOPY: SHX5424

## 2016-10-05 LAB — HM COLONOSCOPY

## 2016-10-05 SURGERY — COLONOSCOPY
Anesthesia: General

## 2016-10-05 MED ORDER — SODIUM CHLORIDE 0.9 % IV SOLN: INTRAVENOUS | Status: DC

## 2016-10-05 MED ORDER — FENTANYL CITRATE (PF) 100 MCG/2ML IJ SOLN: INTRAMUSCULAR | Status: DC | PRN

## 2016-10-05 MED ORDER — LIDOCAINE HCL (CARDIAC) 20 MG/ML IV SOLN
INTRAVENOUS | Status: DC | PRN
  Administered 2016-10-05: 30 mg via INTRAVENOUS

## 2016-10-05 MED ORDER — PROPOFOL 500 MG/50ML IV EMUL
INTRAVENOUS | Status: DC | PRN
  Administered 2016-10-05: 150 ug/kg/min via INTRAVENOUS

## 2016-10-05 MED ORDER — PROPOFOL 10 MG/ML IV BOLUS
INTRAVENOUS | Status: DC | PRN
  Administered 2016-10-05: 50 mg via INTRAVENOUS
  Administered 2016-10-05: 40 mg via INTRAVENOUS
  Administered 2016-10-05: 50 mg via INTRAVENOUS
  Administered 2016-10-05 (×2): 20 mg via INTRAVENOUS

## 2016-10-05 MED ORDER — SODIUM CHLORIDE 0.9 % IV SOLN
INTRAVENOUS | Status: DC
  Administered 2016-10-05: 1000 mL via INTRAVENOUS

## 2016-10-05 MED ORDER — MIDAZOLAM HCL 2 MG/2ML IJ SOLN
INTRAMUSCULAR | Status: DC | PRN
  Administered 2016-10-05 (×2): 1 mg via INTRAVENOUS

## 2016-10-05 MED ORDER — PIPERACILLIN-TAZOBACTAM 3.375 G IVPB 30 MIN
3.3750 g | Freq: Once | INTRAVENOUS | Status: AC
  Administered 2016-10-05: 3.375 g via INTRAVENOUS
  Filled 2016-10-05: qty 50

## 2016-10-05 NOTE — Anesthesia Procedure Notes (Signed)
Date/Time: 10/05/2016 2:33 PM Performed by: Johnna Acosta Pre-anesthesia Checklist: Patient identified, Emergency Drugs available, Suction available, Patient being monitored and Timeout performed Patient Re-evaluated:Patient Re-evaluated prior to inductionOxygen Delivery Method: Nasal cannula

## 2016-10-05 NOTE — Op Note (Signed)
[facility]astroenter[facility] Patient Name: Jennifer Pratt Procedure Date: 10/05/2016 2:32 PM MRN: [id number] Account #: [id number] Date of Birth: 07/06/1961 Admit Type: Outpatient Age: 55 Room: [facility] ENDO ROOM 4 Gender: Female Note Status: Finalized Procedure:            Colonoscopy Indications:          Screening for colorectal malignant neoplasm Providers:            Manya Silvas, MD Referring MD:         Einar Pheasant, MD (Referring MD) Medicines:            Propofol per Anesthesia Complications:        No immediate complications. Procedure:            Pre-Anesthesia Assessment:                       - After reviewing the risks and benefits, the patient                        was deemed in satisfactory condition to undergo the                        procedure.                       After obtaining informed consent, the colonoscope was                        passed under direct vision. Throughout the procedure,                        the patient's blood pressure, pulse, and oxygen                        saturations were monitored continuously. The                        Colonoscope was introduced through the anus and                        advanced to the the cecum, identified by appendiceal                        orifice and ileocecal valve. The colonoscopy was                        somewhat difficult due to a tortuous colon. Successful                        completion of the procedure was aided by applying                        abdominal pressure. The patient tolerated the procedure                        well. The quality of the bowel preparation was good. Findings:      A diminutive polyp was found in the hepatic flexure. The polyp was       sessile. The polyp was removed with a cold snare. Resection and       retrieval were complete.  A localized area of granular mucosa was found in the sigmoid colon.       Biopsies were taken with a cold  forceps for histology.      Internal hemorrhoids were found during endoscopy. The hemorrhoids were       small and Grade I (internal hemorrhoids that do not prolapse).      The exam was otherwise without abnormality. Impression:           - One diminutive polyp at the hepatic flexure, removed                        with a cold snare. Resected and retrieved.                       - Granularity in the sigmoid colon. Biopsied.                       - Internal hemorrhoids.                       - The examination was otherwise normal. Recommendation:       - Await pathology results. Manya Silvas, MD 10/05/2016 3:22:30 PM This report has been signed electronically. Number of Addenda: 0 Note Initiated On: 10/05/2016 2:32 PM Scope Withdrawal Time: 0 hours 16 minutes 26 seconds  Total Procedure Duration: 0 hours 35 minutes 54 seconds       [facility]

## 2016-10-05 NOTE — Anesthesia Preprocedure Evaluation (Signed)
Anesthesia Evaluation  Patient identified by MRN, date of birth, ID band Patient awake    Reviewed: Allergy & Precautions, NPO status , Patient's Chart, lab work & pertinent test results  Airway Mallampati: II       Dental  (+) Teeth Intact   Pulmonary sleep apnea , former smoker,    breath sounds clear to auscultation       Cardiovascular Exercise Tolerance: Good hypertension, Pt. on medications  Rhythm:Regular Rate:Normal     Neuro/Psych Seizures -, Well Controlled,     GI/Hepatic Neg liver ROS, PUD,   Endo/Other  Hypothyroidism   Renal/GU negative Renal ROS     Musculoskeletal   Abdominal (+) + obese,   Peds negative pediatric ROS (+)  Hematology negative hematology ROS (+)   Anesthesia Other Findings   Reproductive/Obstetrics                             Anesthesia Physical Anesthesia Plan  ASA: II  Anesthesia Plan: General   Post-op Pain Management:    Induction: Intravenous  Airway Management Planned: Natural Airway and Nasal Cannula  Additional Equipment:   Intra-op Plan:   Post-operative Plan:   Informed Consent: I have reviewed the patients History and Physical, chart, labs and discussed the procedure including the risks, benefits and alternatives for the proposed anesthesia with the patient or authorized representative who has indicated his/her understanding and acceptance.     Plan Discussed with: CRNA  Anesthesia Plan Comments:         Anesthesia Quick Evaluation

## 2016-10-05 NOTE — Transfer of Care (Signed)
Immediate Anesthesia Transfer of Care Note  Patient: Jennifer Pratt  Procedure(s) Performed: Procedure(s) with comments: COLONOSCOPY (N/A) - Zosyn IVPB  Patient Location: PACU  Anesthesia Type:General  Level of Consciousness: awake and alert   Airway & Oxygen Therapy: Patient Spontanous Breathing and Patient connected to nasal cannula oxygen  Post-op Assessment: Report given to RN and Post -op Vital signs reviewed and stable  Post vital signs: Reviewed and stable  Last Vitals:  Vitals:   10/05/16 1347 10/05/16 1523  BP: (!) 153/92 114/81  Pulse: 79 77  Resp: 18 (!) 21  Temp: 37.3 C     Last Pain:  Vitals:   10/05/16 1523  TempSrc: Tympanic         Complications: No apparent anesthesia complications

## 2016-10-05 NOTE — H&P (Signed)
   Primary Care Physician:  Einar Pheasant, MD Primary Gastroenterologist:  Dr. Vira Agar  Pre-Procedure History & Physical: HPI:  Jennifer Pratt is a 55 y.o. female is here for an colonoscopy.   Past Medical History:  Diagnosis Date  . Arthritis   . Hypercholesterolemia   . Hypertension   . Hypothyroidism   . Seizures ([place])   . Ulcer ([place])    Gastric    Past Surgical History:  Procedure Laterality Date  . CESAREAN SECTION    . thumb surgery  2005  . THYROIDECTOMY  1998  . TOTAL KNEE ARTHROPLASTY Left 2012    Prior to Admission medications   Medication Sig Start Date End Date Taking? Authorizing Provider  carbamazepine (CARBATROL) 200 MG 12 hr capsule TAKE 1 CAPSULE (200 MG TOTAL) BY MOUTH 2 (TWO) TIMES DAILY. 12/14/15  Yes Kathrynn Ducking, MD  diclofenac (VOLTAREN) 75 MG EC tablet  08/28/13  Yes Historical Provider, MD  DULoxetine (CYMBALTA) 30 MG capsule TAKE 1 CAPSULE (30 MG TOTAL) BY MOUTH DAILY. 11/09/15  Yes Einar Pheasant, MD  levothyroxine (SYNTHROID, LEVOTHROID) 175 MCG tablet TAKE 1 TABLET BY MOUTH EVERY DAY 09/15/16  Yes Einar Pheasant, MD  pravastatin (PRAVACHOL) 10 MG tablet TAKE 1 TABLET (10 MG TOTAL) BY MOUTH DAILY. 09/20/16  Yes Einar Pheasant, MD  valsartan (DIOVAN) 80 MG tablet TAKE 1 TABLET (80 MG TOTAL) BY MOUTH DAILY. 09/09/16  Yes Einar Pheasant, MD    Allergies as of 08/15/2016 - Review Complete 07/02/2016  Allergen Reaction Noted  . Lisinopril  01/09/2013    Family History  Problem Relation Age of Onset  . Diabetes Father   . Dementia Mother   . Breast cancer Neg Hx   . Colon cancer Neg Hx     Social History   Social History  . Marital status: Married    Spouse name: N/A  . Number of children: 1  . Years of education: N/A   Occupational History  .  [employer]   Social History Main Topics  . Smoking status: Former Smoker    Packs/day: 0.25    Types: Cigarettes    Quit date: 01/09/1991  . Smokeless tobacco: Never Used  .  Alcohol use 0.0 oz/week     Comment: Socially  . Drug use: No  . Sexual activity: Yes   Other Topics Concern  . Not on file   Social History Narrative  . No narrative on file    Review of Systems: See HPI, otherwise negative ROS  Physical Exam: BP (!) 153/92   Pulse 79   Temp 99.2 F (37.3 C) (Tympanic)   Resp 18   Ht 5\' 9"  (1.753 m)   Wt 95.3 kg (210 lb)   LMP 10/09/2012   SpO2 100%   BMI 31.01 kg/m  General:   Alert,  pleasant and cooperative in NAD Head:  Normocephalic and atraumatic. Neck:  Supple; no masses or thyromegaly. Lungs:  Clear throughout to auscultation.    Heart:  Regular rate and rhythm. Abdomen:  Soft, nontender and nondistended. Normal bowel sounds, without guarding, and without rebound.   Neurologic:  Alert and  oriented x4;  grossly normal neurologically.  Impression/Plan: Jennifer Pratt is here for an colonoscopy to be performed for screening  Risks, benefits, limitations, and alternatives regarding  colonoscopy have been reviewed with the patient.  Questions have been answered.  All parties agreeable.   Gaylyn Cheers, MD  10/05/2016, 2:25 PM

## 2016-10-06 ENCOUNTER — Encounter: Payer: Self-pay | Admitting: Unknown Physician Specialty

## 2016-10-06 NOTE — Anesthesia Postprocedure Evaluation (Signed)
Anesthesia Post Note  Patient: SECRET CRADLE  Procedure(s) Performed: Procedure(s) (LRB): COLONOSCOPY (N/A)  Patient location during evaluation: PACU Anesthesia Type: General Level of consciousness: awake Pain management: pain level controlled Vital Signs Assessment: post-procedure vital signs reviewed and stable Respiratory status: spontaneous breathing Cardiovascular status: stable Anesthetic complications: no    Last Vitals:  Vitals:   10/05/16 1543 10/05/16 1553  BP: 123/85 131/83  Pulse: 69 67  Resp: 18 15  Temp:      Last Pain:  Vitals:   10/05/16 1523  TempSrc: Tympanic                 VAN STAVEREN,Leyani Gargus

## 2016-10-07 LAB — SURGICAL PATHOLOGY

## 2016-10-10 ENCOUNTER — Ambulatory Visit (INDEPENDENT_AMBULATORY_CARE_PROVIDER_SITE_OTHER): Payer: BLUE CROSS/BLUE SHIELD | Admitting: Nurse Practitioner

## 2016-10-10 ENCOUNTER — Encounter: Payer: Self-pay | Admitting: Nurse Practitioner

## 2016-10-10 VITALS — BP 155/92 | HR 79 | Ht 69.0 in | Wt 227.8 lb

## 2016-10-10 DIAGNOSIS — G40109 Localization-related (focal) (partial) symptomatic epilepsy and epileptic syndromes with simple partial seizures, not intractable, without status epilepticus: Secondary | ICD-10-CM

## 2016-10-10 DIAGNOSIS — Z5181 Encounter for therapeutic drug level monitoring: Secondary | ICD-10-CM | POA: Diagnosis not present

## 2016-10-10 DIAGNOSIS — G40909 Epilepsy, unspecified, not intractable, without status epilepticus: Secondary | ICD-10-CM | POA: Diagnosis not present

## 2016-10-10 DIAGNOSIS — G40309 Generalized idiopathic epilepsy and epileptic syndromes, not intractable, without status epilepticus: Secondary | ICD-10-CM | POA: Diagnosis not present

## 2016-10-10 MED ORDER — CARBAMAZEPINE ER 200 MG PO CP12: ORAL_CAPSULE | ORAL | 3 refills | Status: DC

## 2016-10-10 NOTE — Patient Instructions (Addendum)
Continue Carbatrol at current dose will refill Will check Carbatrol level today . Call for any seizure activity Follow-up yearly and when necessary

## 2016-10-10 NOTE — Progress Notes (Signed)
[facility]  PATIENT: Jennifer Pratt DOB: 1961-06-23   REASON FOR VISIT: Follow-up for seizure disorder HISTORY FROM: Patient    HISTORY OF PRESENT ILLNESS:Jennifer Pratt is a 55 year old right-handed white female with a history of seizures, well controlled on Carbatrol. The patient indicates that her last seizure event was 16 years ago. The patient has not had any seizures since being on Carbatrol. Her seizures used to be associated with her menstrual cycle, but she has not had a period in over three years. The patient has recently had a lot of problems with low back pain and sciatica bilaterally. The patient was told she had spinal stenosis. She has some numbness and tingling in the feet associated with this issue.  She continues to operate a motor vehicle. No other new significant medical issues have come up since last seen.   REVIEW OF SYSTEMS: Full 14 system review of systems performed and notable only for those listed, all others are neg:  Constitutional: neg  Cardiovascular: neg Ear/Nose/Throat: neg  Skin: neg Eyes: neg Respiratory: neg Gastroitestinal: neg  Hematology/Lymphatic: neg  Endocrine: neg Musculoskeletal: Spinal stenosis Allergy/Immunology: neg Neurological: neg Psychiatric: neg Sleep : neg   ALLERGIES: Allergies  Allergen Reactions  . Lisinopril     HOME MEDICATIONS: Outpatient Medications Prior to Visit  Medication Sig Dispense Refill  . carbamazepine (CARBATROL) 200 MG 12 hr capsule TAKE 1 CAPSULE (200 MG TOTAL) BY MOUTH 2 (TWO) TIMES DAILY. 180 capsule 0  . diclofenac (VOLTAREN) 75 MG EC tablet     . DULoxetine (CYMBALTA) 30 MG capsule TAKE 1 CAPSULE (30 MG TOTAL) BY MOUTH DAILY. 90 capsule 3  . levothyroxine (SYNTHROID, LEVOTHROID) 175 MCG tablet TAKE 1 TABLET BY MOUTH EVERY DAY 90 tablet 0  . pravastatin (PRAVACHOL) 10 MG tablet TAKE 1 TABLET (10 MG TOTAL) BY MOUTH DAILY. 90 tablet 1  . valsartan (DIOVAN) 80 MG tablet TAKE 1  TABLET (80 MG TOTAL) BY MOUTH DAILY. 30 tablet 5   No facility-administered medications prior to visit.     PAST MEDICAL HISTORY: Past Medical History:  Diagnosis Date  . Arthritis   . Hypercholesterolemia   . Hypertension   . Hypothyroidism   . Seizures ([place])   . Ulcer ([place])    Gastric    PAST SURGICAL HISTORY: Past Surgical History:  Procedure Laterality Date  . CESAREAN SECTION    . COLONOSCOPY N/A 10/05/2016   Procedure: COLONOSCOPY;  Surgeon: Manya Silvas, MD;  Location: [facility] ENDOSCOPY;  Service: Endoscopy;  Laterality: N/A;  Zosyn IVPB  . thumb surgery  2005  . THYROIDECTOMY  1998  . TOTAL KNEE ARTHROPLASTY Left 2012    FAMILY HISTORY: Family History  Problem Relation Age of Onset  . Diabetes Father   . Dementia Mother   . Breast cancer Neg Hx   . Colon cancer Neg Hx     SOCIAL HISTORY: Social History   Social History  . Marital status: Married    Spouse name: N/A  . Number of children: 1  . Years of education: N/A   Occupational History  .  [employer]   Social History Main Topics  . Smoking status: Former Smoker    Packs/day: 0.25    Types: Cigarettes    Quit date: 01/09/1991  . Smokeless tobacco: Never Used  . Alcohol use 0.0 oz/week     Comment: Socially  . Drug use: No  . Sexual activity: Yes   Other Topics Concern  . Not  on file   Social History Narrative  . No narrative on file     PHYSICAL EXAM  Vitals:   10/10/16 1552  BP: (!) 155/92  Pulse: 79  Weight: 227 lb 12.8 oz (103.3 kg)  Height: 5\' 9"  (1.753 m)   Body mass index is 33.64 kg/m. General: The patient is alert and cooperative at the time of the examination. The patient is moderately obese. Skin: No significant peripheral edema is noted. Neurologic Exam Mental status: The patient is oriented x 3. Cranial nerves: Facial symmetry is present. Speech is normal, no aphasia or dysarthria is noted. Extraocular movements are full. Visual fields are full. Motor: The  patient has good strength in all 4 extremities. Sensory examination: Soft touch sensation is symmetric on the face, arms, and legs. Coordination: The patient has good finger-nose-finger and heel-to-shin bilaterally. Gait and station: The patient has a normal gait. Tandem gait is normal. Romberg is negative. No drift is seen. Reflexes: Deep tendon reflexes are symmetric upper and lower.    DIAGNOSTIC DATA (LABS, IMAGING, TESTING) - I reviewed patient records, labs, notes, testing and imaging myself where available.  Lab Results  Component Value Date   WBC 5.0 05/04/2016   HGB 13.5 05/04/2016   HCT 39.8 05/04/2016   MCV 89.0 05/04/2016   PLT 238.0 05/04/2016      Component Value Date/Time   NA 144 09/13/2016 0801   K 3.5 09/13/2016 0801   CL 103 09/13/2016 0801   CO2 33 (H) 09/13/2016 0801   GLUCOSE 94 09/13/2016 0801   BUN 17 09/13/2016 0801   CREATININE 0.80 09/13/2016 0801   CALCIUM 9.7 09/13/2016 0801   PROT 7.8 09/13/2016 0801   ALBUMIN 4.9 09/13/2016 0801   AST 20 09/13/2016 0801   ALT 24 09/13/2016 0801   ALKPHOS 76 09/13/2016 0801   BILITOT 0.4 09/13/2016 0801   GFRNONAA >60 08/12/2011 0512   GFRAA >60 08/12/2011 0512   Lab Results  Component Value Date   CHOL 201 (H) 09/13/2016   HDL 56.90 09/13/2016   LDLCALC 113 (H) 09/13/2016   LDLDIRECT 112.0 06/04/2015   TRIG 155.0 (H) 09/13/2016   CHOLHDL 4 09/13/2016   Lab Results  Component Value Date   HGBA1C 5.1 09/13/2016    Lab Results  Component Value Date   TSH 0.81 05/04/2016      ASSESSMENT AND PLAN  55 y.o. year old female  has a past medical history of seizure disorder here to follow-up. She has not had any seizure events for 16 years. She is well controlled on Carbatrol.  PLAN Continue Carbatrol at current dose will refill Will check Carbatrol level today .Reviewed CBC and liver profile from June 2017 Call for any seizure activity Follow-up yearly and when necessary Dennie Bible,  [facility], [facility], [facility] [address] [place], [place] [address] [phone]

## 2016-10-11 LAB — CARBAMAZEPINE LEVEL, TOTAL: CARBAMAZEPINE LVL: 7.2 ug/mL (ref 4.0–12.0)

## 2016-10-12 ENCOUNTER — Encounter: Payer: Self-pay | Admitting: Internal Medicine

## 2016-10-12 ENCOUNTER — Other Ambulatory Visit: Payer: Self-pay | Admitting: Internal Medicine

## 2016-10-12 ENCOUNTER — Telehealth: Payer: Self-pay | Admitting: *Deleted

## 2016-10-12 NOTE — Telephone Encounter (Signed)
Spoke to pt and relayed that the lab results looked good.  She verbalized understanding.

## 2016-10-12 NOTE — Telephone Encounter (Signed)
-----   Message from Dennie Bible, NP sent at 10/12/2016  8:12 AM EST ----- Lab look good. Please call patient

## 2016-10-25 ENCOUNTER — Encounter: Payer: Self-pay | Admitting: Internal Medicine

## 2016-11-04 ENCOUNTER — Ambulatory Visit: Payer: BLUE CROSS/BLUE SHIELD | Admitting: Internal Medicine

## 2016-11-07 ENCOUNTER — Encounter: Payer: Self-pay | Admitting: Internal Medicine

## 2016-12-15 ENCOUNTER — Other Ambulatory Visit: Payer: Self-pay | Admitting: Internal Medicine

## 2016-12-23 ENCOUNTER — Other Ambulatory Visit: Payer: Self-pay | Admitting: Nurse Practitioner

## 2017-01-18 ENCOUNTER — Other Ambulatory Visit: Payer: Self-pay | Admitting: Internal Medicine

## 2017-01-20 ENCOUNTER — Other Ambulatory Visit: Payer: Self-pay | Admitting: Internal Medicine

## 2017-01-20 NOTE — Telephone Encounter (Signed)
It looks like this was refilled on 01/18/17 with instructions for f/u appt.  Please confirm.  If not refilled, ok to refill x 1 and schedule appt.

## 2017-01-20 NOTE — Telephone Encounter (Signed)
Spoke with pt and was able to schedule her an appt for May 4th @ 8:00am. The pt stated that she would be out of her valsartan before then and was wondering if it would be okay to give enough refills to get her through till May 4th. Please advise.

## 2017-01-20 NOTE — Telephone Encounter (Signed)
Looks like this medication was discontinued. Please advise.

## 2017-01-22 NOTE — Telephone Encounter (Signed)
rx ok'd for valsartan #30 with 2 refills.

## 2017-03-02 DIAGNOSIS — G4733 Obstructive sleep apnea (adult) (pediatric): Secondary | ICD-10-CM | POA: Diagnosis not present

## 2017-03-31 ENCOUNTER — Ambulatory Visit (INDEPENDENT_AMBULATORY_CARE_PROVIDER_SITE_OTHER): Payer: BLUE CROSS/BLUE SHIELD | Admitting: Internal Medicine

## 2017-03-31 ENCOUNTER — Encounter: Payer: Self-pay | Admitting: Internal Medicine

## 2017-03-31 VITALS — BP 136/78 | HR 70 | Temp 98.6°F | Resp 12 | Ht 69.0 in | Wt 228.8 lb

## 2017-03-31 DIAGNOSIS — L989 Disorder of the skin and subcutaneous tissue, unspecified: Secondary | ICD-10-CM

## 2017-03-31 DIAGNOSIS — G4733 Obstructive sleep apnea (adult) (pediatric): Secondary | ICD-10-CM

## 2017-03-31 DIAGNOSIS — F439 Reaction to severe stress, unspecified: Secondary | ICD-10-CM

## 2017-03-31 DIAGNOSIS — E039 Hypothyroidism, unspecified: Secondary | ICD-10-CM

## 2017-03-31 DIAGNOSIS — I10 Essential (primary) hypertension: Secondary | ICD-10-CM

## 2017-03-31 DIAGNOSIS — G40309 Generalized idiopathic epilepsy and epileptic syndromes, not intractable, without status epilepticus: Secondary | ICD-10-CM | POA: Diagnosis not present

## 2017-03-31 DIAGNOSIS — E7800 Pure hypercholesterolemia, unspecified: Secondary | ICD-10-CM

## 2017-03-31 DIAGNOSIS — G40909 Epilepsy, unspecified, not intractable, without status epilepticus: Secondary | ICD-10-CM

## 2017-03-31 LAB — CBC WITH DIFFERENTIAL/PLATELET
Basophils Absolute: 0.1 10*3/uL (ref 0.0–0.1)
Basophils Relative: 1.7 % (ref 0.0–3.0)
EOS ABS: 0.1 10*3/uL (ref 0.0–0.7)
Eosinophils Relative: 1.9 % (ref 0.0–5.0)
HCT: 41 % (ref 36.0–46.0)
Hemoglobin: 13.8 g/dL (ref 12.0–15.0)
LYMPHS ABS: 2.1 10*3/uL (ref 0.7–4.0)
Lymphocytes Relative: 37.8 % (ref 12.0–46.0)
MCHC: 33.7 g/dL (ref 30.0–36.0)
MCV: 90.8 fl (ref 78.0–100.0)
MONO ABS: 0.4 10*3/uL (ref 0.1–1.0)
Monocytes Relative: 6.4 % (ref 3.0–12.0)
NEUTROS PCT: 52.2 % (ref 43.0–77.0)
Neutro Abs: 2.9 10*3/uL (ref 1.4–7.7)
Platelets: 249 10*3/uL (ref 150.0–400.0)
RBC: 4.51 Mil/uL (ref 3.87–5.11)
RDW: 13.3 % (ref 11.5–15.5)
WBC: 5.5 10*3/uL (ref 4.0–10.5)

## 2017-03-31 LAB — HEPATIC FUNCTION PANEL
ALBUMIN: 4.8 g/dL (ref 3.5–5.2)
ALT: 27 U/L (ref 0–35)
AST: 16 U/L (ref 0–37)
Alkaline Phosphatase: 73 U/L (ref 39–117)
BILIRUBIN DIRECT: 0.1 mg/dL (ref 0.0–0.3)
BILIRUBIN TOTAL: 0.5 mg/dL (ref 0.2–1.2)
Total Protein: 7.2 g/dL (ref 6.0–8.3)

## 2017-03-31 LAB — LIPID PANEL
CHOLESTEROL: 241 mg/dL — AB (ref 0–200)
HDL: 60.9 mg/dL (ref >39.00)
LDL Cholesterol: 141 mg/dL — ABNORMAL HIGH (ref 0–99)
NonHDL: 180.48
Total CHOL/HDL Ratio: 4
Triglycerides: 195 mg/dL — ABNORMAL HIGH (ref 0.0–149.0)
VLDL: 39 mg/dL (ref 0.0–40.0)

## 2017-03-31 LAB — BASIC METABOLIC PANEL
BUN: 17 mg/dL (ref 6–23)
CALCIUM: 9.5 mg/dL (ref 8.4–10.5)
CO2: 28 meq/L (ref 19–32)
Chloride: 105 mEq/L (ref 96–112)
Creatinine, Ser: 0.87 mg/dL (ref 0.40–1.20)
GFR: 71.8 mL/min (ref >60.00)
GLUCOSE: 99 mg/dL (ref 70–99)
POTASSIUM: 3.9 meq/L (ref 3.5–5.1)
SODIUM: 141 meq/L (ref 135–145)

## 2017-03-31 LAB — TSH: TSH: 1.93 u[IU]/mL (ref 0.35–4.50)

## 2017-03-31 MED ORDER — DULOXETINE HCL 60 MG PO CPEP: 60.0000 mg | ORAL_CAPSULE | Freq: Every day | ORAL | 2 refills | Status: DC

## 2017-03-31 NOTE — Progress Notes (Signed)
Patient ID: Jennifer Pratt, female   DOB: 1961-01-07, 56 y.o.   MRN: [id number]   Subjective:    Patient ID: Jennifer Pratt, female    DOB: May 01, 1961, 56 y.o.   MRN: [id number]  HPI  Patient here for a scheduled follow up.  She has a history of seizure disorders.  Followed by neurology.  Evaluated 09/2016.  Stable.  No seizures in 16 years.  On carbatrol. Recommended f/u in one year.  Stays active.  No chest pain.  No sob.  Increased stress.  Taking care of her mother.  Feels she is handling things relatively well.  Does feel needs to increase cymbalta and see if this helps.   No abdominal pain.  Bowels moving.  Pain is controlled with cymbalta and antiinflammatories.     Past Medical History:  Diagnosis Date  . Arthritis   . Hypercholesterolemia   . Hypertension   . Hypothyroidism   . Seizures ([place])   . Ulcer    Gastric   Past Surgical History:  Procedure Laterality Date  . CESAREAN SECTION    . COLONOSCOPY N/A 10/05/2016   Procedure: COLONOSCOPY;  Surgeon: Manya Silvas, MD;  Location: [facility] ENDOSCOPY;  Service: Endoscopy;  Laterality: N/A;  Zosyn IVPB  . thumb surgery  2005  . THYROIDECTOMY  1998  . TOTAL KNEE ARTHROPLASTY Left 2012   Family History  Problem Relation Age of Onset  . Diabetes Father   . Dementia Mother   . Breast cancer Neg Hx   . Colon cancer Neg Hx    Social History   Social History  . Marital status: Married    Spouse name: N/A  . Number of children: 1  . Years of education: N/A   Occupational History  .  [employer]   Social History Main Topics  . Smoking status: Former Smoker    Packs/day: 0.25    Types: Cigarettes    Quit date: 01/09/1991  . Smokeless tobacco: Never Used  . Alcohol use 0.0 oz/week     Comment: Socially  . Drug use: No  . Sexual activity: Yes   Other Topics Concern  . None   Social History Narrative  . None    Outpatient Encounter Prescriptions as of 03/31/2017  Medication Sig  . carbamazepine (CARBATROL)  200 MG 12 hr capsule TAKE 1 CAPSULE (200 MG TOTAL) BY MOUTH 2 (TWO) TIMES DAILY.  Marland Kitchen diclofenac (VOLTAREN) 75 MG EC tablet   . levothyroxine (SYNTHROID, LEVOTHROID) 175 MCG tablet TAKE 1 TABLET BY MOUTH EVERY DAY  . [DISCONTINUED] DULoxetine (CYMBALTA) 30 MG capsule TAKE 1 CAPSULE (30 MG TOTAL) BY MOUTH DAILY.  . [DISCONTINUED] pravastatin (PRAVACHOL) 10 MG tablet TAKE 1 TABLET (10 MG TOTAL) BY MOUTH DAILY.  . [DISCONTINUED] valsartan (DIOVAN) 80 MG tablet Take 1 tablet (80 mg total) by mouth daily. PLEASE RESCHEDULE CANCELLED APPT FOR FURTHER REFILLS  . [DISCONTINUED] valsartan (DIOVAN) 80 MG tablet TAKE 1 TABLET (80 MG TOTAL) BY MOUTH DAILY.  . DULoxetine (CYMBALTA) 60 MG capsule Take 1 capsule (60 mg total) by mouth daily.   No facility-administered encounter medications on file as of 03/31/2017.     Review of Systems  Constitutional: Negative for appetite change and unexpected weight change.  HENT: Negative for congestion and sinus pressure.   Respiratory: Negative for cough, chest tightness and shortness of breath.   Cardiovascular: Negative for chest pain, palpitations and leg swelling.  Gastrointestinal: Negative for abdominal pain, diarrhea, nausea and vomiting.  Genitourinary:  Negative for difficulty urinating and dysuria.  Musculoskeletal: Positive for back pain. Negative for joint swelling.  Skin: Negative for color change and rash.  Neurological: Negative for dizziness, light-headedness and headaches.  Psychiatric/Behavioral: Negative for agitation and dysphoric mood.       Objective:     Blood pressure rechecked by me:  136/78  Physical Exam  Constitutional: She is oriented to person, place, and time. She appears well-developed and well-nourished. No distress.  HENT:  Nose: Nose normal.  Mouth/Throat: Oropharynx is clear and moist.  Neck: Neck supple. No thyromegaly present.  Cardiovascular: Normal rate and regular rhythm.   Pulmonary/Chest: Breath sounds normal. No  accessory muscle usage. No tachypnea. No respiratory distress. She has no decreased breath sounds. She has no wheezes. She has no rhonchi. Right breast exhibits no inverted nipple, no mass, no nipple discharge and no tenderness (no axillary adenopathy). Left breast exhibits no inverted nipple, no mass, no nipple discharge and no tenderness (no axilarry adenopathy).  Abdominal: Soft. Bowel sounds are normal. There is no tenderness.  Musculoskeletal: She exhibits no edema or tenderness.  Lymphadenopathy:    She has no cervical adenopathy.  Neurological: She is alert and oriented to person, place, and time.  Skin: Skin is warm. No rash noted. No erythema.  Psychiatric: She has a normal mood and affect. Her behavior is normal.    BP 136/78   Pulse 70   Temp 98.6 F (37 C) (Oral)   Resp 12   Ht 5\' 9"  (1.753 m)   Wt 228 lb 12.8 oz (103.8 kg)   LMP 10/09/2012   SpO2 98%   BMI 33.79 kg/m  Wt Readings from Last 3 Encounters:  03/31/17 228 lb 12.8 oz (103.8 kg)  10/10/16 227 lb 12.8 oz (103.3 kg)  10/05/16 210 lb (95.3 kg)     Lab Results  Component Value Date   WBC 5.5 03/31/2017   HGB 13.8 03/31/2017   HCT 41.0 03/31/2017   PLT 249.0 03/31/2017   GLUCOSE 99 03/31/2017   CHOL 241 (H) 03/31/2017   TRIG 195.0 (H) 03/31/2017   HDL 60.90 03/31/2017   LDLDIRECT 112.0 06/04/2015   LDLCALC 141 (H) 03/31/2017   ALT 27 03/31/2017   AST 16 03/31/2017   NA 141 03/31/2017   K 3.9 03/31/2017   CL 105 03/31/2017   CREATININE 0.87 03/31/2017   BUN 17 03/31/2017   CO2 28 03/31/2017   TSH 1.93 03/31/2017   INR 1.41 08/12/2011   HGBA1C 5.1 09/13/2016       Assessment & Plan:   Problem List Items Addressed This Visit    Essential hypertension, benign - Primary    Blood pressure under good control.  Continue same medication regimen.  Follow pressures.  Follow metabolic panel.        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  Currently stable.  On carbatrol.        Hypercholesterolemia    On pravastatin.  Low cholesterol diet and exercise.  Follow lipid panel and liver function tests.        Relevant Orders   Lipid panel (Completed)   Hepatic function panel (Completed)   Hypothyroidism    On thyroid replacement.  Follow tsh.        Relevant Orders   TSH (Completed)   Obstructive sleep apnea    CPAP.  Seizure disorder ([place])   Stress    Increased stress as outlined.  Increase cymbalta.  Follow.         Other Visit Diagnoses    Skin lesion of right arm       skin lesion - right arm.  refer to dermatology.    Relevant Orders   Ambulatory referral to Dermatology       Einar Pheasant, MD

## 2017-03-31 NOTE — Progress Notes (Signed)
Pre-visit discussion using our clinic review tool. No additional management support is needed unless otherwise documented below in the visit note.  

## 2017-04-03 ENCOUNTER — Other Ambulatory Visit: Payer: Self-pay | Admitting: *Deleted

## 2017-04-03 MED ORDER — PRAVASTATIN SODIUM 20 MG PO TABS: ORAL_TABLET | ORAL | 3 refills | Status: DC

## 2017-04-03 NOTE — Progress Notes (Unsigned)
Notified patient of results and patient voiced understanding, new script for 20 mg pravastatin sent to pharmacy.

## 2017-04-06 ENCOUNTER — Other Ambulatory Visit: Payer: Self-pay

## 2017-04-06 MED ORDER — VALSARTAN 80 MG PO TABS: 80.0000 mg | ORAL_TABLET | Freq: Every day | ORAL | 1 refills | Status: DC

## 2017-04-09 ENCOUNTER — Encounter: Payer: Self-pay | Admitting: Internal Medicine

## 2017-04-09 DIAGNOSIS — F439 Reaction to severe stress, unspecified: Secondary | ICD-10-CM | POA: Insufficient documentation

## 2017-04-09 NOTE — Assessment & Plan Note (Signed)
Increased stress as outlined.  Increase cymbalta.  Follow.

## 2017-04-09 NOTE — Assessment & Plan Note (Signed)
On pravastatin.  Low cholesterol diet and exercise.  Follow lipid panel and liver function tests.   

## 2017-04-09 NOTE — Assessment & Plan Note (Signed)
CPAP.  

## 2017-04-09 NOTE — Assessment & Plan Note (Signed)
Blood pressure under good control.  Continue same medication regimen.  Follow pressures.  Follow metabolic panel.   

## 2017-04-09 NOTE — Assessment & Plan Note (Signed)
On thyroid replacement.  Follow tsh.  

## 2017-04-09 NOTE — Assessment & Plan Note (Signed)
Followed by Dr Jannifer Franklin and Cecille Rubin.  Currently stable.  On carbatrol.

## 2017-04-27 ENCOUNTER — Encounter: Payer: Self-pay | Admitting: Internal Medicine

## 2017-04-27 MED ORDER — DICLOFENAC SODIUM 75 MG PO TBEC
75.0000 mg | DELAYED_RELEASE_TABLET | Freq: Two times a day (BID) | ORAL | 0 refills | Status: DC | PRN
Start: 2017-04-27 — End: 2019-09-11

## 2017-04-27 NOTE — Telephone Encounter (Signed)
I have sent in rx for diclofenac.  Will refill x 1 only.  Per note, will get next refill from ortho.  Need to take as needed.  Monitor for any GI issues and make sure no blood pressure elevation on medication.

## 2017-05-09 DIAGNOSIS — M5416 Radiculopathy, lumbar region: Secondary | ICD-10-CM | POA: Diagnosis not present

## 2017-05-15 ENCOUNTER — Other Ambulatory Visit: Payer: Self-pay | Admitting: Internal Medicine

## 2017-05-24 ENCOUNTER — Other Ambulatory Visit: Payer: Self-pay | Admitting: Internal Medicine

## 2017-05-24 DIAGNOSIS — Z1231 Encounter for screening mammogram for malignant neoplasm of breast: Secondary | ICD-10-CM

## 2017-05-26 ENCOUNTER — Other Ambulatory Visit: Payer: Self-pay

## 2017-05-26 MED ORDER — DULOXETINE HCL 60 MG PO CPEP: 60.0000 mg | ORAL_CAPSULE | Freq: Every day | ORAL | 0 refills | Status: DC

## 2017-06-06 DIAGNOSIS — M5416 Radiculopathy, lumbar region: Secondary | ICD-10-CM | POA: Diagnosis not present

## 2017-06-07 ENCOUNTER — Ambulatory Visit
Admission: RE | Admit: 2017-06-07 | Discharge: 2017-06-07 | Disposition: A | Discharge status: Home / Self Care | Payer: BLUE CROSS/BLUE SHIELD | Source: Ambulatory Visit | Attending: Internal Medicine | Admitting: Internal Medicine

## 2017-06-07 DIAGNOSIS — R928 Other abnormal and inconclusive findings on diagnostic imaging of breast: Secondary | ICD-10-CM | POA: Insufficient documentation

## 2017-06-07 DIAGNOSIS — R921 Mammographic calcification found on diagnostic imaging of breast: Secondary | ICD-10-CM | POA: Insufficient documentation

## 2017-06-07 DIAGNOSIS — Z1231 Encounter for screening mammogram for malignant neoplasm of breast: Secondary | ICD-10-CM | POA: Diagnosis not present

## 2017-06-09 ENCOUNTER — Other Ambulatory Visit: Payer: Self-pay | Admitting: Internal Medicine

## 2017-06-09 DIAGNOSIS — R921 Mammographic calcification found on diagnostic imaging of breast: Secondary | ICD-10-CM

## 2017-06-09 DIAGNOSIS — R928 Other abnormal and inconclusive findings on diagnostic imaging of breast: Secondary | ICD-10-CM

## 2017-06-19 ENCOUNTER — Other Ambulatory Visit: Payer: Self-pay | Admitting: Internal Medicine

## 2017-06-19 ENCOUNTER — Ambulatory Visit
Admission: RE | Admit: 2017-06-19 | Discharge: 2017-06-19 | Disposition: A | Discharge status: Home / Self Care | Payer: BLUE CROSS/BLUE SHIELD | Source: Ambulatory Visit | Attending: Internal Medicine | Admitting: Internal Medicine

## 2017-06-19 DIAGNOSIS — R921 Mammographic calcification found on diagnostic imaging of breast: Secondary | ICD-10-CM

## 2017-06-19 DIAGNOSIS — R928 Other abnormal and inconclusive findings on diagnostic imaging of breast: Secondary | ICD-10-CM

## 2017-06-28 ENCOUNTER — Ambulatory Visit
Admission: RE | Admit: 2017-06-28 | Discharge: 2017-06-28 | Disposition: A | Discharge status: Home / Self Care | Payer: BLUE CROSS/BLUE SHIELD | Source: Ambulatory Visit | Attending: Internal Medicine | Admitting: Internal Medicine

## 2017-06-28 ENCOUNTER — Encounter: Payer: Self-pay | Admitting: Internal Medicine

## 2017-06-28 DIAGNOSIS — D0511 Intraductal carcinoma in situ of right breast: Secondary | ICD-10-CM | POA: Insufficient documentation

## 2017-06-28 DIAGNOSIS — R928 Other abnormal and inconclusive findings on diagnostic imaging of breast: Secondary | ICD-10-CM

## 2017-06-28 DIAGNOSIS — R921 Mammographic calcification found on diagnostic imaging of breast: Secondary | ICD-10-CM

## 2017-06-28 DIAGNOSIS — C50919 Malignant neoplasm of unspecified site of unspecified female breast: Secondary | ICD-10-CM

## 2017-06-28 HISTORY — DX: Malignant neoplasm of unspecified site of unspecified female breast: C50.919

## 2017-06-28 HISTORY — PX: BREAST BIOPSY: SHX20

## 2017-06-29 ENCOUNTER — Other Ambulatory Visit: Payer: Self-pay | Admitting: Pathology

## 2017-06-29 LAB — SURGICAL PATHOLOGY

## 2017-06-29 NOTE — Progress Notes (Unsigned)
mdt  

## 2017-06-30 ENCOUNTER — Other Ambulatory Visit: Payer: Self-pay | Admitting: Internal Medicine

## 2017-06-30 DIAGNOSIS — D0511 Intraductal carcinoma in situ of right breast: Secondary | ICD-10-CM

## 2017-06-30 NOTE — Progress Notes (Signed)
Order placed for surgery referral.  

## 2017-07-03 ENCOUNTER — Encounter: Payer: Self-pay | Admitting: General Surgery

## 2017-07-04 ENCOUNTER — Encounter: Payer: Self-pay | Admitting: *Deleted

## 2017-07-04 NOTE — Progress Notes (Signed)
  Oncology Nurse Navigator Documentation  Navigator Location: [facility] (07/04/17 1100)   )Navigator Encounter Type: Telephone;Introductory phone call (07/04/17 1100) Telephone: Outgoing Call (07/04/17 1100) Abnormal Finding Date: 06/19/17 (07/04/17 1100) Confirmed Diagnosis Date: 06/29/17 (07/04/17 1100)                                              Time Spent with Patient: 15 (07/04/17 1100)   Called patient to establish navigation services. Patient is newly diagnosed with DCIS.  States she is scheduled to  see Dr. Bary Castilla on Thursday, 07/06/17.  Informed her I would leave some educational literature there for her.  She is a agreeable.  She is to call if she has any questions or needs.

## 2017-07-06 ENCOUNTER — Encounter: Payer: Self-pay | Admitting: *Deleted

## 2017-07-06 ENCOUNTER — Ambulatory Visit (INDEPENDENT_AMBULATORY_CARE_PROVIDER_SITE_OTHER): Payer: BLUE CROSS/BLUE SHIELD | Admitting: General Surgery

## 2017-07-06 ENCOUNTER — Inpatient Hospital Stay: Payer: Self-pay

## 2017-07-06 VITALS — BP 154/92 | HR 94 | Resp 16 | Ht 69.0 in | Wt 232.0 lb

## 2017-07-06 DIAGNOSIS — D0511 Intraductal carcinoma in situ of right breast: Secondary | ICD-10-CM | POA: Diagnosis not present

## 2017-07-06 NOTE — Progress Notes (Signed)
Patient ID: Jennifer Pratt, female   DOB: [birth year], 56 y.o.   MRN: [id number]  Chief Complaint  Patient presents with  . Breast Problem    HPI Jennifer Pratt is a 56 y.o. female who presents for a breast evaluation. The most recent mammogram and right breast biopsy was done on 06/28/17. Patient does perform regular self breast checks and gets regular mammograms done. She did not feel a lump, denies tenderness. Patient was called with results by her primary care doctor and the radiologists. Patient denies family history of breast cancer, her father's side is unknown.  Jeraldine Loots, her husband is present-married 35 years.  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 She also takes care of her mother (5 years) that has dementia. She has one son, 19 years old.    HPI  Past Medical History:  Diagnosis Date  . Arthritis   . Breast cancer ([place]) 06/28/2017   UOQ/DUCTAL CARCINOMA IN SITU, HIGH NUCLEAR GRADE WITH COMEDONECROSIS   . Hypercholesterolemia   . Hypertension   . Hypothyroidism   . Seizures ([place])   . Ulcer    Gastric    Past Surgical History:  Procedure Laterality Date  . BREAST BIOPSY Right 06/28/2017   Stereo affirm- UOQ/DUCTAL CARCINOMA IN SITU, HIGH NUCLEAR GRADE WITH COMEDONECROSIS   . CESAREAN SECTION    . COLONOSCOPY N/A 10/05/2016   Procedure: COLONOSCOPY;  Surgeon: Manya Silvas, MD;  Location: [facility] ENDOSCOPY;  Service: Endoscopy;  Laterality: N/A;  Zosyn IVPB  . thumb surgery  2005  . THYROIDECTOMY  1988  . TOTAL KNEE ARTHROPLASTY Left 2012    Family History  Problem Relation Age of Onset  . Diabetes Father   . Dementia Mother   . Breast cancer Neg Hx   . Colon cancer Neg Hx     Social History Social History  Substance Use Topics  . Smoking status: Former Smoker    Packs/day: 0.25    Types: Cigarettes    Quit date: 01/09/1991  . Smokeless tobacco: Never Used  . Alcohol use 0.0 oz/week     Comment: Socially    Allergies  Allergen Reactions   . Lisinopril Other (See Comments)    UNKNOWN     Current Outpatient Prescriptions  Medication Sig Dispense Refill  . carbamazepine (CARBATROL) 200 MG 12 hr capsule TAKE 1 CAPSULE (200 MG TOTAL) BY MOUTH 2 (TWO) TIMES DAILY. 180 capsule 3  . diclofenac (VOLTAREN) 75 MG EC tablet Take 1 tablet (75 mg total) by mouth 2 (two) times daily as needed. 60 tablet 0  . DULoxetine (CYMBALTA) 60 MG capsule Take 1 capsule (60 mg total) by mouth daily. (Patient taking differently: Take 60 mg by mouth at bedtime. ) 90 capsule 0  . levothyroxine (SYNTHROID, LEVOTHROID) 175 MCG tablet TAKE 1 TABLET BY MOUTH EVERY DAY 90 tablet 1  . pravastatin (PRAVACHOL) 10 MG tablet TAKE 1 TABLET (10 MG TOTAL) BY MOUTH DAILY. 90 tablet 1  . valsartan (DIOVAN) 80 MG tablet Take 1 tablet (80 mg total) by mouth daily. PLEASE RESCHEDULE CANCELLED APPT FOR FURTHER REFILLS (Patient taking differently: Take 80 mg by mouth at bedtime. PLEASE RESCHEDULE CANCELLED APPT FOR FURTHER REFILLS) 90 tablet 1  . fluticasone (FLONASE) 50 MCG/ACT nasal spray Place 1 spray into both nostrils daily as needed for allergies or rhinitis.     No current facility-administered medications for this visit.     Review of Systems Review of Systems  Constitutional: Negative.   Respiratory:  Negative.   Cardiovascular: Negative.     Blood pressure (!) 154/92, pulse 94, resp. rate 16, height 5\' 9"  (1.753 m), weight 232 lb (105.2 kg), last menstrual period 10/09/2012.  Physical Exam Physical Exam  Constitutional: She is oriented to person, place, and time. She appears well-developed and well-nourished.  Eyes: Conjunctivae are normal. No scleral icterus.  Neck: Neck supple.  Cardiovascular: Normal rate, regular rhythm and normal heart sounds.   Pulmonary/Chest: Effort normal and breath sounds normal. Right breast exhibits no inverted nipple, no mass, no nipple discharge, no skin change and no tenderness. Left breast exhibits no inverted nipple, no  mass, no nipple discharge, no skin change and no tenderness.    Right breast-large bruise lower inner quadrant  Lymphadenopathy:    She has no cervical adenopathy.    She has no axillary adenopathy.       Right: No supraclavicular adenopathy present.  Neurological: She is alert and oriented to person, place, and time.  Skin: Skin is warm and dry.    Data Reviewed Screening mammograms of 06/19/2017 were compared to prior studies. Microcalcifications in the right breast. Confirmed on diagnostic mammograms 06/28/2017.  Stereotactic biopsy dated 06/28/2017 images reviewed.  DIAGNOSIS:  A. RIGHT BREAST, UPPER OUTER QUADRANT; STEREOTACTIC BIOPSY:  - DUCTAL CARCINOMA IN SITU, HIGH NUCLEAR GRADE WITH COMEDONECROSIS AND  CALCIFICATIONS.   Ultrasound examination of the right breast was completed to determine if preoperative needle localization would be required. There is a sizable biopsy cavity corresponding to the palpable thickening at 8:00 position measuring 1.09 x 1.16 x 1.17. Biopsy clip is notable within the cavity. This is 9 cm from the nipple. BI-RADS-6.   Review of the post biopsy clip placement films showed residual calcifications anterior and lateral to the biopsy cavity.  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    Plan    Options for management were reviewed with the patient an informational website address his provided.  Opportunity for second surgical opinion reviewed. Potential role for antiestrogen therapy based on hormone receptor testing briefly touched upon.  The majority of the visit was spent reviewing options for management.      HPI, Physical Exam, Assessment and Plan have been scribed under the direction and in the presence of Hervey Ard, MD.  Verlene Mayer, CMA  I have completed the exam and reviewed the above documentation for accuracy and completeness.  I agree with the above.  Haematologist has  been used and any errors in dictation or transcription are unintentional.  Hervey Ard, M.D., F.A.C.S.     Robert Bellow 07/08/2017, 6:07 AM  Patient's surgery has been scheduled for 07-17-17 at [facility].   Dominga Ferry, CMA

## 2017-07-08 DIAGNOSIS — D0511 Intraductal carcinoma in situ of right breast: Secondary | ICD-10-CM | POA: Insufficient documentation

## 2017-07-11 ENCOUNTER — Encounter: Payer: Self-pay | Admitting: Internal Medicine

## 2017-07-11 ENCOUNTER — Encounter
Admission: RE | Admit: 2017-07-11 | Discharge: 2017-07-11 | Disposition: A | Discharge status: Home / Self Care | Payer: BLUE CROSS/BLUE SHIELD | Source: Ambulatory Visit | Attending: General Surgery | Admitting: General Surgery

## 2017-07-11 DIAGNOSIS — I10 Essential (primary) hypertension: Secondary | ICD-10-CM | POA: Insufficient documentation

## 2017-07-11 DIAGNOSIS — Z01810 Encounter for preprocedural cardiovascular examination: Secondary | ICD-10-CM | POA: Insufficient documentation

## 2017-07-11 HISTORY — DX: Sleep apnea, unspecified: G47.30

## 2017-07-11 NOTE — Patient Instructions (Signed)
  Your procedure is scheduled on: 07-17-17 MONDAY Report to Same Day Surgery 2nd floor medical mall [facility] Entrance-take elevator on left to 2nd floor.  Check in with surgery information desk.) To find out your arrival time please call [phone] between 1PM - 3PM on 07-14-17 FRIDAY  Remember: Instructions that are not followed completely may result in serious medical risk, up to and including death, or upon the discretion of your surgeon and anesthesiologist your surgery may need to be rescheduled.    _x___ 1. Do not eat food or drink liquids after midnight. No gum chewing or hard candies.     __x__ 2. No Alcohol for 24 hours before or after surgery.   __x__3. No Smoking for 24 prior to surgery.   ____  4. Bring all medications with you on the day of surgery if instructed.    __x__ 5. Notify your doctor if there is any change in your medical condition     (cold, fever, infections).     Do not wear jewelry, make-up, hairpins, clips or nail polish.  Do not wear lotions, powders, or perfumes. You may wear deodorant.  Do not shave 48 hours prior to surgery. Men may shave face and neck.  Do not bring valuables to the hospital.    [facility] is not responsible for any belongings or valuables.               Contacts, dentures or bridgework may not be worn into surgery.  Leave your suitcase in the car. After surgery it may be brought to your room.  For patients admitted to the hospital, discharge time is determined by your treatment team.   Patients discharged the day of surgery will not be allowed to drive home.  You will need someone to drive you home and stay with you the night of your procedure.    Please read over the following fact sheets that you were given:      _x___ [place] WITH A SMALL SIP OF WATER. These include:  1. CARBAMAZEPINE  2. LEVOTHYROXINE  3. PRAVASTATIN  4.  5.  6.  ____Fleets enema or Magnesium Citrate as  directed.   _x___ Use CHG Soap or sage wipes as directed on instruction sheet   ____ Use inhalers on the day of surgery and bring to hospital day of surgery  ____ Stop Metformin and Janumet 2 days prior to surgery.    ____ Take 1/2 of usual insulin dose the night before surgery and none on the morning surgery.   ____ Follow recommendations from Cardiologist, Pulmonologist or PCP regarding stopping Aspirin, Coumadin, Pllavix ,Eliquis, Effient, or Pradaxa, and Pletal.  ____Stop Anti-inflammatories such as Advil, Aleve, Ibuprofen, Motrin, Naproxen, Naprosyn, Goodies powders or aspirin products. OK TO CONTINUE DICLOFENAC-DO NOT TAKE AM OF SURGERY   ____ Stop supplements until after surgery.   _X___ Bring C-Pap to the hospital.

## 2017-07-11 NOTE — Telephone Encounter (Signed)
Please call her and let her know that I have not seen her mother in a while.  Would probably need to see her to complete the form.  She can obtain information and forward to [place], but sounds like she will need appt.

## 2017-07-12 NOTE — Telephone Encounter (Addendum)
Called daughter made her aware would need appointment for paperwork for mother to be completed patient daughter agreed. The patient daughter stated has to be completed this week, patient has not been seen in over a year and neither ha the patient daughter , she this for patient to be admitted to nursing could I schedule with NP for evaluation and I could complete paperwork would that work?

## 2017-07-12 NOTE — Telephone Encounter (Signed)
I can see her Friday during lunch.  Have them come at 12:45 - Friday 07/14/17  .  Will need to bring paper work and correct meds taking ,etc.   Thanks

## 2017-07-12 NOTE — Telephone Encounter (Signed)
Patient daughter is aware and she needs Just and FL2 form , patient is coming with medications to office. Patient name is Jennifer Pratt [birth year] , Jennifer Pratt

## 2017-07-13 ENCOUNTER — Encounter
Admission: RE | Admit: 2017-07-13 | Discharge: 2017-07-13 | Disposition: A | Discharge status: Home / Self Care | Payer: BLUE CROSS/BLUE SHIELD | Source: Ambulatory Visit | Attending: General Surgery | Admitting: General Surgery

## 2017-07-13 DIAGNOSIS — I10 Essential (primary) hypertension: Secondary | ICD-10-CM | POA: Diagnosis not present

## 2017-07-13 DIAGNOSIS — Z01810 Encounter for preprocedural cardiovascular examination: Secondary | ICD-10-CM | POA: Diagnosis not present

## 2017-07-17 ENCOUNTER — Ambulatory Visit: Payer: BLUE CROSS/BLUE SHIELD | Admitting: Certified Registered Nurse Anesthetist

## 2017-07-17 ENCOUNTER — Encounter: Payer: Self-pay | Admitting: *Deleted

## 2017-07-17 ENCOUNTER — Ambulatory Visit
Admission: RE | Admit: 2017-07-17 | Discharge: 2017-07-17 | Disposition: A | Discharge status: Home / Self Care | Payer: BLUE CROSS/BLUE SHIELD | Source: Ambulatory Visit | Attending: General Surgery | Admitting: General Surgery

## 2017-07-17 ENCOUNTER — Encounter
Admission: RE | Disposition: A | Discharge status: Home / Self Care | Payer: Self-pay | Source: Ambulatory Visit | Attending: General Surgery

## 2017-07-17 DIAGNOSIS — D0511 Intraductal carcinoma in situ of right breast: Secondary | ICD-10-CM

## 2017-07-17 DIAGNOSIS — C50511 Malignant neoplasm of lower-outer quadrant of right female breast: Secondary | ICD-10-CM | POA: Diagnosis not present

## 2017-07-17 DIAGNOSIS — I10 Essential (primary) hypertension: Secondary | ICD-10-CM | POA: Diagnosis not present

## 2017-07-17 DIAGNOSIS — E7800 Pure hypercholesterolemia, unspecified: Secondary | ICD-10-CM | POA: Diagnosis not present

## 2017-07-17 DIAGNOSIS — M1990 Unspecified osteoarthritis, unspecified site: Secondary | ICD-10-CM | POA: Diagnosis not present

## 2017-07-17 DIAGNOSIS — Z96652 Presence of left artificial knee joint: Secondary | ICD-10-CM | POA: Diagnosis not present

## 2017-07-17 DIAGNOSIS — Z87891 Personal history of nicotine dependence: Secondary | ICD-10-CM | POA: Insufficient documentation

## 2017-07-17 DIAGNOSIS — Z79899 Other long term (current) drug therapy: Secondary | ICD-10-CM | POA: Insufficient documentation

## 2017-07-17 DIAGNOSIS — E890 Postprocedural hypothyroidism: Secondary | ICD-10-CM | POA: Diagnosis not present

## 2017-07-17 DIAGNOSIS — G4730 Sleep apnea, unspecified: Secondary | ICD-10-CM | POA: Insufficient documentation

## 2017-07-17 DIAGNOSIS — E039 Hypothyroidism, unspecified: Secondary | ICD-10-CM | POA: Diagnosis not present

## 2017-07-17 HISTORY — PX: MASTECTOMY, PARTIAL: SHX709

## 2017-07-17 HISTORY — PX: BREAST LUMPECTOMY: SHX2

## 2017-07-17 HISTORY — PX: BREAST EXCISIONAL BIOPSY: SUR124

## 2017-07-17 SURGERY — MASTECTOMY PARTIAL
Anesthesia: General | Laterality: Right | Wound class: Clean

## 2017-07-17 MED ORDER — DEXAMETHASONE SODIUM PHOSPHATE 10 MG/ML IJ SOLN
INTRAMUSCULAR | Status: AC
  Filled 2017-07-17: qty 1

## 2017-07-17 MED ORDER — ONDANSETRON HCL 4 MG/2ML IJ SOLN
INTRAMUSCULAR | Status: AC
  Filled 2017-07-17: qty 2

## 2017-07-17 MED ORDER — BUPIVACAINE-EPINEPHRINE (PF) 0.5% -1:200000 IJ SOLN
INTRAMUSCULAR | Status: AC
  Filled 2017-07-17: qty 30

## 2017-07-17 MED ORDER — GABAPENTIN 300 MG PO CAPS
300.0000 mg | ORAL_CAPSULE | ORAL | Status: AC
  Administered 2017-07-17: 300 mg via ORAL

## 2017-07-17 MED ORDER — ACETAMINOPHEN 500 MG PO TABS
1000.0000 mg | ORAL_TABLET | ORAL | Status: AC
  Administered 2017-07-17: 1000 mg via ORAL

## 2017-07-17 MED ORDER — DEXAMETHASONE SODIUM PHOSPHATE 10 MG/ML IJ SOLN
INTRAMUSCULAR | Status: DC | PRN
  Administered 2017-07-17: 10 mg via INTRAVENOUS

## 2017-07-17 MED ORDER — GABAPENTIN 300 MG PO CAPS
ORAL_CAPSULE | ORAL | Status: AC
  Administered 2017-07-17: 300 mg via ORAL
  Filled 2017-07-17: qty 1

## 2017-07-17 MED ORDER — KETOROLAC TROMETHAMINE 30 MG/ML IJ SOLN
INTRAMUSCULAR | Status: DC | PRN
  Administered 2017-07-17: 30 mg via INTRAVENOUS

## 2017-07-17 MED ORDER — FENTANYL CITRATE (PF) 100 MCG/2ML IJ SOLN
INTRAMUSCULAR | Status: AC
  Administered 2017-07-17: 25 ug via INTRAVENOUS
  Filled 2017-07-17: qty 2

## 2017-07-17 MED ORDER — FENTANYL CITRATE (PF) 100 MCG/2ML IJ SOLN
INTRAMUSCULAR | Status: AC
  Filled 2017-07-17: qty 2

## 2017-07-17 MED ORDER — FAMOTIDINE 20 MG PO TABS
20.0000 mg | ORAL_TABLET | Freq: Once | ORAL | Status: AC
  Administered 2017-07-17: 20 mg via ORAL

## 2017-07-17 MED ORDER — ONDANSETRON HCL 4 MG/2ML IJ SOLN: 4.0000 mg | Freq: Once | INTRAMUSCULAR | Status: DC | PRN

## 2017-07-17 MED ORDER — LIDOCAINE HCL (PF) 2 % IJ SOLN
INTRAMUSCULAR | Status: AC
  Filled 2017-07-17: qty 2

## 2017-07-17 MED ORDER — MIDAZOLAM HCL 2 MG/2ML IJ SOLN
INTRAMUSCULAR | Status: AC
  Filled 2017-07-17: qty 2

## 2017-07-17 MED ORDER — PROPOFOL 10 MG/ML IV BOLUS
INTRAVENOUS | Status: AC
  Filled 2017-07-17: qty 20

## 2017-07-17 MED ORDER — CELECOXIB 200 MG PO CAPS
400.0000 mg | ORAL_CAPSULE | ORAL | Status: AC
  Administered 2017-07-17: 400 mg via ORAL

## 2017-07-17 MED ORDER — FAMOTIDINE 20 MG PO TABS
ORAL_TABLET | ORAL | Status: AC
  Administered 2017-07-17: 20 mg via ORAL
  Filled 2017-07-17: qty 1

## 2017-07-17 MED ORDER — FENTANYL CITRATE (PF) 100 MCG/2ML IJ SOLN
25.0000 ug | INTRAMUSCULAR | Status: DC | PRN
  Administered 2017-07-17 (×4): 25 ug via INTRAVENOUS

## 2017-07-17 MED ORDER — HYDROCODONE-ACETAMINOPHEN 5-325 MG PO TABS: 1.0000 | ORAL_TABLET | ORAL | 0 refills | Status: DC | PRN

## 2017-07-17 MED ORDER — CELECOXIB 200 MG PO CAPS
ORAL_CAPSULE | ORAL | Status: AC
  Administered 2017-07-17: 400 mg via ORAL
  Filled 2017-07-17: qty 2

## 2017-07-17 MED ORDER — HYDROCODONE-ACETAMINOPHEN 5-325 MG PO TABS
1.0000 | ORAL_TABLET | ORAL | Status: DC | PRN
  Administered 2017-07-17: 1 via ORAL

## 2017-07-17 MED ORDER — MIDAZOLAM HCL 2 MG/2ML IJ SOLN
INTRAMUSCULAR | Status: DC | PRN
  Administered 2017-07-17: 2 mg via INTRAVENOUS

## 2017-07-17 MED ORDER — ACETAMINOPHEN 10 MG/ML IV SOLN
INTRAVENOUS | Status: DC | PRN
  Administered 2017-07-17: 1000 mg via INTRAVENOUS

## 2017-07-17 MED ORDER — FENTANYL CITRATE (PF) 100 MCG/2ML IJ SOLN
INTRAMUSCULAR | Status: DC | PRN
  Administered 2017-07-17: 25 ug via INTRAVENOUS
  Administered 2017-07-17: 50 ug via INTRAVENOUS

## 2017-07-17 MED ORDER — LIDOCAINE HCL (CARDIAC) 20 MG/ML IV SOLN
INTRAVENOUS | Status: DC | PRN
  Administered 2017-07-17: 80 mg via INTRAVENOUS

## 2017-07-17 MED ORDER — BUPIVACAINE-EPINEPHRINE (PF) 0.5% -1:200000 IJ SOLN
INTRAMUSCULAR | Status: DC | PRN
  Administered 2017-07-17: 30 mL via PERINEURAL

## 2017-07-17 MED ORDER — ACETAMINOPHEN 10 MG/ML IV SOLN
INTRAVENOUS | Status: AC
  Filled 2017-07-17: qty 100

## 2017-07-17 MED ORDER — PHENYLEPHRINE HCL 10 MG/ML IJ SOLN
INTRAMUSCULAR | Status: DC | PRN
  Administered 2017-07-17 (×2): 200 ug via INTRAVENOUS
  Administered 2017-07-17: 100 ug via INTRAVENOUS
  Administered 2017-07-17: 200 ug via INTRAVENOUS

## 2017-07-17 MED ORDER — GLYCOPYRROLATE 0.2 MG/ML IJ SOLN
INTRAMUSCULAR | Status: AC
  Filled 2017-07-17: qty 1

## 2017-07-17 MED ORDER — HYDROCODONE-ACETAMINOPHEN 5-325 MG PO TABS
ORAL_TABLET | ORAL | Status: AC
  Filled 2017-07-17: qty 1

## 2017-07-17 MED ORDER — PROPOFOL 10 MG/ML IV BOLUS
INTRAVENOUS | Status: DC | PRN
  Administered 2017-07-17: 100 mg via INTRAVENOUS
  Administered 2017-07-17: 200 mg via INTRAVENOUS

## 2017-07-17 MED ORDER — LACTATED RINGERS IV SOLN
INTRAVENOUS | Status: DC
  Administered 2017-07-17: 08:00:00 via INTRAVENOUS

## 2017-07-17 MED ORDER — ONDANSETRON HCL 4 MG/2ML IJ SOLN
INTRAMUSCULAR | Status: DC | PRN
  Administered 2017-07-17: 4 mg via INTRAVENOUS

## 2017-07-17 MED ORDER — GLYCOPYRROLATE 0.2 MG/ML IJ SOLN
INTRAMUSCULAR | Status: DC | PRN
  Administered 2017-07-17: 0.2 mg via INTRAVENOUS

## 2017-07-17 MED ORDER — ACETAMINOPHEN 500 MG PO TABS
ORAL_TABLET | ORAL | Status: AC
  Administered 2017-07-17: 1000 mg via ORAL
  Filled 2017-07-17: qty 2

## 2017-07-17 SURGICAL SUPPLY — 53 items
BANDAGE ELASTIC 6 LF NS (GAUZE/BANDAGES/DRESSINGS) IMPLANT
BLADE SURG 15 STRL SS SAFETY (BLADE) ×4 IMPLANT
BNDG GAUZE 4.5X4.1 6PLY STRL (MISCELLANEOUS) IMPLANT
BRA SURGICAL LRG (MISCELLANEOUS) ×2 IMPLANT
BULB RESERV EVAC DRAIN JP 100C (MISCELLANEOUS) IMPLANT
CANISTER SUCT 1200ML W/VALVE (MISCELLANEOUS) ×2 IMPLANT
CHLORAPREP W/TINT 26ML (MISCELLANEOUS) ×2 IMPLANT
CNTNR SPEC 2.5X3XGRAD LEK (MISCELLANEOUS)
CONT SPEC 4OZ STER OR WHT (MISCELLANEOUS)
CONTAINER SPEC 2.5X3XGRAD LEK (MISCELLANEOUS) IMPLANT
COVER PROBE FLX POLY STRL (MISCELLANEOUS) ×2 IMPLANT
DEVICE DUBIN SPECIMEN MAMMOGRA (MISCELLANEOUS) ×2 IMPLANT
DRAIN CHANNEL JP 15F RND 16 (MISCELLANEOUS) IMPLANT
DRAPE LAPAROTOMY 100X77 ABD (DRAPES) IMPLANT
DRAPE LAPAROTOMY TRNSV 106X77 (MISCELLANEOUS) ×2 IMPLANT
DRAPE SHEET LG 3/4 BI-LAMINATE (DRAPES) ×2 IMPLANT
DRSG TELFA 3X8 NADH (GAUZE/BANDAGES/DRESSINGS) ×2 IMPLANT
DRSG TELFA 4X3 1S NADH ST (GAUZE/BANDAGES/DRESSINGS) IMPLANT
ELECT CAUTERY BLADE TIP 2.5 (TIP) ×2
ELECT REM PT RETURN 9FT ADLT (ELECTROSURGICAL) ×2
ELECTRODE CAUTERY BLDE TIP 2.5 (TIP) ×1 IMPLANT
ELECTRODE REM PT RTRN 9FT ADLT (ELECTROSURGICAL) ×1 IMPLANT
GAUZE FLUFF 18X24 1PLY STRL (GAUZE/BANDAGES/DRESSINGS) ×2 IMPLANT
GAUZE SPONGE 4X4 12PLY STRL (GAUZE/BANDAGES/DRESSINGS) IMPLANT
GLOVE BIO SURGEON STRL SZ7.5 (GLOVE) ×2 IMPLANT
GLOVE INDICATOR 8.0 STRL GRN (GLOVE) ×2 IMPLANT
GOWN STRL REUS W/ TWL LRG LVL3 (GOWN DISPOSABLE) ×2 IMPLANT
GOWN STRL REUS W/TWL LRG LVL3 (GOWN DISPOSABLE) ×2
KIT RM TURNOVER STRD PROC AR (KITS) ×2 IMPLANT
LABEL OR SOLS (LABEL) ×2 IMPLANT
MARGIN MAP 10MM (MISCELLANEOUS) ×2 IMPLANT
NDL SAFETY 22GX1.5 (NEEDLE) ×2 IMPLANT
NEEDLE HYPO 25X1 1.5 SAFETY (NEEDLE) ×4 IMPLANT
PACK BASIN MINOR ARMC (MISCELLANEOUS) ×2 IMPLANT
SHEARS FOC LG CVD HARMONIC 17C (MISCELLANEOUS) IMPLANT
SHEARS HARMONIC 9CM CVD (BLADE) IMPLANT
STRIP CLOSURE SKIN 1/2X4 (GAUZE/BANDAGES/DRESSINGS) ×2 IMPLANT
SUT ETHILON 3-0 FS-10 30 BLK (SUTURE) ×2
SUT SILK 2 0 (SUTURE) ×1
SUT SILK 2-0 18XBRD TIE 12 (SUTURE) ×1 IMPLANT
SUT VIC AB 2-0 CT1 (SUTURE) ×2 IMPLANT
SUT VIC AB 2-0 CT1 27 (SUTURE) ×1
SUT VIC AB 2-0 CT1 TAPERPNT 27 (SUTURE) ×1 IMPLANT
SUT VIC AB 4-0 FS2 27 (SUTURE) ×2 IMPLANT
SUT VICRYL+ 3-0 144IN (SUTURE) ×2 IMPLANT
SUTURE EHLN 3-0 FS-10 30 BLK (SUTURE) ×1 IMPLANT
SWABSTK COMLB BENZOIN TINCTURE (MISCELLANEOUS) ×2 IMPLANT
SYR BULB IRRIG 60ML STRL (SYRINGE) ×2 IMPLANT
SYR CONTROL 10ML (SYRINGE) ×2 IMPLANT
SYRINGE 10CC LL (SYRINGE) IMPLANT
TAPE TRANSPORE STRL 2 31045 (GAUZE/BANDAGES/DRESSINGS) IMPLANT
TOWEL OR 17X26 4PK STRL BLUE (TOWEL DISPOSABLE) ×2 IMPLANT
WATER STERILE IRR 1000ML POUR (IV SOLUTION) ×2 IMPLANT

## 2017-07-17 NOTE — Transfer of Care (Signed)
Immediate Anesthesia Transfer of Care Note  Patient: Jennifer Pratt  Procedure(s) Performed: Procedure(s): MASTECTOMY PARTIAL (Right)  Patient Location: PACU  Anesthesia Type:General  Level of Consciousness: awake and alert   Airway & Oxygen Therapy: Patient Spontanous Breathing and Patient connected to face mask oxygen  Post-op Assessment: Report given to RN and Post -op Vital signs reviewed and stable  Post vital signs: Reviewed and stable  Last Vitals:  Vitals:   07/17/17 0750  BP: (!) 173/96  Pulse: 91  Resp: 18  Temp: 37 C  SpO2: 97%    Last Pain:  Vitals:   07/17/17 0750  TempSrc: Oral         Complications: No apparent anesthesia complications

## 2017-07-17 NOTE — Anesthesia Postprocedure Evaluation (Signed)
Anesthesia Post Note  Patient: Jennifer Pratt  Procedure(s) Performed: Procedure(s) (LRB): MASTECTOMY PARTIAL (Right)  Patient location during evaluation: PACU Anesthesia Type: General Level of consciousness: awake and alert Pain management: pain level controlled Vital Signs Assessment: post-procedure vital signs reviewed and stable Respiratory status: spontaneous breathing, nonlabored ventilation, respiratory function stable and patient connected to nasal cannula oxygen Cardiovascular status: blood pressure returned to baseline and stable Postop Assessment: no signs of nausea or vomiting Anesthetic complications: no     Last Vitals:  Vitals:   07/17/17 1124 07/17/17 1148  BP: 120/83 (!) 158/88  Pulse: 88 89  Resp: 16 16  Temp: 36.9 C   SpO2: 95% 97%    Last Pain:  Vitals:   07/17/17 1149  TempSrc:   PainSc: [place]

## 2017-07-17 NOTE — Progress Notes (Signed)
Blood pressure 174/102  No new orders from dr Marcello Moores at this time

## 2017-07-17 NOTE — Op Note (Signed)
Preoperative diagnosis: High-grade DCIS lower-outer quadrant of the right breast.  Postoperative diagnosis: Same.  Operative procedure: Wide local excision right breast DCIS with ultrasound guidance.  Operating surgeon: Ollen Bowl, M.D.  Anesthesia: Gen. by LMA, Marcaine 0.5% with 1-200,000 of epinephrine, 30 mL.  Estimated blood loss: Less than 10 mL.  Clinical note: This 56 year old woman was noted to have a new area of calcifications and stereotactic biopsy showed evidence of high-grade DCIS. Post biopsy imaging showed residual calcifications anterior and lateral to the biopsy site. She is a candidate for wide excision.  Operative note: With the patient under adequate general anesthesia and rolled slightly to the left the breast chest and axilla was prepped with ChloraPrep and draped. Ultrasound was used to identify the biopsy cavity. Marcaine was infiltrated for postoperative analgesia. A radial incision was made at the 8:00 position centered 9 cm from the nipple. The skin was incised sharply and the remaining dissection completed with electrocautery. There is a band of inflammatory tissue extending towards the skin and this actually was the entrance site for the biopsy needle this was at the inferior aspect of the specimen towards the medial side. A block of tissue that measured 4 x 5 x 5 cm was removed. Post biopsy imaging showed the clip near the center of the specimen and a band of calcifications adjacent to it. In discussion with the pathologist to his reviewing the specimen there is difficult to tell the biopsy cavity and hematoma from a area of scarring which is now on retrospective review likely the needle entrance site. It was elected not to resect any more tissue pending pathology. The deep tissue was elevated above the level of the fascia circumferentially and approximated with interrupted 2-0 Vicryl sutures. The deep adipose tissue was then elevated off the remaining breast  parenchyma. The parenchyma was approximated in a similar fashion. A deep dermal layer of interrupted 2-0 Vicryl sutures were used to take tension off the skin and the skin finally approximated with a running 4-0 Vicryl subcuticular suture. Benzoin, Steri-Strips and Telfa pad applied. Fluff gauze and a surgical bra was placed.  The patient tolerated the procedure well and was taken recovery room in stable condition.

## 2017-07-17 NOTE — H&P (Signed)
No change in clinical condition or exam. For right breast wide excision.  

## 2017-07-17 NOTE — Anesthesia Procedure Notes (Signed)
Procedure Name: LMA Insertion Date/Time: 07/17/2017 9:00 AM Performed by: Johnna Acosta Pre-anesthesia Checklist: Patient identified, Emergency Drugs available, Suction available, Patient being monitored and Timeout performed Patient Re-evaluated:Patient Re-evaluated prior to induction Oxygen Delivery Method: Circle system utilized Preoxygenation: Pre-oxygenation with 100% oxygen Induction Type: IV induction LMA: LMA inserted LMA Size: 4.0 Number of attempts: 1 Placement Confirmation: positive ETCO2 and breath sounds checked- equal and bilateral Tube secured with: Tape Dental Injury: Teeth and Oropharynx as per pre-operative assessment

## 2017-07-17 NOTE — Anesthesia Preprocedure Evaluation (Addendum)
Anesthesia Evaluation  Patient identified by MRN, date of birth, ID band Patient awake    Reviewed: Allergy & Precautions, NPO status , Patient's Chart, lab work & pertinent test results, reviewed documented beta blocker date and time   Airway Mallampati: III  TM Distance: >3 FB     Dental  (+) Chipped, Missing   Pulmonary sleep apnea , Current Smoker,           Cardiovascular hypertension, Pt. on medications      Neuro/Psych Seizures -,     GI/Hepatic PUD,   Endo/Other  Hypothyroidism   Renal/GU      Musculoskeletal  (+) Arthritis ,   Abdominal   Peds  Hematology   Anesthesia Other Findings One missing tooth. 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 None since. Obese.  Reproductive/Obstetrics                            Anesthesia Physical Anesthesia Plan  ASA: III  Anesthesia Plan: General   Post-op Pain Management:    Induction: Intravenous  PONV Risk Score and Plan:   Airway Management Planned: LMA  Additional Equipment:   Intra-op Plan:   Post-operative Plan:   Informed Consent: I have reviewed the patients History and Physical, chart, labs and discussed the procedure including the risks, benefits and alternatives for the proposed anesthesia with the patient or authorized representative who has indicated his/her understanding and acceptance.     Plan Discussed with: CRNA  Anesthesia Plan Comments:         Anesthesia Quick Evaluation

## 2017-07-17 NOTE — Discharge Instructions (Signed)

## 2017-07-17 NOTE — Progress Notes (Signed)
Dr. Byrnett into see 

## 2017-07-17 NOTE — Anesthesia Post-op Follow-up Note (Signed)
Anesthesia QCDR form completed.        

## 2017-07-18 ENCOUNTER — Telehealth: Payer: Self-pay | Admitting: General Surgery

## 2017-07-18 NOTE — Telephone Encounter (Signed)
The patient was notified that no additional findings besides high-grade DCIS. Margins were clear.  She reports no pain, minimal soreness. Proceeding with plans for her mother's funeral this coming Friday.  Follow-up on August 27 as planned.

## 2017-07-20 LAB — SURGICAL PATHOLOGY

## 2017-07-24 ENCOUNTER — Ambulatory Visit (INDEPENDENT_AMBULATORY_CARE_PROVIDER_SITE_OTHER): Payer: BLUE CROSS/BLUE SHIELD | Admitting: General Surgery

## 2017-07-24 ENCOUNTER — Inpatient Hospital Stay: Payer: Self-pay

## 2017-07-24 ENCOUNTER — Encounter: Payer: Self-pay | Admitting: General Surgery

## 2017-07-24 VITALS — BP 138/84 | HR 91 | Resp 14 | Ht 69.0 in | Wt 231.0 lb

## 2017-07-24 DIAGNOSIS — D0511 Intraductal carcinoma in situ of right breast: Secondary | ICD-10-CM

## 2017-07-24 NOTE — Progress Notes (Signed)
Patient ID: Jennifer Pratt, female   DOB: 1961/04/17, 56 y.o.   MRN: [id number]  Chief Complaint  Patient presents with  . Routine Post Op    HPI Jennifer Pratt is a 56 y.o. female here for a post op right breast lumpectomy done on 07/17/17. She reports that she is doing very well with no pain.   HPI  Past Medical History:  Diagnosis Date  . Arthritis   . 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 Hypercholesterolemia   . Hypertension   . Hypothyroidism   . 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 Sleep apnea    USES CPAP  . Ulcer    Gastric    Past Surgical History:  Procedure Laterality Date  . BREAST BIOPSY Right 06/28/2017   Stereo affirm- UOQ/DUCTAL CARCINOMA IN SITU, HIGH NUCLEAR GRADE WITH COMEDONECROSIS   . BREAST EXCISIONAL BIOPSY Right 07/17/2017   lumpetcomy done by Dr. Bary Castilla  . CESAREAN SECTION    . COLONOSCOPY N/A 10/05/2016   Procedure: COLONOSCOPY;  Surgeon: Manya Silvas, MD;  [facility] ENDOSCOPY;  Service: Endoscopy;  Laterality: N/A;  Zosyn IVPB  . JOINT REPLACEMENT    . MASTECTOMY, PARTIAL Right 07/17/2017   Procedure: MASTECTOMY PARTIAL;  Surgeon: Robert Bellow, MD;  Location: [facility] ORS;  Service: General;  Laterality: Right;  . thumb surgery Right 2005   AND CTR  . THYROIDECTOMY  1988  . TOTAL KNEE ARTHROPLASTY Left 2012    Family History  Problem Relation Age of Onset  . Diabetes Father   . Dementia Mother   . Breast cancer Neg Hx   . Colon cancer Neg Hx     Social History Social History  Substance Use Topics  . Smoking status: Current Some Day Smoker    Packs/day: 0.25    Types: Cigarettes  . Smokeless tobacco: Never Used     Comment: PT ONLY SMOKES SOCIALLY-SHE MAY GO A FEW MONTHS WITHOUT EVER SMOKING   . Alcohol use 0.0 oz/week     Comment: Socially    Allergies  Allergen Reactions  . Lisinopril Other (See Comments)    UNKNOWN      Current Outpatient Prescriptions  Medication Sig Dispense Refill  . carbamazepine (CARBATROL) 200 MG 12 hr capsule TAKE 1 CAPSULE (200 MG TOTAL) BY MOUTH 2 (TWO) TIMES DAILY. 180 capsule 3  . diclofenac (VOLTAREN) 75 MG EC tablet Take 1 tablet (75 mg total) by mouth 2 (two) times daily as needed. (Patient taking differently: Take 75 mg by mouth 2 (two) times daily. ) 60 tablet 0  . DULoxetine (CYMBALTA) 60 MG capsule Take 1 capsule (60 mg total) by mouth daily. (Patient taking differently: Take 60 mg by mouth at bedtime. ) 90 capsule 0  . fluticasone (FLONASE) 50 MCG/ACT nasal spray Place 1 spray into both nostrils daily as needed for allergies or rhinitis.    Marland Kitchen levothyroxine (SYNTHROID, LEVOTHROID) 175 MCG tablet TAKE 1 TABLET BY MOUTH EVERY DAY (Patient taking differently: TAKE 1 TABLET BY MOUTH EVERY DAY-am) 90 tablet 1  . pravastatin (PRAVACHOL) 10 MG tablet TAKE 1 TABLET (10 MG TOTAL) BY MOUTH DAILY. (Patient taking differently: TAKE 1 TABLET (10 MG TOTAL) BY MOUTH DAILY-AM) 90 tablet 1  . valsartan (DIOVAN) 80 MG tablet Take 1 tablet (80 mg total) by mouth daily. PLEASE RESCHEDULE CANCELLED APPT FOR FURTHER REFILLS (Patient taking differently: Take 80 mg  by mouth at bedtime. PLEASE RESCHEDULE CANCELLED APPT FOR FURTHER REFILLS) 90 tablet 1   No current facility-administered medications for this visit.     Review of Systems Review of Systems  Constitutional: Negative.   Respiratory: Negative.   Cardiovascular: Negative.     Blood pressure 138/84, pulse 91, resp. rate 14, height 5\' 9"  (1.753 m), weight 231 lb (104.8 kg), last menstrual period 10/09/2012.  Physical Exam Physical Exam  Constitutional: She is oriented to person, place, and time. She appears well-developed and well-nourished.  Pulmonary/Chest:    Small amount of bruising post operatively   Neurological: She is alert and oriented to person, place, and time.  Skin: Skin is warm and dry.  Psychiatric: She has a  normal mood and affect.    Data Reviewed Ultrasound examination was completed to determine if the patient was a candidate for accelerated partial breast radiation. The biopsy cavity measures 1.3 x 1.7 x 5.1 cm. Minimal distance the overlying skin without compression 1.3 cm. With compression 1.09 cm. Likely good candidate for accelerated partial breast radiation.  Pathology showed a 10 mm area of high-grade DCIS, closest margin 3 mm superior. ER/PR positive.  Assessment    Doing well status post right breast wide excision for high-grade DCIS.    Plan    Arrangements will made for evaluation by radiation oncology. After that assessment is complete and radiation is finished will review options for antiestrogen therapy.    HPI, Physical Exam, Assessment and Plan have been scribed under the direction and in the presence of Robert Bellow, MD  Concepcion Living, LPN  I have completed the exam and reviewed the above documentation for accuracy and completeness.  I agree with the above.  Haematologist has been used and any errors in dictation or transcription are unintentional.  Hervey Ard, M.D., F.A.C.S.   Robert Bellow 07/24/2017, 2:53 PM

## 2017-07-24 NOTE — Patient Instructions (Addendum)
May use Neosporin to open areas. Wear your bra at night for a while.  Referral to Dr Baruch Gouty for radiation oncology

## 2017-07-27 ENCOUNTER — Ambulatory Visit: Payer: BLUE CROSS/BLUE SHIELD | Admitting: Radiation Oncology

## 2017-08-07 ENCOUNTER — Ambulatory Visit
Admission: RE | Admit: 2017-08-07 | Discharge: 2017-08-07 | Disposition: A | Discharge status: Home / Self Care | Payer: BLUE CROSS/BLUE SHIELD | Source: Ambulatory Visit | Attending: Radiation Oncology | Admitting: Radiation Oncology

## 2017-08-07 ENCOUNTER — Encounter: Payer: Self-pay | Admitting: Radiation Oncology

## 2017-08-07 VITALS — BP 147/101 | HR 97 | Temp 98.0°F | Ht 69.0 in | Wt 232.4 lb

## 2017-08-07 DIAGNOSIS — E039 Hypothyroidism, unspecified: Secondary | ICD-10-CM | POA: Insufficient documentation

## 2017-08-07 DIAGNOSIS — E7800 Pure hypercholesterolemia, unspecified: Secondary | ICD-10-CM | POA: Insufficient documentation

## 2017-08-07 DIAGNOSIS — Z510 Encounter for antineoplastic radiation therapy: Secondary | ICD-10-CM | POA: Diagnosis not present

## 2017-08-07 DIAGNOSIS — Z8669 Personal history of other diseases of the nervous system and sense organs: Secondary | ICD-10-CM | POA: Diagnosis not present

## 2017-08-07 DIAGNOSIS — Z8711 Personal history of peptic ulcer disease: Secondary | ICD-10-CM | POA: Diagnosis not present

## 2017-08-07 DIAGNOSIS — M129 Arthropathy, unspecified: Secondary | ICD-10-CM | POA: Insufficient documentation

## 2017-08-07 DIAGNOSIS — F17210 Nicotine dependence, cigarettes, uncomplicated: Secondary | ICD-10-CM | POA: Insufficient documentation

## 2017-08-07 DIAGNOSIS — Z170 Estrogen receptor positive status [ER+]: Secondary | ICD-10-CM | POA: Insufficient documentation

## 2017-08-07 DIAGNOSIS — Z79899 Other long term (current) drug therapy: Secondary | ICD-10-CM | POA: Diagnosis not present

## 2017-08-07 DIAGNOSIS — G4730 Sleep apnea, unspecified: Secondary | ICD-10-CM | POA: Insufficient documentation

## 2017-08-07 DIAGNOSIS — I10 Essential (primary) hypertension: Secondary | ICD-10-CM | POA: Diagnosis not present

## 2017-08-07 DIAGNOSIS — D0511 Intraductal carcinoma in situ of right breast: Secondary | ICD-10-CM | POA: Diagnosis not present

## 2017-08-07 NOTE — Consult Note (Signed)
NEW PATIENT EVALUATION  Name: Jennifer Pratt  MRN: [id number]  Date:   08/07/2017     DOB: 11/07/1961   This 56 y.o. female patient presents to the clinic for initial evaluation of ductal carcinoma in situ of the right breast status post wide local excision stage 0 (Tis N0 M0) ER/PR positive.  REFERRING PHYSICIAN: Einar Pheasant, MD  CHIEF COMPLAINT:  Chief Complaint  Patient presents with  . Breast Cancer    Initial Evaluation    DIAGNOSIS: The encounter diagnosis was Ductal carcinoma in situ (DCIS) of right breast.   PREVIOUS INVESTIGATIONS:  Mammogram ultrasound reviewed Clinical notes reviewed Pathology reports reviewed  HPI: Patient is a 56 year old female who presented with an abnormal mammogram showing indeterminate 1.5 cm group of calcifications within the outer upper right breast. Ultrasound-guided biopsy was recommended and was positive for ductal carcinoma in situ high nuclear grade with comedonecrosis. Tumor was strongly ER/PR positive. She went on to have a wide local excision showing at least 1 cm ductal carcinoma in situ nuclear grade 3 margins negative at 3 mm to the superior margin. She tolerated her surgery well. Postop evaluation of her seroma showed probable adequate's cavity for MammoSite balloon placement. She is seen today for radiation oncology opinion. She is doing well. She specifically denies breast tenderness cough or bone pain.  PLANNED TREATMENT REGIMEN: Probable accelerated partial breast radiation  PAST MEDICAL HISTORY:  has a past medical history of Arthritis; Breast cancer ([place]) (06/28/2017); Hypercholesterolemia; Hypertension; Hypothyroidism; Seizures ([place]); Sleep apnea; and Ulcer.    PAST SURGICAL HISTORY:  Past Surgical History:  Procedure Laterality Date  . BREAST BIOPSY Right 06/28/2017   Stereo affirm- UOQ/DUCTAL CARCINOMA IN SITU, HIGH NUCLEAR GRADE WITH COMEDONECROSIS   . BREAST EXCISIONAL BIOPSY Right 07/17/2017   lumpetcomy done by  Dr. Bary Castilla  . CESAREAN SECTION    . COLONOSCOPY N/A 10/05/2016   Procedure: COLONOSCOPY;  Surgeon: Manya Silvas, MD;  Location: [facility] ENDOSCOPY;  Service: Endoscopy;  Laterality: N/A;  Zosyn IVPB  . JOINT REPLACEMENT    . MASTECTOMY, PARTIAL Right 07/17/2017   Procedure: MASTECTOMY PARTIAL;  Surgeon: Robert Bellow, MD;  Location: [facility] ORS;  Service: General;  Laterality: Right;  . thumb surgery Right 2005   AND CTR  . THYROIDECTOMY  1988  . TOTAL KNEE ARTHROPLASTY Left 2012    FAMILY HISTORY: family history includes Dementia in her mother; Diabetes in her father.  SOCIAL HISTORY:  reports that she has been smoking Cigarettes.  She has been smoking about 0.25 packs per day. She has never used smokeless tobacco. She reports that she drinks alcohol. She reports that she does not use drugs.  ALLERGIES: Lisinopril  MEDICATIONS:  Current Outpatient Prescriptions  Medication Sig Dispense Refill  . carbamazepine (CARBATROL) 200 MG 12 hr capsule TAKE 1 CAPSULE (200 MG TOTAL) BY MOUTH 2 (TWO) TIMES DAILY. 180 capsule 3  . diclofenac (VOLTAREN) 75 MG EC tablet Take 1 tablet (75 mg total) by mouth 2 (two) times daily as needed. (Patient taking differently: Take 75 mg by mouth 2 (two) times daily. ) 60 tablet 0  . DULoxetine (CYMBALTA) 60 MG capsule Take 1 capsule (60 mg total) by mouth daily. (Patient taking differently: Take 60 mg by mouth at bedtime. ) 90 capsule 0  . fluticasone (FLONASE) 50 MCG/ACT nasal spray Place 1 spray into both nostrils daily as needed for allergies or rhinitis.    Marland Kitchen levothyroxine (SYNTHROID, LEVOTHROID) 175 MCG tablet TAKE 1 TABLET BY MOUTH  EVERY DAY (Patient taking differently: TAKE 1 TABLET BY MOUTH EVERY DAY-am) 90 tablet 1  . pravastatin (PRAVACHOL) 10 MG tablet TAKE 1 TABLET (10 MG TOTAL) BY MOUTH DAILY. (Patient taking differently: TAKE 1 TABLET (10 MG TOTAL) BY MOUTH DAILY-AM) 90 tablet 1  . valsartan (DIOVAN) 80 MG tablet Take 1 tablet (80 mg total) by mouth  daily. PLEASE RESCHEDULE CANCELLED APPT FOR FURTHER REFILLS (Patient taking differently: Take 80 mg by mouth at bedtime. PLEASE RESCHEDULE CANCELLED APPT FOR FURTHER REFILLS) 90 tablet 1   No current facility-administered medications for this encounter.     ECOG PERFORMANCE STATUS:  0 - Asymptomatic  REVIEW OF SYSTEMS:  Patient denies any weight loss, fatigue, weakness, fever, chills or night sweats. Patient denies any loss of vision, blurred vision. Patient denies any ringing  of the ears or hearing loss. No irregular heartbeat. Patient denies heart murmur or history of fainting. Patient denies any chest pain or pain radiating to her upper extremities. Patient denies any shortness of breath, difficulty breathing at night, cough or hemoptysis. Patient denies any swelling in the lower legs. Patient denies any nausea vomiting, vomiting of blood, or coffee ground material in the vomitus. Patient denies any stomach pain. Patient states has had normal bowel movements no significant constipation or diarrhea. Patient denies any dysuria, hematuria or significant nocturia. Patient denies any problems walking, swelling in the joints or loss of balance. Patient denies any skin changes, loss of hair or loss of weight. Patient denies any excessive worrying or anxiety or significant depression. Patient denies any problems with insomnia. Patient denies excessive thirst, polyuria, polydipsia. Patient denies any swollen glands, patient denies easy bruising or easy bleeding. Patient denies any recent infections, allergies or URI. Patient "s visual fields have not changed significantly in recent time.    PHYSICAL EXAM: BP (!) 147/101   Pulse 97   Temp 98 F (36.7 C)   Ht 5\' 9"  (1.753 m)   Wt 232 lb 5.8 oz (105.4 kg)   LMP 10/09/2012   BMI 34.31 kg/m  Patient status post wide local excision of the right breast. Incision is healing well. No dominant mass or nodularity is noted in either breast in 2 positions  examined. No axillary or supraclavicular adenopathy is appreciated. Well-developed well-nourished patient in NAD. HEENT reveals PERLA, EOMI, discs not visualized.  Oral cavity is clear. No oral mucosal lesions are identified. Neck is clear without evidence of cervical or supraclavicular adenopathy. Lungs are clear to A&P. Cardiac examination is essentially unremarkable with regular rate and rhythm without murmur rub or thrill. Abdomen is benign with no organomegaly or masses noted. Motor sensory and DTR levels are equal and symmetric in the upper and lower extremities. Cranial nerves II through XII are grossly intact. Proprioception is intact. No peripheral adenopathy or edema is identified. No motor or sensory levels are noted. Crude visual fields are within normal range.  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 I've got over both whole breast radiation as well as accelerated partial breast radiation. Patient does favor partial breast irradiation. Would plan on delivering 3400 cGy in 10 fractions at 340 cGy twice a day. We'll coordinate with surgeon's office to place MammoSite balloon catheter and follow-up with CT simulation and brachytherapy treatment planning.  Risks and benefits of both types of treatment including skin reaction fatigue alteration of blood counts possible small area of skin reaction over the balloon catheter site and persistent thickening of the lumpectomy site after high dose rate remote afterloading all were discussed in detail with the patient. She seems to compress my treatment plan well. Patient also will benefit from anti-estrogen therapy after completion of radiation. We will coordinate her care with surgeon's  office.  I would like to take this opportunity to thank you for allowing me to participate in the care of your patient.Armstead Peaks., MD

## 2017-08-09 DIAGNOSIS — G4730 Sleep apnea, unspecified: Secondary | ICD-10-CM | POA: Diagnosis not present

## 2017-08-09 DIAGNOSIS — Z170 Estrogen receptor positive status [ER+]: Secondary | ICD-10-CM | POA: Diagnosis not present

## 2017-08-09 DIAGNOSIS — Z510 Encounter for antineoplastic radiation therapy: Secondary | ICD-10-CM | POA: Diagnosis not present

## 2017-08-09 DIAGNOSIS — Z8669 Personal history of other diseases of the nervous system and sense organs: Secondary | ICD-10-CM | POA: Diagnosis not present

## 2017-08-09 DIAGNOSIS — F17210 Nicotine dependence, cigarettes, uncomplicated: Secondary | ICD-10-CM | POA: Diagnosis not present

## 2017-08-09 DIAGNOSIS — I10 Essential (primary) hypertension: Secondary | ICD-10-CM | POA: Diagnosis not present

## 2017-08-09 DIAGNOSIS — E039 Hypothyroidism, unspecified: Secondary | ICD-10-CM | POA: Diagnosis not present

## 2017-08-09 DIAGNOSIS — D0511 Intraductal carcinoma in situ of right breast: Secondary | ICD-10-CM | POA: Diagnosis not present

## 2017-08-09 DIAGNOSIS — M129 Arthropathy, unspecified: Secondary | ICD-10-CM | POA: Diagnosis not present

## 2017-08-09 DIAGNOSIS — Z79899 Other long term (current) drug therapy: Secondary | ICD-10-CM | POA: Diagnosis not present

## 2017-08-09 DIAGNOSIS — E7800 Pure hypercholesterolemia, unspecified: Secondary | ICD-10-CM | POA: Diagnosis not present

## 2017-08-09 DIAGNOSIS — Z8711 Personal history of peptic ulcer disease: Secondary | ICD-10-CM | POA: Diagnosis not present

## 2017-08-10 ENCOUNTER — Telehealth: Payer: Self-pay | Admitting: *Deleted

## 2017-08-10 MED ORDER — CEFADROXIL 500 MG PO CAPS: 500.0000 mg | ORAL_CAPSULE | Freq: Two times a day (BID) | ORAL | 0 refills | Status: DC

## 2017-08-10 NOTE — Telephone Encounter (Signed)
Mammosite schedule reviewed with the patient Placement  08-16-17  at [facility] at 8:15  Scan 08-17-17 Treat 21, 25-28 Aware the Golden will be calling her for more details Aware of ATB and directions reviewed. Aware no showers and to wear her bra while mammosite in place. Pt agrees.

## 2017-08-16 ENCOUNTER — Encounter: Payer: Self-pay | Admitting: General Surgery

## 2017-08-16 ENCOUNTER — Ambulatory Visit (INDEPENDENT_AMBULATORY_CARE_PROVIDER_SITE_OTHER): Payer: BLUE CROSS/BLUE SHIELD | Admitting: General Surgery

## 2017-08-16 ENCOUNTER — Inpatient Hospital Stay: Payer: Self-pay

## 2017-08-16 VITALS — BP 132/74 | HR 102 | Resp 14 | Ht 69.0 in | Wt 234.0 lb

## 2017-08-16 DIAGNOSIS — D0511 Intraductal carcinoma in situ of right breast: Secondary | ICD-10-CM

## 2017-08-16 NOTE — Progress Notes (Signed)
Patient ID: Jennifer Pratt, female   DOB: [birth year], 56 y.o.   MRN: [id number]  Chief Complaint  Patient presents with  . Procedure    right mammosite    HPI Jennifer Pratt is a 56 y.o. female. The patient has met with radiation oncology and is amenable to accelerated partial  Here for right breast mammosite. The patient did make use of Duricef as requested.  HPI  Past Medical History:  Diagnosis Date  . Arthritis   . 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 Hypercholesterolemia   . Hypertension   . Hypothyroidism   . 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 Sleep apnea    USES CPAP  . Ulcer    Gastric    Past Surgical History:  Procedure Laterality Date  . BREAST BIOPSY Right 06/28/2017   Stereo affirm- UOQ/DUCTAL CARCINOMA IN SITU, HIGH NUCLEAR GRADE WITH COMEDONECROSIS   . BREAST EXCISIONAL BIOPSY Right 07/17/2017   lumpetcomy done by Dr. Bary Castilla  . CESAREAN SECTION    . COLONOSCOPY N/A 10/05/2016   Procedure: COLONOSCOPY;  Surgeon: Manya Silvas, MD;  Location: [facility] ENDOSCOPY;  Service: Endoscopy;  Laterality: N/A;  Zosyn IVPB  . JOINT REPLACEMENT    . MASTECTOMY, PARTIAL Right 07/17/2017   Procedure: MASTECTOMY PARTIAL;  Surgeon: Robert Bellow, MD;  Location: [facility] ORS;  Service: General;  Laterality: Right;  . thumb surgery Right 2005   AND CTR  . THYROIDECTOMY  1988  . TOTAL KNEE ARTHROPLASTY Left 2012    Family History  Problem Relation Age of Onset  . Diabetes Father   . Dementia Mother   . Breast cancer Neg Hx   . Colon cancer Neg Hx     Social History Social History  Substance Use Topics  . Smoking status: Current Some Day Smoker    Packs/day: 0.25    Types: Cigarettes  . Smokeless tobacco: Never Used     Comment: PT ONLY SMOKES SOCIALLY-SHE MAY GO A FEW MONTHS WITHOUT EVER SMOKING   . Alcohol use 0.0 oz/week     Comment: Socially    Allergies   Allergen Reactions  . Lisinopril Other (See Comments)    UNKNOWN     Current Outpatient Prescriptions  Medication Sig Dispense Refill  . carbamazepine (CARBATROL) 200 MG 12 hr capsule TAKE 1 CAPSULE (200 MG TOTAL) BY MOUTH 2 (TWO) TIMES DAILY. 180 capsule 3  . cefadroxil (DURICEF) 500 MG capsule Take 1 capsule (500 mg total) by mouth 2 (two) times daily. Start one hour before procedure on Wednesday 08-16-17 20 capsule 0  . diclofenac (VOLTAREN) 75 MG EC tablet Take 1 tablet (75 mg total) by mouth 2 (two) times daily as needed. (Patient taking differently: Take 75 mg by mouth 2 (two) times daily. ) 60 tablet 0  . DULoxetine (CYMBALTA) 60 MG capsule Take 1 capsule (60 mg total) by mouth daily. (Patient taking differently: Take 60 mg by mouth at bedtime. ) 90 capsule 0  . fluticasone (FLONASE) 50 MCG/ACT nasal spray Place 1 spray into both nostrils daily as needed for allergies or rhinitis.    Marland Kitchen levothyroxine (SYNTHROID, LEVOTHROID) 175 MCG tablet TAKE 1 TABLET BY MOUTH EVERY DAY (Patient taking differently: TAKE 1 TABLET BY MOUTH EVERY DAY-am) 90 tablet 1  . pravastatin (PRAVACHOL) 10 MG tablet TAKE 1 TABLET (10 MG TOTAL) BY MOUTH DAILY. (Patient taking differently: TAKE 1 TABLET (  10 MG TOTAL) BY MOUTH DAILY-AM) 90 tablet 1  . valsartan (DIOVAN) 80 MG tablet Take 1 tablet (80 mg total) by mouth daily. PLEASE RESCHEDULE CANCELLED APPT FOR FURTHER REFILLS (Patient taking differently: Take 80 mg by mouth at bedtime. PLEASE RESCHEDULE CANCELLED APPT FOR FURTHER REFILLS) 90 tablet 1   No current facility-administered medications for this visit.     Review of Systems Review of Systems  Constitutional: Negative.   Respiratory: Negative.   Cardiovascular: Negative.     Blood pressure 132/74, pulse (!) 102, resp. rate 14, height '5\' 9"'  (1.753 m), weight 234 lb (106.1 kg), last menstrual period 10/09/2012.  Physical Exam Physical Exam  Pulmonary/Chest:      Data Reviewed Ultrasound  examination showed a minimal distance of 1.16 from the seroma to the overlying skin. The procedure was reviewed with the patient and she was amenable to proceed. ChloraPrep was applied to the skin followed by 10 mL of 0.5% Xylocaine with 0.25% Marcaine with 1-200,000 units of epinephrine. ChloraPrep was again applied and the area draped. The skin was incised sharply and the 8 mm trocar advanced under ultrasound guidance into the wide excision cavity. About 15-20 mL of old seroma drained without incident. The cavity evaluation device was then advanced under ultrasound guidance. This was inflated with 35 mL of saline after confirming a spherical balloon. At this volume there was a spacing of 0.95 cm between the surface of the balloon and the overlying skin. This was felt adequate for planned accelerated partial breast radiation. The treatment balloon was then placed and inflated with 35 mL of mixture of saline and Omnipaque. At the 35 mL volume the minimal spacing was 0.87 cm. A spherical balloon inflation was noted both prior to placement (was 70 mL volume) and after placement.  Bacitracin was applied to the balloon exit site followed by a bulky gauze dressing.  The patient tolerated the procedure well.  Assessment    Successful placement of MammoSite balloon.    Plan    The patient will present tomorrow for planned imaging post-balloon placement. We'll arrange for office follow-up in 2 weeks.     HPI, Physical Exam, Assessment and Plan have been scribed under the direction and in the presence of Robert Bellow, MD. Karie Fetch, RN  I have completed the exam and reviewed the above documentation for accuracy and completeness.  I agree with the above.  Haematologist has been used and any errors in dictation or transcription are unintentional.  Hervey Ard, M.D., F.A.C.S.  Robert Bellow 08/16/2017, 4:53 PM

## 2017-08-16 NOTE — Patient Instructions (Signed)
The patient is aware to call back for any questions or concerns.  

## 2017-08-17 ENCOUNTER — Ambulatory Visit
Admission: RE | Admit: 2017-08-17 | Discharge: 2017-08-17 | Disposition: A | Discharge status: Home / Self Care | Payer: BLUE CROSS/BLUE SHIELD | Source: Ambulatory Visit | Attending: Radiation Oncology | Admitting: Radiation Oncology

## 2017-08-17 DIAGNOSIS — Z170 Estrogen receptor positive status [ER+]: Secondary | ICD-10-CM | POA: Diagnosis not present

## 2017-08-17 DIAGNOSIS — G4730 Sleep apnea, unspecified: Secondary | ICD-10-CM | POA: Diagnosis not present

## 2017-08-17 DIAGNOSIS — I10 Essential (primary) hypertension: Secondary | ICD-10-CM | POA: Diagnosis not present

## 2017-08-17 DIAGNOSIS — M129 Arthropathy, unspecified: Secondary | ICD-10-CM | POA: Diagnosis not present

## 2017-08-17 DIAGNOSIS — Z8711 Personal history of peptic ulcer disease: Secondary | ICD-10-CM | POA: Diagnosis not present

## 2017-08-17 DIAGNOSIS — F17210 Nicotine dependence, cigarettes, uncomplicated: Secondary | ICD-10-CM | POA: Diagnosis not present

## 2017-08-17 DIAGNOSIS — E7800 Pure hypercholesterolemia, unspecified: Secondary | ICD-10-CM | POA: Diagnosis not present

## 2017-08-17 DIAGNOSIS — Z79899 Other long term (current) drug therapy: Secondary | ICD-10-CM | POA: Diagnosis not present

## 2017-08-17 DIAGNOSIS — D0511 Intraductal carcinoma in situ of right breast: Secondary | ICD-10-CM | POA: Diagnosis not present

## 2017-08-17 DIAGNOSIS — E039 Hypothyroidism, unspecified: Secondary | ICD-10-CM | POA: Diagnosis not present

## 2017-08-17 DIAGNOSIS — Z8669 Personal history of other diseases of the nervous system and sense organs: Secondary | ICD-10-CM | POA: Diagnosis not present

## 2017-08-17 DIAGNOSIS — Z510 Encounter for antineoplastic radiation therapy: Secondary | ICD-10-CM | POA: Diagnosis not present

## 2017-08-18 ENCOUNTER — Ambulatory Visit
Admission: RE | Admit: 2017-08-18 | Discharge: 2017-08-18 | Disposition: A | Discharge status: Home / Self Care | Payer: BLUE CROSS/BLUE SHIELD | Source: Ambulatory Visit | Attending: Radiation Oncology | Admitting: Radiation Oncology

## 2017-08-18 DIAGNOSIS — I10 Essential (primary) hypertension: Secondary | ICD-10-CM | POA: Diagnosis not present

## 2017-08-18 DIAGNOSIS — M129 Arthropathy, unspecified: Secondary | ICD-10-CM | POA: Diagnosis not present

## 2017-08-18 DIAGNOSIS — Z79899 Other long term (current) drug therapy: Secondary | ICD-10-CM | POA: Diagnosis not present

## 2017-08-18 DIAGNOSIS — F17210 Nicotine dependence, cigarettes, uncomplicated: Secondary | ICD-10-CM | POA: Diagnosis not present

## 2017-08-18 DIAGNOSIS — E7800 Pure hypercholesterolemia, unspecified: Secondary | ICD-10-CM | POA: Diagnosis not present

## 2017-08-18 DIAGNOSIS — Z510 Encounter for antineoplastic radiation therapy: Secondary | ICD-10-CM | POA: Diagnosis not present

## 2017-08-18 DIAGNOSIS — Z8669 Personal history of other diseases of the nervous system and sense organs: Secondary | ICD-10-CM | POA: Diagnosis not present

## 2017-08-18 DIAGNOSIS — D0511 Intraductal carcinoma in situ of right breast: Secondary | ICD-10-CM | POA: Diagnosis not present

## 2017-08-18 DIAGNOSIS — Z170 Estrogen receptor positive status [ER+]: Secondary | ICD-10-CM | POA: Diagnosis not present

## 2017-08-18 DIAGNOSIS — E039 Hypothyroidism, unspecified: Secondary | ICD-10-CM | POA: Diagnosis not present

## 2017-08-18 DIAGNOSIS — Z8711 Personal history of peptic ulcer disease: Secondary | ICD-10-CM | POA: Diagnosis not present

## 2017-08-18 DIAGNOSIS — G4730 Sleep apnea, unspecified: Secondary | ICD-10-CM | POA: Diagnosis not present

## 2017-08-22 ENCOUNTER — Ambulatory Visit
Admission: RE | Admit: 2017-08-22 | Discharge: 2017-08-22 | Disposition: A | Discharge status: Home / Self Care | Payer: BLUE CROSS/BLUE SHIELD | Source: Ambulatory Visit | Attending: Radiation Oncology | Admitting: Radiation Oncology

## 2017-08-22 ENCOUNTER — Other Ambulatory Visit: Payer: Self-pay | Admitting: Internal Medicine

## 2017-08-22 DIAGNOSIS — D0511 Intraductal carcinoma in situ of right breast: Secondary | ICD-10-CM | POA: Diagnosis not present

## 2017-08-22 DIAGNOSIS — F17210 Nicotine dependence, cigarettes, uncomplicated: Secondary | ICD-10-CM | POA: Diagnosis not present

## 2017-08-22 DIAGNOSIS — E039 Hypothyroidism, unspecified: Secondary | ICD-10-CM | POA: Diagnosis not present

## 2017-08-22 DIAGNOSIS — G4730 Sleep apnea, unspecified: Secondary | ICD-10-CM | POA: Diagnosis not present

## 2017-08-22 DIAGNOSIS — Z8669 Personal history of other diseases of the nervous system and sense organs: Secondary | ICD-10-CM | POA: Diagnosis not present

## 2017-08-22 DIAGNOSIS — Z79899 Other long term (current) drug therapy: Secondary | ICD-10-CM | POA: Diagnosis not present

## 2017-08-22 DIAGNOSIS — I10 Essential (primary) hypertension: Secondary | ICD-10-CM | POA: Diagnosis not present

## 2017-08-22 DIAGNOSIS — Z510 Encounter for antineoplastic radiation therapy: Secondary | ICD-10-CM | POA: Diagnosis not present

## 2017-08-22 DIAGNOSIS — E7800 Pure hypercholesterolemia, unspecified: Secondary | ICD-10-CM | POA: Diagnosis not present

## 2017-08-22 DIAGNOSIS — Z8711 Personal history of peptic ulcer disease: Secondary | ICD-10-CM | POA: Diagnosis not present

## 2017-08-22 DIAGNOSIS — M129 Arthropathy, unspecified: Secondary | ICD-10-CM | POA: Diagnosis not present

## 2017-08-22 DIAGNOSIS — Z170 Estrogen receptor positive status [ER+]: Secondary | ICD-10-CM | POA: Diagnosis not present

## 2017-08-23 ENCOUNTER — Other Ambulatory Visit: Payer: Self-pay | Admitting: *Deleted

## 2017-08-23 ENCOUNTER — Ambulatory Visit
Admission: RE | Admit: 2017-08-23 | Discharge: 2017-08-23 | Disposition: A | Discharge status: Home / Self Care | Payer: BLUE CROSS/BLUE SHIELD | Source: Ambulatory Visit | Attending: Radiation Oncology | Admitting: Radiation Oncology

## 2017-08-23 DIAGNOSIS — F17210 Nicotine dependence, cigarettes, uncomplicated: Secondary | ICD-10-CM | POA: Diagnosis not present

## 2017-08-23 DIAGNOSIS — Z79899 Other long term (current) drug therapy: Secondary | ICD-10-CM | POA: Diagnosis not present

## 2017-08-23 DIAGNOSIS — D0511 Intraductal carcinoma in situ of right breast: Secondary | ICD-10-CM | POA: Diagnosis not present

## 2017-08-23 DIAGNOSIS — Z170 Estrogen receptor positive status [ER+]: Secondary | ICD-10-CM | POA: Diagnosis not present

## 2017-08-23 DIAGNOSIS — E7800 Pure hypercholesterolemia, unspecified: Secondary | ICD-10-CM | POA: Diagnosis not present

## 2017-08-23 DIAGNOSIS — I10 Essential (primary) hypertension: Secondary | ICD-10-CM | POA: Diagnosis not present

## 2017-08-23 DIAGNOSIS — Z8711 Personal history of peptic ulcer disease: Secondary | ICD-10-CM | POA: Diagnosis not present

## 2017-08-23 DIAGNOSIS — M129 Arthropathy, unspecified: Secondary | ICD-10-CM | POA: Diagnosis not present

## 2017-08-23 DIAGNOSIS — Z510 Encounter for antineoplastic radiation therapy: Secondary | ICD-10-CM | POA: Diagnosis not present

## 2017-08-23 DIAGNOSIS — E039 Hypothyroidism, unspecified: Secondary | ICD-10-CM | POA: Diagnosis not present

## 2017-08-23 DIAGNOSIS — Z8669 Personal history of other diseases of the nervous system and sense organs: Secondary | ICD-10-CM | POA: Diagnosis not present

## 2017-08-23 DIAGNOSIS — G4730 Sleep apnea, unspecified: Secondary | ICD-10-CM | POA: Diagnosis not present

## 2017-08-24 ENCOUNTER — Ambulatory Visit
Admission: RE | Admit: 2017-08-24 | Discharge: 2017-08-24 | Disposition: A | Discharge status: Home / Self Care | Payer: BLUE CROSS/BLUE SHIELD | Source: Ambulatory Visit | Attending: Radiation Oncology | Admitting: Radiation Oncology

## 2017-08-24 DIAGNOSIS — M129 Arthropathy, unspecified: Secondary | ICD-10-CM | POA: Diagnosis not present

## 2017-08-24 DIAGNOSIS — I10 Essential (primary) hypertension: Secondary | ICD-10-CM | POA: Diagnosis not present

## 2017-08-24 DIAGNOSIS — Z510 Encounter for antineoplastic radiation therapy: Secondary | ICD-10-CM | POA: Diagnosis not present

## 2017-08-24 DIAGNOSIS — Z8711 Personal history of peptic ulcer disease: Secondary | ICD-10-CM | POA: Diagnosis not present

## 2017-08-24 DIAGNOSIS — E039 Hypothyroidism, unspecified: Secondary | ICD-10-CM | POA: Diagnosis not present

## 2017-08-24 DIAGNOSIS — G4730 Sleep apnea, unspecified: Secondary | ICD-10-CM | POA: Diagnosis not present

## 2017-08-24 DIAGNOSIS — Z170 Estrogen receptor positive status [ER+]: Secondary | ICD-10-CM | POA: Diagnosis not present

## 2017-08-24 DIAGNOSIS — F17210 Nicotine dependence, cigarettes, uncomplicated: Secondary | ICD-10-CM | POA: Diagnosis not present

## 2017-08-24 DIAGNOSIS — Z79899 Other long term (current) drug therapy: Secondary | ICD-10-CM | POA: Diagnosis not present

## 2017-08-24 DIAGNOSIS — D0511 Intraductal carcinoma in situ of right breast: Secondary | ICD-10-CM | POA: Diagnosis not present

## 2017-08-24 DIAGNOSIS — E7800 Pure hypercholesterolemia, unspecified: Secondary | ICD-10-CM | POA: Diagnosis not present

## 2017-08-24 DIAGNOSIS — Z8669 Personal history of other diseases of the nervous system and sense organs: Secondary | ICD-10-CM | POA: Diagnosis not present

## 2017-08-25 ENCOUNTER — Ambulatory Visit
Admission: RE | Admit: 2017-08-25 | Discharge: 2017-08-25 | Disposition: A | Discharge status: Home / Self Care | Payer: BLUE CROSS/BLUE SHIELD | Source: Ambulatory Visit | Attending: Radiation Oncology | Admitting: Radiation Oncology

## 2017-08-25 DIAGNOSIS — Z170 Estrogen receptor positive status [ER+]: Secondary | ICD-10-CM | POA: Diagnosis not present

## 2017-08-25 DIAGNOSIS — Z79899 Other long term (current) drug therapy: Secondary | ICD-10-CM | POA: Diagnosis not present

## 2017-08-25 DIAGNOSIS — G4730 Sleep apnea, unspecified: Secondary | ICD-10-CM | POA: Diagnosis not present

## 2017-08-25 DIAGNOSIS — E039 Hypothyroidism, unspecified: Secondary | ICD-10-CM | POA: Diagnosis not present

## 2017-08-25 DIAGNOSIS — E7800 Pure hypercholesterolemia, unspecified: Secondary | ICD-10-CM | POA: Diagnosis not present

## 2017-08-25 DIAGNOSIS — Z8711 Personal history of peptic ulcer disease: Secondary | ICD-10-CM | POA: Diagnosis not present

## 2017-08-25 DIAGNOSIS — Z8669 Personal history of other diseases of the nervous system and sense organs: Secondary | ICD-10-CM | POA: Diagnosis not present

## 2017-08-25 DIAGNOSIS — I10 Essential (primary) hypertension: Secondary | ICD-10-CM | POA: Diagnosis not present

## 2017-08-25 DIAGNOSIS — Z510 Encounter for antineoplastic radiation therapy: Secondary | ICD-10-CM | POA: Diagnosis not present

## 2017-08-25 DIAGNOSIS — F17210 Nicotine dependence, cigarettes, uncomplicated: Secondary | ICD-10-CM | POA: Diagnosis not present

## 2017-08-25 DIAGNOSIS — D0511 Intraductal carcinoma in situ of right breast: Secondary | ICD-10-CM | POA: Diagnosis not present

## 2017-08-25 DIAGNOSIS — M129 Arthropathy, unspecified: Secondary | ICD-10-CM | POA: Diagnosis not present

## 2017-09-05 ENCOUNTER — Ambulatory Visit (INDEPENDENT_AMBULATORY_CARE_PROVIDER_SITE_OTHER): Payer: BLUE CROSS/BLUE SHIELD | Admitting: General Surgery

## 2017-09-05 ENCOUNTER — Encounter: Payer: Self-pay | Admitting: General Surgery

## 2017-09-05 VITALS — BP 144/82 | HR 88 | Resp 14 | Ht 69.0 in | Wt 234.0 lb

## 2017-09-05 DIAGNOSIS — D0511 Intraductal carcinoma in situ of right breast: Secondary | ICD-10-CM

## 2017-09-05 MED ORDER — RALOXIFENE HCL 60 MG PO TABS: 60.0000 mg | ORAL_TABLET | Freq: Every day | ORAL | 12 refills | Status: DC

## 2017-09-05 NOTE — Progress Notes (Signed)
Patient ID: Jennifer Pratt, female   DOB: 09/06/1961, 56 y.o.   MRN: [id number]  Chief Complaint  Patient presents with  . Follow-up    HPI Jennifer Pratt is a 56 y.o. female here today for her follow up mammosite placement done 08/16/2017. Removed on 08/25/2017. Patient states she is doing well.    Marland KitchenHPI  Past Medical History:  Diagnosis Date  . Arthritis   . 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  . Hypercholesterolemia   . Hypertension   . Hypothyroidism   . 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 Sleep apnea    USES CPAP  . Ulcer    Gastric    Past Surgical History:  Procedure Laterality Date  . BREAST BIOPSY Right 06/28/2017   Stereo affirm- UOQ/DUCTAL CARCINOMA IN SITU, HIGH NUCLEAR GRADE WITH COMEDONECROSIS   . BREAST EXCISIONAL BIOPSY Right 07/17/2017   WIde excision upper outer quadrant DCIS  Dr. Bary Castilla  . CESAREAN SECTION    . COLONOSCOPY N/A 10/05/2016   Procedure: COLONOSCOPY;  Surgeon: Manya Silvas, MD;  Location: [facility] ENDOSCOPY;  Service: Endoscopy;  Laterality: N/A;  Zosyn IVPB  . JOINT REPLACEMENT    . MASTECTOMY, PARTIAL Right 07/17/2017   Procedure: MASTECTOMY PARTIAL;  Surgeon: Robert Bellow, MD;  Location: [facility] ORS;  Service: General;  Laterality: Right;  . thumb surgery Right 2005   AND CTR  . THYROIDECTOMY  1988  . TOTAL KNEE ARTHROPLASTY Left 2012    Family History  Problem Relation Age of Onset  . Diabetes Father   . Dementia Mother   . Breast cancer Neg Hx   . Colon cancer Neg Hx     Social History Social History  Substance Use Topics  . Smoking status: Current Some Day Smoker    Packs/day: 0.25    Types: Cigarettes  . Smokeless tobacco: Never Used     Comment: PT ONLY SMOKES SOCIALLY-SHE MAY GO A FEW MONTHS WITHOUT EVER SMOKING   . Alcohol use 0.0 oz/week     Comment: Socially    Allergies  Allergen Reactions  . Lisinopril Other (See Comments)    UNKNOWN      Current Outpatient Prescriptions  Medication Sig Dispense Refill  . carbamazepine (CARBATROL) 200 MG 12 hr capsule TAKE 1 CAPSULE (200 MG TOTAL) BY MOUTH 2 (TWO) TIMES DAILY. 180 capsule 3  . diclofenac (VOLTAREN) 75 MG EC tablet Take 1 tablet (75 mg total) by mouth 2 (two) times daily as needed. (Patient taking differently: Take 75 mg by mouth 2 (two) times daily. ) 60 tablet 0  . DULoxetine (CYMBALTA) 60 MG capsule TAKE 1 CAPSULE BY MOUTH EVERY DAY 90 capsule 0  . fluticasone (FLONASE) 50 MCG/ACT nasal spray Place 1 spray into both nostrils daily as needed for allergies or rhinitis.    Marland Kitchen levothyroxine (SYNTHROID, LEVOTHROID) 175 MCG tablet TAKE 1 TABLET BY MOUTH EVERY DAY (Patient taking differently: TAKE 1 TABLET BY MOUTH EVERY DAY-am) 90 tablet 1  . pravastatin (PRAVACHOL) 10 MG tablet TAKE 1 TABLET (10 MG TOTAL) BY MOUTH DAILY. (Patient taking differently: TAKE 1 TABLET (10 MG TOTAL) BY MOUTH DAILY-AM) 90 tablet 1  . valsartan (DIOVAN) 80 MG tablet Take 1 tablet (80 mg total) by mouth daily. PLEASE RESCHEDULE CANCELLED APPT FOR FURTHER REFILLS (Patient taking differently: Take 80 mg by mouth at bedtime. PLEASE RESCHEDULE CANCELLED APPT FOR FURTHER REFILLS) 90 tablet  1  . raloxifene (EVISTA) 60 MG tablet Take 1 tablet (60 mg total) by mouth daily. 30 tablet 12   No current facility-administered medications for this visit.     Review of Systems Review of Systems  Constitutional: Negative.   Respiratory: Negative.   Cardiovascular: Negative.     Blood pressure (!) 144/82, pulse 88, resp. rate 14, height 5\' 9"  (1.753 m), weight 234 lb (106.1 kg), last menstrual period 10/09/2012.  Physical Exam Physical Exam  Constitutional: She is oriented to person, place, and time. She appears well-developed and well-nourished.  Pulmonary/Chest:       Neurological: She is alert and oriented to person, place, and time.  Skin: Skin is warm and dry.    Data Reviewed ER/PR positive  tumor.  Assessment    Doing well status post wide excision and accelerated partial breast radiation.    Plan    Indications for antiestrogen therapy reviewed. Opportunity to meet with medical oncology for formal assessment reviewed. Declined at this time.  Considering that she is making use of Cymbalta with excellent relief for some of her joint pain, it seems reasonable to make use of Evista rather than having her choose a new SSRI. The choice of aromatase inhibitor would probably poorly tolerated with her present arthralgias.  Discussed Evista with patient, she agrees. Return in one month.   The patient is aware to call back for any questions or concerns.    HPI, Physical Exam, Assessment and Plan have been scribed under the direction and in the presence of Hervey Ard, MD.  Gaspar Cola, CMA  I have completed the exam and reviewed the above documentation for accuracy and completeness.  I agree with the above.  Haematologist has been used and any errors in dictation or transcription are unintentional.  Hervey Ard, M.D., F.A.C.S.  Robert Bellow 09/05/2017, 11:37 AM

## 2017-09-05 NOTE — Patient Instructions (Addendum)
Patient to return one month.

## 2017-09-07 ENCOUNTER — Other Ambulatory Visit: Payer: Self-pay

## 2017-09-07 MED ORDER — DULOXETINE HCL 60 MG PO CPEP: ORAL_CAPSULE | ORAL | 1 refills | Status: DC

## 2017-09-25 ENCOUNTER — Encounter: Payer: Self-pay | Admitting: Radiation Oncology

## 2017-09-25 ENCOUNTER — Ambulatory Visit
Admission: RE | Admit: 2017-09-25 | Discharge: 2017-09-25 | Disposition: A | Discharge status: Home / Self Care | Payer: BLUE CROSS/BLUE SHIELD | Source: Ambulatory Visit | Attending: Radiation Oncology | Admitting: Radiation Oncology

## 2017-09-25 VITALS — BP 162/97 | HR 89 | Temp 99.1°F | Resp 20 | Wt 234.1 lb

## 2017-09-25 DIAGNOSIS — D0511 Intraductal carcinoma in situ of right breast: Secondary | ICD-10-CM

## 2017-09-25 NOTE — Progress Notes (Signed)
Radiation Oncology Follow up Note  Name: Jennifer Pratt   Date:   09/25/2017 MRN:  [id number] DOB: 10/13/1961    This 56 y.o. female presents to the clinic today for one-month follow-up status post accelerated partial breast radiation to her right breast for ductal carcinoma in situ.  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 Seen today in routine follow-up she is doing well. She specifically denies breast tenderness cough or bone pain patient has been referred to medical oncology for possible antiestrogen therapy.  COMPLICATIONS OF TREATMENT: none  FOLLOW UP COMPLIANCE: keeps appointments   PHYSICAL EXAM:  BP (!) 162/97   Pulse 89   Temp 99.1 F (37.3 C)   Resp 20   Wt 234 lb 2.1 oz (106.2 kg)   LMP 10/09/2012   BMI 34.57 kg/m  Lungs are clear to A&P cardiac examination essentially unremarkable with regular rate and rhythm. No dominant mass or nodularity is noted in either breast in 2 positions examined. Incision is well-healed. No axillary or supraclavicular adenopathy is appreciated. Cosmetic result is excellent. Well-developed well-nourished patient in NAD. HEENT reveals PERLA, EOMI, discs not visualized.  Oral cavity is clear. No oral mucosal lesions are identified. Neck is clear without evidence of cervical or supraclavicular adenopathy. Lungs are clear to A&P. Cardiac examination is essentially unremarkable with regular rate and rhythm without murmur rub or thrill. Abdomen is benign with no organomegaly or masses noted. Motor sensory and DTR levels are equal and symmetric in the upper and lower extremities. Cranial nerves II through XII are grossly intact. Proprioception is intact. No peripheral adenopathy or edema is identified. No motor or sensory levels are noted. Crude visual fields are within normal  range.  RADIOLOGY RESULTS: No current films for review  PLAN: Present time she is doing well. She will be seeing medical oncology for possible antiestrogen therapy which I would recommend. Otherwise I've asked to see her back in 4-5 months for follow-up. Patient is otherwise doing well. She knows to call with any concerns.  I would like to take this opportunity to thank you for allowing me to participate in the care of your patient.Armstead Peaks., MD   a

## 2017-10-02 NOTE — Progress Notes (Signed)
[facility]  PATIENT: Jennifer Pratt DOB: 1961/07/13   REASON FOR VISIT: Follow-up for seizure disorder HISTORY FROM: Patient    HISTORY OF PRESENT ILLNESS:Jennifer Pratt is a 56 year old right-handed white female with a history of seizures, well controlled on Carbatrol. The patient indicates that her last seizure event was 17 years ago. The patient has not had any seizures since being on Carbatrol. Her seizures used to be associated with her menstrual cycle, but she has not had a period in over four years.  The patient had breast cancer surgery in 2023/08/11, her mother died of dementia in 2023-08-11   She continues to operate a motor vehicle.  She returns for reevaluation  REVIEW OF SYSTEMS: Full 14 system review of systems performed and notable only for those listed, all others are neg:  Constitutional: neg  Cardiovascular: neg Ear/Nose/Throat: neg  Skin: neg Eyes: neg Respiratory: neg Gastroitestinal: neg  Hematology/Lymphatic: neg  Endocrine: neg Musculoskeletal: Spinal stenosis Allergy/Immunology: neg Neurological: History of seizure disorder Psychiatric: neg Sleep : neg   ALLERGIES: Allergies  Allergen Reactions  . Lisinopril Other (See Comments)    UNKNOWN     HOME MEDICATIONS: Outpatient Medications Prior to Visit  Medication Sig Dispense Refill  . carbamazepine (CARBATROL) 200 MG 12 hr capsule TAKE 1 CAPSULE (200 MG TOTAL) BY MOUTH 2 (TWO) TIMES DAILY. 180 capsule 3  . diclofenac (VOLTAREN) 75 MG EC tablet Take 1 tablet (75 mg total) by mouth 2 (two) times daily as needed. (Patient taking differently: Take 75 mg by mouth 2 (two) times daily. ) 60 tablet 0  . DULoxetine (CYMBALTA) 60 MG capsule TAKE 1 CAPSULE BY MOUTH EVERY DAY 90 capsule 1  . fluticasone (FLONASE) 50 MCG/ACT nasal spray Place 1 spray into both nostrils daily as needed for allergies or rhinitis.    Marland Kitchen levothyroxine (SYNTHROID, LEVOTHROID) 175 MCG tablet TAKE 1 TABLET BY MOUTH EVERY DAY  (Patient taking differently: TAKE 1 TABLET BY MOUTH EVERY DAY-am) 90 tablet 1  . pravastatin (PRAVACHOL) 10 MG tablet TAKE 1 TABLET (10 MG TOTAL) BY MOUTH DAILY. (Patient taking differently: TAKE 1 TABLET (10 MG TOTAL) BY MOUTH DAILY-AM) 90 tablet 1  . raloxifene (EVISTA) 60 MG tablet Take 1 tablet (60 mg total) by mouth daily. 30 tablet 12  . valsartan (DIOVAN) 80 MG tablet Take 1 tablet (80 mg total) by mouth daily. PLEASE RESCHEDULE CANCELLED APPT FOR FURTHER REFILLS (Patient taking differently: Take 80 mg by mouth at bedtime. PLEASE RESCHEDULE CANCELLED APPT FOR FURTHER REFILLS) 90 tablet 1   No facility-administered medications prior to visit.     PAST MEDICAL HISTORY: Past Medical History:  Diagnosis Date  . Arthritis   . 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  . Hypercholesterolemia   . Hypertension   . Hypothyroidism   . 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 Sleep apnea    USES CPAP  . Ulcer    Gastric    PAST SURGICAL HISTORY: Past Surgical History:  Procedure Laterality Date  . BREAST BIOPSY Right 06/28/2017   Stereo affirm- UOQ/DUCTAL CARCINOMA IN SITU, HIGH NUCLEAR GRADE WITH COMEDONECROSIS   . BREAST EXCISIONAL BIOPSY Right 07/17/2017   WIde excision upper outer quadrant DCIS  Dr. Bary Castilla  . CESAREAN SECTION    . JOINT REPLACEMENT    . thumb surgery Right 2005   AND CTR  . THYROIDECTOMY  1988  . TOTAL  KNEE ARTHROPLASTY Left 2012    FAMILY HISTORY: Family History  Problem Relation Age of Onset  . Diabetes Father   . Dementia Mother   . Breast cancer Neg Hx   . Colon cancer Neg Hx     SOCIAL HISTORY: Social History   Socioeconomic History  . Marital status: Married    Spouse name: Not on file  . Number of children: 1  . Years of education: Not on file  . Highest education level: Not on file  Social Needs  . Financial resource strain: Not on file  . Food insecurity - worry: Not on  file  . Food insecurity - inability: Not on file  . Transportation needs - medical: Not on file  . Transportation needs - non-medical: Not on file  Occupational History    Employer: [employer]  Tobacco Use  . Smoking status: Current Some Day Smoker    Packs/day: 0.25    Types: Cigarettes  . Smokeless tobacco: Never Used  . Tobacco comment: PT ONLY SMOKES SOCIALLY-SHE MAY GO A FEW MONTHS WITHOUT EVER SMOKING   Substance and Sexual Activity  . Alcohol use: Yes    Alcohol/week: 0.0 oz    Comment: Socially  . Drug use: No  . Sexual activity: Yes  Other Topics Concern  . Not on file  Social History Narrative  . Not on file     PHYSICAL EXAM  Vitals:   10/03/17 1535  BP: (!) 155/97  Pulse: 89  Weight: 234 lb 12.8 oz (106.5 kg)   Body mass index is 34.67 kg/m. General: The patient is alert and cooperative at the time of the examination. The patient is moderately obese. Skin: No significant peripheral edema is noted. Neurologic Exam Mental status: The patient is oriented x 3. Cranial nerves: Facial symmetry is present. Speech is normal, no aphasia or dysarthria is noted. Extraocular movements are full. Visual fields are full. Motor: The patient has good strength in all 4 extremities. Sensory examination: Soft touch sensation is symmetric on the face, arms, and legs. Coordination: The patient has good finger-nose-finger and heel-to-shin bilaterally. Gait and station: The patient has a normal gait. Tandem gait is normal. Romberg is negative. No drift is seen. Reflexes: Deep tendon reflexes are symmetric upper and lower.    DIAGNOSTIC DATA (LABS, IMAGING, TESTING) - I reviewed patient records, labs, notes, testing and imaging myself where available.  Lab Results  Component Value Date   WBC 5.5 03/31/2017   HGB 13.8 03/31/2017   HCT 41.0 03/31/2017   MCV 90.8 03/31/2017   PLT 249.0 03/31/2017      Component Value Date/Time   NA 141 03/31/2017 0836   K 3.9  03/31/2017 0836   CL 105 03/31/2017 0836   CO2 28 03/31/2017 0836   GLUCOSE 99 03/31/2017 0836   BUN 17 03/31/2017 0836   CREATININE 0.87 03/31/2017 0836   CALCIUM 9.5 03/31/2017 0836   PROT 7.2 03/31/2017 0836   ALBUMIN 4.8 03/31/2017 0836   AST 16 03/31/2017 0836   ALT 27 03/31/2017 0836   ALKPHOS 73 03/31/2017 0836   BILITOT 0.5 03/31/2017 0836   GFRNONAA >60 08/12/2011 0512   GFRAA >60 08/12/2011 0512   Lab Results  Component Value Date   CHOL 241 (H) 03/31/2017   HDL 60.90 03/31/2017   LDLCALC 141 (H) 03/31/2017   LDLDIRECT 112.0 06/04/2015   TRIG 195.0 (H) 03/31/2017   CHOLHDL 4 03/31/2017   Lab Results  Component Value Date  HGBA1C 5.1 09/13/2016    Lab Results  Component Value Date   TSH 1.93 03/31/2017      ASSESSMENT AND PLAN  56 y.o. year old female  has a past medical history of seizure disorder here to follow-up. She has not had any seizure events for 17 years. She is well controlled on Carbatrol.  PLAN Continue Carbatrol at current dose will refill Will check Carbatrol level today to check for therapeutic level. Reviewed CBC and liver profile from May 2018 for adverse effects of Carbatrol Call for any seizure activity Follow-up yearly and when necessary Dennie Bible, [facility], [facility], APRN  [facility] [address], [place], [place] [address] [phone]

## 2017-10-03 ENCOUNTER — Encounter: Payer: Self-pay | Admitting: Nurse Practitioner

## 2017-10-03 ENCOUNTER — Ambulatory Visit: Payer: BLUE CROSS/BLUE SHIELD | Admitting: Nurse Practitioner

## 2017-10-03 VITALS — BP 155/97 | HR 89 | Wt 234.8 lb

## 2017-10-03 DIAGNOSIS — Z5181 Encounter for therapeutic drug level monitoring: Secondary | ICD-10-CM

## 2017-10-03 DIAGNOSIS — G40309 Generalized idiopathic epilepsy and epileptic syndromes, not intractable, without status epilepticus: Secondary | ICD-10-CM

## 2017-10-03 NOTE — Patient Instructions (Signed)
Continue Carbatrol at current dose will refill Will check Carbatrol level today . Reviewed CBC and liver profile from May 2018 Call for any seizure activity Follow-up yearly and when necessary

## 2017-10-04 ENCOUNTER — Telehealth: Payer: Self-pay | Admitting: *Deleted

## 2017-10-04 ENCOUNTER — Other Ambulatory Visit: Payer: Self-pay | Admitting: Nurse Practitioner

## 2017-10-04 LAB — CARBAMAZEPINE LEVEL, TOTAL: Carbamazepine (Tegretol), S: 5.9 ug/mL (ref 4.0–12.0)

## 2017-10-04 MED ORDER — CARBAMAZEPINE ER 200 MG PO CP12: ORAL_CAPSULE | ORAL | 3 refills | Status: DC

## 2017-10-04 NOTE — Telephone Encounter (Signed)
Spoke with patient and informed her that her Carbamazepine level is stable. She will continue the same dose. Advised her Hoyle Sauer sent in a refill today. She verbalized understanding, appreciation.

## 2017-10-09 ENCOUNTER — Encounter: Payer: Self-pay | Admitting: General Surgery

## 2017-10-09 ENCOUNTER — Ambulatory Visit (INDEPENDENT_AMBULATORY_CARE_PROVIDER_SITE_OTHER): Payer: BLUE CROSS/BLUE SHIELD | Admitting: General Surgery

## 2017-10-09 VITALS — BP 126/80 | HR 97 | Resp 12 | Ht 69.0 in | Wt 234.0 lb

## 2017-10-09 DIAGNOSIS — D0511 Intraductal carcinoma in situ of right breast: Secondary | ICD-10-CM

## 2017-10-09 NOTE — Patient Instructions (Addendum)
Patient to use cream on that area plus massage. Patient to return in August 2019 for bilateral diagnotic mammogram. Take a Aspirin 81 daily.

## 2017-10-09 NOTE — Progress Notes (Signed)
Patient ID: Jennifer Pratt, female   DOB: Dec 17, 1960, 56 y.o.   MRN: [id number]  Chief Complaint  Patient presents with  . Follow-up    HPI Jennifer Pratt is a 56 y.o. female here today for her one month follow up mammosite placement done on 08/16/2017. Patient states she is doing well. Tolerating the Evista without any discernible side effects.Marland Kitchen   HPI  Past Medical History:  Diagnosis Date  . Arthritis   . 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  . Hypercholesterolemia   . Hypertension   . Hypothyroidism   . 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 Sleep apnea    USES CPAP  . Ulcer    Gastric    Past Surgical History:  Procedure Laterality Date  . BREAST BIOPSY Right 06/28/2017   Stereo affirm- UOQ/DUCTAL CARCINOMA IN SITU, HIGH NUCLEAR GRADE WITH COMEDONECROSIS   . BREAST EXCISIONAL BIOPSY Right 07/17/2017   WIde excision upper outer quadrant DCIS  Dr. Bary Castilla  . CESAREAN SECTION    . JOINT REPLACEMENT    . thumb surgery Right 2005   AND CTR  . THYROIDECTOMY  1988  . TOTAL KNEE ARTHROPLASTY Left 2012    Family History  Problem Relation Age of Onset  . Diabetes Father   . Dementia Mother   . Breast cancer Neg Hx   . Colon cancer Neg Hx     Social History Social History   Tobacco Use  . Smoking status: Current Some Day Smoker    Packs/day: 0.25    Types: Cigarettes  . Smokeless tobacco: Never Used  . Tobacco comment: PT ONLY SMOKES SOCIALLY-SHE MAY GO A FEW MONTHS WITHOUT EVER SMOKING   Substance Use Topics  . Alcohol use: Yes    Alcohol/week: 0.0 oz    Comment: Socially  . Drug use: No    Allergies  Allergen Reactions  . Lisinopril Other (See Comments)    UNKNOWN     Current Outpatient Medications  Medication Sig Dispense Refill  . carbamazepine (CARBATROL) 200 MG 12 hr capsule TAKE 1 CAPSULE (200 MG TOTAL) BY MOUTH 2 (TWO) TIMES DAILY. 180 capsule 3  . diclofenac (VOLTAREN) 75  MG EC tablet Take 1 tablet (75 mg total) by mouth 2 (two) times daily as needed. (Patient taking differently: Take 75 mg by mouth 2 (two) times daily. ) 60 tablet 0  . DULoxetine (CYMBALTA) 60 MG capsule TAKE 1 CAPSULE BY MOUTH EVERY DAY 90 capsule 1  . fluticasone (FLONASE) 50 MCG/ACT nasal spray Place 1 spray into both nostrils daily as needed for allergies or rhinitis.    Marland Kitchen levothyroxine (SYNTHROID, LEVOTHROID) 175 MCG tablet TAKE 1 TABLET BY MOUTH EVERY DAY (Patient taking differently: TAKE 1 TABLET BY MOUTH EVERY DAY-am) 90 tablet 1  . pravastatin (PRAVACHOL) 10 MG tablet TAKE 1 TABLET (10 MG TOTAL) BY MOUTH DAILY. (Patient taking differently: TAKE 1 TABLET (10 MG TOTAL) BY MOUTH DAILY-AM) 90 tablet 1  . raloxifene (EVISTA) 60 MG tablet Take 1 tablet (60 mg total) by mouth daily. 30 tablet 12  . valsartan (DIOVAN) 80 MG tablet Take 1 tablet (80 mg total) by mouth daily. PLEASE RESCHEDULE CANCELLED APPT FOR FURTHER REFILLS (Patient taking differently: Take 80 mg by mouth at bedtime. PLEASE RESCHEDULE CANCELLED APPT FOR FURTHER REFILLS) 90 tablet 1   No current facility-administered medications for this visit.     Review of Systems  Review of Systems  Constitutional: Negative.   Respiratory: Negative.   Cardiovascular: Negative.     Blood pressure 126/80, pulse 97, resp. rate 12, height 5\' 9"  (1.753 m), weight 234 lb (106.1 kg), last menstrual period 10/09/2012.  Physical Exam Physical Exam  Constitutional: She is oriented to person, place, and time. She appears well-developed and well-nourished.  Pulmonary/Chest:    Neurological: She is alert and oriented to person, place, and time.  Skin: Skin is warm and dry.    Data Reviewed No new data.  Assessment    Doing well status post breast conservation.    Plan        Patient to use cream on that area plus massage. Patient to return in August 2019 for bilateral diagnotic mammogram. Take a Aspirin 81 daily.  HPI, Physical  Exam, Assessment and Plan have been scribed under the direction and in the presence of Hervey Ard, MD.  Gaspar Cola, CMA  I have completed the exam and reviewed the above documentation for accuracy and completeness.  I agree with the above.  Haematologist has been used and any errors in dictation or transcription are unintentional.  Hervey Ard, M.D., F.A.C.S.   Robert Bellow 10/09/2017, 10:53 AM

## 2017-10-10 ENCOUNTER — Ambulatory Visit: Payer: BLUE CROSS/BLUE SHIELD | Admitting: Nurse Practitioner

## 2017-10-19 ENCOUNTER — Other Ambulatory Visit: Payer: Self-pay | Admitting: Internal Medicine

## 2017-10-24 DIAGNOSIS — G4733 Obstructive sleep apnea (adult) (pediatric): Secondary | ICD-10-CM | POA: Diagnosis not present

## 2017-11-11 ENCOUNTER — Other Ambulatory Visit: Payer: Self-pay | Admitting: Internal Medicine

## 2017-12-05 ENCOUNTER — Other Ambulatory Visit: Payer: Self-pay | Admitting: Internal Medicine

## 2018-01-11 DIAGNOSIS — H43813 Vitreous degeneration, bilateral: Secondary | ICD-10-CM | POA: Diagnosis not present

## 2018-02-05 ENCOUNTER — Other Ambulatory Visit: Payer: Self-pay | Admitting: Internal Medicine

## 2018-02-20 ENCOUNTER — Other Ambulatory Visit: Payer: Self-pay | Admitting: Internal Medicine

## 2018-02-21 ENCOUNTER — Ambulatory Visit
Admission: RE | Admit: 2018-02-21 | Discharge: 2018-02-21 | Disposition: A | Discharge status: Home / Self Care | Payer: BLUE CROSS/BLUE SHIELD | Source: Ambulatory Visit | Attending: Radiation Oncology | Admitting: Radiation Oncology

## 2018-02-21 ENCOUNTER — Encounter: Payer: Self-pay | Admitting: Radiation Oncology

## 2018-02-21 ENCOUNTER — Other Ambulatory Visit: Payer: Self-pay

## 2018-02-21 ENCOUNTER — Encounter: Payer: Self-pay | Admitting: Internal Medicine

## 2018-02-21 VITALS — BP 164/107 | HR 89 | Temp 99.0°F | Resp 20 | Wt 231.2 lb

## 2018-02-21 DIAGNOSIS — Z170 Estrogen receptor positive status [ER+]: Secondary | ICD-10-CM | POA: Diagnosis not present

## 2018-02-21 DIAGNOSIS — Z79811 Long term (current) use of aromatase inhibitors: Secondary | ICD-10-CM | POA: Insufficient documentation

## 2018-02-21 DIAGNOSIS — Z923 Personal history of irradiation: Secondary | ICD-10-CM | POA: Insufficient documentation

## 2018-02-21 DIAGNOSIS — D0511 Intraductal carcinoma in situ of right breast: Secondary | ICD-10-CM | POA: Diagnosis not present

## 2018-02-21 NOTE — Progress Notes (Signed)
Radiation Oncology Follow up Note  Name: Jennifer Pratt   Date:   02/21/2018 MRN:  [id number] DOB: 1961/01/06    This 57 y.o. female presents to the clinic today for six-month follow-up status post accelerated partial breast irradiation to her right breast for ductal carcinoma in situ.  REFERRING PROVIDER: Einar Pheasant, MD  HPI: patient is a 57 year old female now out 6 months having completed accelerated partial breast irradiation to her right breast for ER/PR positive ductal carcinoma in situ. Seen today in routine follow-up she is doing well. She's not yet followed up with her surgeon we haverequested to make an appointment as soon as possible and also have follow-up mammograms. She is currently on.Evistaand tolerating that well without side effect. She specifically denies breast tenderness cough or bone pain.  COMPLICATIONS OF TREATMENT: none  FOLLOW UP COMPLIANCE: keeps appointments   PHYSICAL EXAM:  BP (!) 164/107   Pulse 89   Temp 99 F (37.2 C)   Resp 20   Wt 231 lb 2.4 oz (104.9 kg)   LMP 10/09/2012   BMI 34.14 kg/m  Lungs are clear to A&P cardiac examination essentially unremarkable with regular rate and rhythm. No dominant mass or nodularity is noted in either breast in 2 positions examined. Incision is well-healed. No axillary or supraclavicular adenopathy is appreciated. Cosmetic result is excellent.some fullness in the lumpectomy site which would expect from high dose rate remote afterloading. Well-developed well-nourished patient in NAD. HEENT reveals PERLA, EOMI, discs not visualized.  Oral cavity is clear. No oral mucosal lesions are identified. Neck is clear without evidence of cervical or supraclavicular adenopathy. Lungs are clear to A&P. Cardiac examination is essentially unremarkable with regular rate and rhythm without murmur rub or thrill. Abdomen is benign with no organomegaly or masses noted. Motor sensory and DTR levels are equal and symmetric in the upper  and lower extremities. Cranial nerves II through XII are grossly intact. Proprioception is intact. No peripheral adenopathy or edema is identified. No motor or sensory levels are noted. Crude visual fields are within normal range.  RADIOLOGY RESULTS: mammograms have been requested  PLAN: present time patient is doing well recovering nicely from her radiation therapy treatments. I'm please were overall progress. She continues on Evista without side effect. I've asked her to personally schedule appointment with Dr. Tollie Pizza so he can order mammograms and continue to follow her. I have asked to see her back in 6 months for follow-up. Patient knows to call sooner with any concerns.  I would like to take this opportunity to thank you for allowing me to participate in the care of your patient.Noreene Filbert, MD

## 2018-02-22 ENCOUNTER — Other Ambulatory Visit: Payer: Self-pay

## 2018-02-22 MED ORDER — VALSARTAN 80 MG PO TABS: 80.0000 mg | ORAL_TABLET | Freq: Every day | ORAL | 0 refills | Status: DC

## 2018-02-22 NOTE — Telephone Encounter (Signed)
Spoke with patient and refilled her valsartan until appt. Advised patient to call us next time she needed her precription if pharmacy would not fill.

## 2018-05-18 ENCOUNTER — Other Ambulatory Visit: Payer: Self-pay | Admitting: Internal Medicine

## 2018-05-21 ENCOUNTER — Other Ambulatory Visit: Payer: Self-pay | Admitting: Internal Medicine

## 2018-05-22 ENCOUNTER — Encounter: Payer: Self-pay | Admitting: Internal Medicine

## 2018-05-22 ENCOUNTER — Other Ambulatory Visit: Payer: Self-pay | Admitting: Internal Medicine

## 2018-05-23 ENCOUNTER — Other Ambulatory Visit: Payer: Self-pay

## 2018-05-23 DIAGNOSIS — D0511 Intraductal carcinoma in situ of right breast: Secondary | ICD-10-CM

## 2018-06-02 ENCOUNTER — Other Ambulatory Visit: Payer: Self-pay | Admitting: Internal Medicine

## 2018-06-04 ENCOUNTER — Other Ambulatory Visit: Payer: Self-pay | Admitting: Internal Medicine

## 2018-06-04 NOTE — Telephone Encounter (Signed)
I refilled x 1.  She needs to keep her 06/07/18 appt.

## 2018-06-04 NOTE — Telephone Encounter (Signed)
Last OV or TSH 03/31/17 ?

## 2018-06-07 ENCOUNTER — Ambulatory Visit (INDEPENDENT_AMBULATORY_CARE_PROVIDER_SITE_OTHER): Payer: BLUE CROSS/BLUE SHIELD | Admitting: Internal Medicine

## 2018-06-07 ENCOUNTER — Encounter: Payer: Self-pay | Admitting: Internal Medicine

## 2018-06-07 VITALS — BP 146/92 | HR 92 | Ht 68.0 in | Wt 230.4 lb

## 2018-06-07 DIAGNOSIS — G40909 Epilepsy, unspecified, not intractable, without status epilepticus: Secondary | ICD-10-CM

## 2018-06-07 DIAGNOSIS — Z0000 Encounter for general adult medical examination without abnormal findings: Secondary | ICD-10-CM

## 2018-06-07 DIAGNOSIS — D0511 Intraductal carcinoma in situ of right breast: Secondary | ICD-10-CM

## 2018-06-07 DIAGNOSIS — E039 Hypothyroidism, unspecified: Secondary | ICD-10-CM

## 2018-06-07 DIAGNOSIS — I10 Essential (primary) hypertension: Secondary | ICD-10-CM

## 2018-06-07 DIAGNOSIS — G4733 Obstructive sleep apnea (adult) (pediatric): Secondary | ICD-10-CM

## 2018-06-07 DIAGNOSIS — G40309 Generalized idiopathic epilepsy and epileptic syndromes, not intractable, without status epilepticus: Secondary | ICD-10-CM

## 2018-06-07 DIAGNOSIS — E7800 Pure hypercholesterolemia, unspecified: Secondary | ICD-10-CM | POA: Diagnosis not present

## 2018-06-07 DIAGNOSIS — F439 Reaction to severe stress, unspecified: Secondary | ICD-10-CM

## 2018-06-07 MED ORDER — VALSARTAN 160 MG PO TABS: 160.0000 mg | ORAL_TABLET | Freq: Every day | ORAL | 2 refills | Status: DC

## 2018-06-07 NOTE — Progress Notes (Signed)
Patient ID: Jennifer Pratt, female   DOB: 1961-05-31, 57 y.o.   MRN: [id number]   Subjective:    Patient ID: Jennifer Pratt, female    DOB: 19-Oct-1961, 57 y.o.   MRN: [id number]  HPI  Patient here for her physical exam.  Diagnosed with DCIS.  Sees Dr Bary Castilla and Dr Donella Stade.  Is s/p XRT.  On evista.  Doing well.  Has f/u planned in 06/2018.  Tries to stay active.  No chest pain.  No sob.  No acid reflux.  No abdominal pain.  Bowels moving.  States blood pressures averaging 130-140/70s.     Past Medical History:  Diagnosis Date  . Arthritis   . Breast cancer ([place]) 06/28/2017   Grade 3, DCIS, 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  . Hypercholesterolemia   . Hypertension   . Hypothyroidism   . 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 Sleep apnea    USES CPAP  . Ulcer    Gastric   Past Surgical History:  Procedure Laterality Date  . BREAST BIOPSY Right 06/28/2017   Stereo affirm- UOQ/DUCTAL CARCINOMA IN SITU, HIGH NUCLEAR GRADE WITH COMEDONECROSIS   . BREAST EXCISIONAL BIOPSY Right 07/17/2017   WIde excision upper outer quadrant DCIS  Dr. Bary Castilla  . CESAREAN SECTION    . COLONOSCOPY N/A 10/05/2016   Procedure: COLONOSCOPY;  Surgeon: Manya Silvas, MD;  Location: [facility] ENDOSCOPY;  Service: Endoscopy;  Laterality: N/A;  Zosyn IVPB  . JOINT REPLACEMENT    . MASTECTOMY, PARTIAL Right 07/17/2017   Procedure: MASTECTOMY PARTIAL;  Surgeon: Robert Bellow, MD;  Location: [facility] ORS;  Service: General;  Laterality: Right;  . thumb surgery Right 2005   AND CTR  . THYROIDECTOMY  1988  . TOTAL KNEE ARTHROPLASTY Left 2012   Family History  Problem Relation Age of Onset  . Diabetes Father   . Dementia Mother   . Breast cancer Neg Hx   . Colon cancer Neg Hx    Social History   Socioeconomic History  . Marital status: Married    Spouse name: Not on file  . Number of children: 1  . Years of education: Not on file  . Highest education level: Not on file    Occupational History    Employer: [employer]  Social Needs  . Financial resource strain: Not on file  . Food insecurity:    Worry: Not on file    Inability: Not on file  . Transportation needs:    Medical: Not on file    Non-medical: Not on file  Tobacco Use  . Smoking status: Current Some Day Smoker    Packs/day: 0.25    Types: Cigarettes  . Smokeless tobacco: Never Used  . Tobacco comment: PT ONLY SMOKES SOCIALLY-SHE MAY GO A FEW MONTHS WITHOUT EVER SMOKING   Substance and Sexual Activity  . Alcohol use: Yes    Alcohol/week: 0.0 oz    Comment: Socially  . Drug use: No  . Sexual activity: Yes  Lifestyle  . Physical activity:    Days per week: Not on file    Minutes per session: Not on file  . Stress: Not on file  Relationships  . Social connections:    Talks on phone: Not on file    Gets together: Not on file    Attends religious service: Not on file    Active member of club or organization: Not on file    Attends  meetings of clubs or organizations: Not on file    Relationship status: Not on file  Other Topics Concern  . Not on file  Social History Narrative  . Not on file    Outpatient Encounter Medications as of 06/07/2018  Medication Sig  . carbamazepine (CARBATROL) 200 MG 12 hr capsule TAKE 1 CAPSULE (200 MG TOTAL) BY MOUTH 2 (TWO) TIMES DAILY.  Marland Kitchen diclofenac (VOLTAREN) 75 MG EC tablet Take 1 tablet (75 mg total) by mouth 2 (two) times daily as needed. (Patient taking differently: Take 75 mg by mouth 2 (two) times daily. )  . fluticasone (FLONASE) 50 MCG/ACT nasal spray Place 1 spray into both nostrils daily as needed for allergies or rhinitis.  Marland Kitchen levothyroxine (SYNTHROID, LEVOTHROID) 175 MCG tablet TAKE 1 TABLET BY MOUTH EVERY DAY  . raloxifene (EVISTA) 60 MG tablet Take 1 tablet (60 mg total) by mouth daily.  . [DISCONTINUED] DULoxetine (CYMBALTA) 60 MG capsule TAKE 1 CAPSULE BY MOUTH EVERY DAY  . [DISCONTINUED] pravastatin (PRAVACHOL) 10 MG tablet TAKE 1  TABLET BY MOUTH EVERY DAY  . [DISCONTINUED] valsartan (DIOVAN) 80 MG tablet TAKE 1 TABLET (80 MG TOTAL) BY MOUTH DAILY. NEED APPT FOR FURTHER REFILLS. PLEASE CALL OFFICE SOON  . valsartan (DIOVAN) 160 MG tablet Take 1 tablet (160 mg total) by mouth daily.  . [DISCONTINUED] KLOR-CON 10 10 MEQ tablet TAKE 1 TABLET (10 MEQ TOTAL) BY MOUTH 2 (TWO) TIMES DAILY.   No facility-administered encounter medications on file as of 06/07/2018.     Review of Systems  Constitutional: Negative for appetite change and unexpected weight change.  HENT: Negative for congestion and sinus pressure.   Eyes: Negative for pain and visual disturbance.  Respiratory: Negative for cough, chest tightness and shortness of breath.   Cardiovascular: Negative for chest pain, palpitations and leg swelling.  Gastrointestinal: Negative for abdominal pain, diarrhea, nausea and vomiting.  Genitourinary: Negative for difficulty urinating and dysuria.  Musculoskeletal: Negative for joint swelling and myalgias.       Back pain stable.   Skin: Negative for color change and rash.  Neurological: Negative for dizziness, light-headedness and headaches.  Hematological: Negative for adenopathy. Does not bruise/bleed easily.  Psychiatric/Behavioral: Negative for agitation and dysphoric mood.       Objective:    Physical Exam  Constitutional: She is oriented to person, place, and time. She appears well-developed and well-nourished. No distress.  HENT:  Nose: Nose normal.  Mouth/Throat: Oropharynx is clear and moist.  Eyes: Right eye exhibits no discharge. Left eye exhibits no discharge. No scleral icterus.  Neck: Neck supple. No thyromegaly present.  Cardiovascular: Normal rate and regular rhythm.  Pulmonary/Chest: Breath sounds normal. No accessory muscle usage. No tachypnea. No respiratory distress. She has no decreased breath sounds. She has no wheezes. She has no rhonchi. Right breast exhibits no inverted nipple, no mass, no  nipple discharge and no tenderness (no axillary adenopathy). Left breast exhibits no inverted nipple, no mass, no nipple discharge and no tenderness (no axilarry adenopathy).  Abdominal: Soft. Bowel sounds are normal. There is no tenderness.  Musculoskeletal: She exhibits no edema or tenderness.  Lymphadenopathy:    She has no cervical adenopathy.  Neurological: She is alert and oriented to person, place, and time.  Skin: Skin is warm. No rash noted.  Psychiatric: She has a normal mood and affect. Her behavior is normal.    BP (!) 146/92 (BP Location: Right Arm, Patient Position: Sitting, Cuff Size: Normal)   Pulse 92  Ht 5\' 8"  (1.727 m)   Wt 230 lb 6.4 oz (104.5 kg)   LMP 10/09/2012   SpO2 97%   BMI 35.03 kg/m  Wt Readings from Last 3 Encounters:  06/07/18 230 lb 6.4 oz (104.5 kg)  02/21/18 231 lb 2.4 oz (104.9 kg)  10/09/17 234 lb (106.1 kg)     Lab Results  Component Value Date   WBC 5.0 06/08/2018   HGB 13.1 06/08/2018   HCT 38.1 06/08/2018   PLT 228.0 06/08/2018   GLUCOSE 103 (H) 06/08/2018   CHOL 206 (H) 06/08/2018   TRIG 149.0 06/08/2018   HDL 63.30 06/08/2018   LDLDIRECT 112.0 06/04/2015   LDLCALC 112 (H) 06/08/2018   ALT 24 06/08/2018   AST 18 06/08/2018   NA 142 06/08/2018   K 3.6 06/08/2018   CL 104 06/08/2018   CREATININE 0.90 06/08/2018   BUN 22 06/08/2018   CO2 28 06/08/2018   TSH 1.17 06/08/2018   INR 1.41 08/12/2011   HGBA1C 5.1 09/13/2016       Assessment & Plan:   Problem List Items Addressed This Visit    Ductal carcinoma in situ (DCIS) of right breast    On evista.  Sees Dr Bary Castilla.  Has f/u planned in 06/2018.  Also sees Dr Donella Stade.        Essential hypertension, benign    Blood pressure remaining elevated.  Increase diovan to 160mg  q day.  Follow.        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  Stable.  On carbatrol.  Check levels.        Health care maintenance    Physical today 06/07/18.  PAP 07/01/16 - negative with negative HPV.  Colonoscopy - 09/2016.        Hypercholesterolemia   Relevant Medications   valsartan (DIOVAN) 160 MG tablet   Other Relevant Orders   Hepatic function panel (Completed)   Lipid panel (Completed)   Hypothyroidism    On thyroid replacement.  Follow tsh.        Relevant Orders   TSH (Completed)   Obstructive sleep apnea    CPAP.       Seizure disorder ([place])    Followed by neurology.  On carbatrol.        Relevant Orders   Carbamazepine Level (Tegretol), total (Completed)   Stress    On cymbalta.  Doing well.  Follow.         Other Visit Diagnoses    Routine general medical examination at a health care facility    -  Primary       Einar Pheasant, MD

## 2018-06-07 NOTE — Patient Instructions (Signed)
Increase diovan to 160mg  per day.

## 2018-06-07 NOTE — Assessment & Plan Note (Signed)
Physical today 06/07/18.  PAP 07/01/16 - negative with negative HPV.  Colonoscopy - 09/2016.

## 2018-06-08 ENCOUNTER — Other Ambulatory Visit (INDEPENDENT_AMBULATORY_CARE_PROVIDER_SITE_OTHER): Payer: BLUE CROSS/BLUE SHIELD

## 2018-06-08 DIAGNOSIS — I10 Essential (primary) hypertension: Secondary | ICD-10-CM

## 2018-06-08 DIAGNOSIS — E039 Hypothyroidism, unspecified: Secondary | ICD-10-CM

## 2018-06-08 DIAGNOSIS — E7800 Pure hypercholesterolemia, unspecified: Secondary | ICD-10-CM

## 2018-06-08 DIAGNOSIS — G40909 Epilepsy, unspecified, not intractable, without status epilepticus: Secondary | ICD-10-CM | POA: Diagnosis not present

## 2018-06-08 LAB — CBC WITH DIFFERENTIAL/PLATELET
BASOS PCT: 1.3 % (ref 0.0–3.0)
Basophils Absolute: 0.1 10*3/uL (ref 0.0–0.1)
EOS ABS: 0.1 10*3/uL (ref 0.0–0.7)
EOS PCT: 2.3 % (ref 0.0–5.0)
HEMATOCRIT: 38.1 % (ref 36.0–46.0)
HEMOGLOBIN: 13.1 g/dL (ref 12.0–15.0)
Lymphocytes Relative: 38 % (ref 12.0–46.0)
Lymphs Abs: 1.9 10*3/uL (ref 0.7–4.0)
MCHC: 34.3 g/dL (ref 30.0–36.0)
MCV: 90.6 fl (ref 78.0–100.0)
MONO ABS: 0.4 10*3/uL (ref 0.1–1.0)
Monocytes Relative: 8.1 % (ref 3.0–12.0)
NEUTROS ABS: 2.5 10*3/uL (ref 1.4–7.7)
Neutrophils Relative %: 50.3 % (ref 43.0–77.0)
Platelets: 228 10*3/uL (ref 150.0–400.0)
RBC: 4.21 Mil/uL (ref 3.87–5.11)
RDW: 12.7 % (ref 11.5–15.5)
WBC: 5 10*3/uL (ref 4.0–10.5)

## 2018-06-08 LAB — LIPID PANEL
CHOLESTEROL: 206 mg/dL — AB (ref 0–200)
HDL: 63.3 mg/dL (ref >39.00)
LDL Cholesterol: 112 mg/dL — ABNORMAL HIGH (ref 0–99)
NONHDL: 142.2
Total CHOL/HDL Ratio: 3
Triglycerides: 149 mg/dL (ref 0.0–149.0)
VLDL: 29.8 mg/dL (ref 0.0–40.0)

## 2018-06-08 LAB — HEPATIC FUNCTION PANEL
ALT: 24 U/L (ref 0–35)
AST: 18 U/L (ref 0–37)
Albumin: 4.4 g/dL (ref 3.5–5.2)
Alkaline Phosphatase: 57 U/L (ref 39–117)
BILIRUBIN DIRECT: 0.1 mg/dL (ref 0.0–0.3)
BILIRUBIN TOTAL: 0.5 mg/dL (ref 0.2–1.2)
Total Protein: 7.1 g/dL (ref 6.0–8.3)

## 2018-06-08 LAB — BASIC METABOLIC PANEL
BUN: 22 mg/dL (ref 6–23)
CALCIUM: 9.2 mg/dL (ref 8.4–10.5)
CHLORIDE: 104 meq/L (ref 96–112)
CO2: 28 mEq/L (ref 19–32)
CREATININE: 0.9 mg/dL (ref 0.40–1.20)
GFR: 68.75 mL/min (ref >60.00)
Glucose, Bld: 103 mg/dL — ABNORMAL HIGH (ref 70–99)
Potassium: 3.6 mEq/L (ref 3.5–5.1)
Sodium: 142 mEq/L (ref 135–145)

## 2018-06-08 LAB — TSH: TSH: 1.17 u[IU]/mL (ref 0.35–4.50)

## 2018-06-09 LAB — CARBAMAZEPINE LEVEL, TOTAL: CARBAMAZEPINE LVL: 6.1 mg/L (ref 4.0–12.0)

## 2018-06-11 ENCOUNTER — Other Ambulatory Visit: Payer: Self-pay

## 2018-06-11 MED ORDER — PRAVASTATIN SODIUM 20 MG PO TABS: 20.0000 mg | ORAL_TABLET | Freq: Every day | ORAL | 3 refills | Status: DC

## 2018-06-12 ENCOUNTER — Encounter: Payer: Self-pay | Admitting: Internal Medicine

## 2018-06-12 NOTE — Assessment & Plan Note (Signed)
CPAP.  

## 2018-06-12 NOTE — Assessment & Plan Note (Signed)
On cymbalta.  Doing well.  Follow.

## 2018-06-12 NOTE — Assessment & Plan Note (Signed)
Followed by Dr Jannifer Franklin and Cecille Rubin.  Stable.  On carbatrol.  Check levels.

## 2018-06-12 NOTE — Assessment & Plan Note (Signed)
Blood pressure remaining elevated.  Increase diovan to 160mg  q day.  Follow.

## 2018-06-12 NOTE — Assessment & Plan Note (Signed)
Followed by neurology.  On carbatrol.   

## 2018-06-12 NOTE — Assessment & Plan Note (Signed)
On evista.  Sees Dr Bary Castilla.  Has f/u planned in 06/2018.  Also sees Dr Donella Stade.

## 2018-06-12 NOTE — Assessment & Plan Note (Signed)
On thyroid replacement.  Follow tsh.  

## 2018-06-13 ENCOUNTER — Other Ambulatory Visit: Payer: Self-pay | Admitting: Internal Medicine

## 2018-06-18 ENCOUNTER — Encounter: Payer: Self-pay | Admitting: Internal Medicine

## 2018-06-18 ENCOUNTER — Ambulatory Visit: Payer: BLUE CROSS/BLUE SHIELD | Admitting: Internal Medicine

## 2018-06-18 DIAGNOSIS — G4733 Obstructive sleep apnea (adult) (pediatric): Secondary | ICD-10-CM

## 2018-06-18 DIAGNOSIS — I10 Essential (primary) hypertension: Secondary | ICD-10-CM

## 2018-06-18 DIAGNOSIS — E039 Hypothyroidism, unspecified: Secondary | ICD-10-CM

## 2018-06-18 DIAGNOSIS — F419 Anxiety disorder, unspecified: Secondary | ICD-10-CM | POA: Diagnosis not present

## 2018-06-18 DIAGNOSIS — D0511 Intraductal carcinoma in situ of right breast: Secondary | ICD-10-CM | POA: Diagnosis not present

## 2018-06-18 MED ORDER — BUSPIRONE HCL 5 MG PO TABS: 5.0000 mg | ORAL_TABLET | Freq: Two times a day (BID) | ORAL | 0 refills | Status: DC

## 2018-06-18 NOTE — Telephone Encounter (Signed)
Pt scheduled  

## 2018-06-18 NOTE — Telephone Encounter (Signed)
Please call pt and notify her that I can work her in today at 4:30. Work in for this.  Thanks

## 2018-06-18 NOTE — Progress Notes (Signed)
Patient ID: Jennifer Pratt, female   DOB: March 05, 1961, 57 y.o.   MRN: [id number]   Subjective:    Patient ID: Jennifer Pratt, female    DOB: 05/19/1961, 57 y.o.   MRN: [id number]  HPI  Patient here as a work in with concerns regarding increased stress and anxiety.  She reports symptoms really worsened since her recent visit here.  I saw her on 06/07/18.  States her job is going well.  Some increased anxiety related to her upcoming f/u regarding her breast.  Her son has been out of the country and she reports increased anxiety with this as well.  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  Previously she felt she was handling things ok.  Recently reports increased anxiety and not sure exactly why.  Does report increased hot flashes.  Described as intense.  Is Pratt short tempered.  Will cry for no reason.  Also reports feeling Pratt brain fog and is Pratt self conscious.  Feels some loss of purpose.  She declines any suicidal ideations.  Also reports that she would not hurt anyone else.  Discussed at length with her.  She was questioning if the evista could be contributing to any of her symptoms.  Eating.  Is sleeping ok.  No vomiting.  Bowels stable.  No chest pain.  Breathing stable.     Past Medical History:  Diagnosis Date  . Arthritis   . 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  . Hypercholesterolemia   . Hypertension   . Hypothyroidism   . 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 Sleep apnea    USES CPAP  . Ulcer    Gastric   Past Surgical History:  Procedure Laterality Date  . BREAST BIOPSY Right 06/28/2017   Stereo affirm- UOQ/DUCTAL CARCINOMA IN SITU, HIGH NUCLEAR GRADE WITH COMEDONECROSIS   . BREAST EXCISIONAL BIOPSY Right 07/17/2017   WIde excision upper outer quadrant DCIS  Dr. Bary Castilla  . CESAREAN SECTION    . COLONOSCOPY N/A 10/05/2016   Procedure: COLONOSCOPY;  Surgeon: Manya Silvas, MD;   Location: [facility] ENDOSCOPY;  Service: Endoscopy;  Laterality: N/A;  Zosyn IVPB  . JOINT REPLACEMENT    . MASTECTOMY, PARTIAL Right 07/17/2017   Procedure: MASTECTOMY PARTIAL;  Surgeon: Robert Bellow, MD;  Location: [facility] ORS;  Service: General;  Laterality: Right;  . thumb surgery Right 2005   AND CTR  . THYROIDECTOMY  1988  . TOTAL KNEE ARTHROPLASTY Left 2012   Family History  Problem Relation Age of Onset  . Diabetes Father   . Dementia Mother   . Breast cancer Neg Hx   . Colon cancer Neg Hx    Social History   Socioeconomic History  . Marital status: Married    Spouse name: Not on file  . Number of children: 1  . Years of education: Not on file  . Highest education level: Not on file  Occupational History    Employer: [employer]  Social Needs  . Financial resource strain: Not on file  . Food insecurity:    Worry: Not on file    Inability: Not on file  . Transportation needs:    Medical: Not on file    Non-medical: Not on file  Tobacco Use  . Smoking status: Current Some Day Smoker    Packs/day: 0.25    Types: Cigarettes  . Smokeless  tobacco: Never Used  . Tobacco comment: PT ONLY SMOKES SOCIALLY-SHE MAY GO A FEW MONTHS WITHOUT EVER SMOKING   Substance and Sexual Activity  . Alcohol use: Yes    Alcohol/week: 0.0 oz    Comment: Socially  . Drug use: No  . Sexual activity: Yes  Lifestyle  . Physical activity:    Days per week: Not on file    Minutes per session: Not on file  . Stress: Not on file  Relationships  . Social connections:    Talks on phone: Not on file    Gets together: Not on file    Attends religious service: Not on file    Active member of club or organization: Not on file    Attends meetings of clubs or organizations: Not on file    Relationship status: Not on file  Other Topics Concern  . Not on file  Social History Narrative  . Not on file    Outpatient Encounter Medications as of 06/18/2018  Medication Sig  . carbamazepine  (CARBATROL) 200 MG 12 hr capsule TAKE 1 CAPSULE (200 MG TOTAL) BY MOUTH 2 (TWO) TIMES DAILY.  Marland Kitchen diclofenac (VOLTAREN) 75 MG EC tablet Take 1 tablet (75 mg total) by mouth 2 (two) times daily as needed. (Patient taking differently: Take 75 mg by mouth 2 (two) times daily. )  . DULoxetine (CYMBALTA) 60 MG capsule TAKE 1 CAPSULE BY MOUTH EVERY DAY  . fluticasone (FLONASE) 50 MCG/ACT nasal spray Place 1 spray into both nostrils daily as needed for allergies or rhinitis.  Marland Kitchen levothyroxine (SYNTHROID, LEVOTHROID) 175 MCG tablet TAKE 1 TABLET BY MOUTH EVERY DAY  . pravastatin (PRAVACHOL) 20 MG tablet Take 1 tablet (20 mg total) by mouth daily.  . raloxifene (EVISTA) 60 MG tablet Take 1 tablet (60 mg total) by mouth daily.  . valsartan (DIOVAN) 160 MG tablet Take 1 tablet (160 mg total) by mouth daily.  . busPIRone (BUSPAR) 5 MG tablet Take 1 tablet (5 mg total) by mouth 2 (two) times daily.  . [DISCONTINUED] KLOR-CON 10 10 MEQ tablet TAKE 1 TABLET (10 MEQ TOTAL) BY MOUTH 2 (TWO) TIMES DAILY.   No facility-administered encounter medications on file as of 06/18/2018.     Review of Systems  Constitutional: Negative for appetite change and unexpected weight change.  HENT: Negative for congestion.   Respiratory: Negative for cough, chest tightness and shortness of breath.   Cardiovascular: Negative for chest pain, palpitations and leg swelling.  Gastrointestinal: Negative for abdominal pain, diarrhea and nausea.  Endocrine:       Hot flashes as outlined.    Musculoskeletal: Negative for joint swelling and myalgias.  Neurological: Negative for light-headedness and headaches.  Psychiatric/Behavioral: Negative for suicidal ideas.       Increased anxiety and stress as outlined.         Objective:     Blood pressure rechecked by me:  144/88  Physical Exam  Constitutional: She appears well-developed and well-nourished. No distress.  HENT:  Nose: Nose normal.  Mouth/Throat: Oropharynx is clear and  moist.  Neck: Neck supple. No thyromegaly present.  Cardiovascular: Normal rate and regular rhythm.  Pulmonary/Chest: Breath sounds normal. No respiratory distress. She has no wheezes.  Musculoskeletal: She exhibits no edema or tenderness.  Lymphadenopathy:    She has no cervical adenopathy.  Skin: No rash noted. No erythema.  Psychiatric: Her behavior is normal.  Crying intermittently through the exam.      BP (!) 144/88  Pulse 86   Temp 98.3 F (36.8 C) (Oral)   Resp 18   Wt 233 lb 3.2 oz (105.8 kg)   LMP 10/09/2012   SpO2 98%   BMI 35.46 kg/m  Wt Readings from Last 3 Encounters:  06/18/18 233 lb 3.2 oz (105.8 kg)  06/07/18 230 lb 6.4 oz (104.5 kg)  02/21/18 231 lb 2.4 oz (104.9 kg)     Lab Results  Component Value Date   WBC 5.0 06/08/2018   HGB 13.1 06/08/2018   HCT 38.1 06/08/2018   PLT 228.0 06/08/2018   GLUCOSE 103 (H) 06/08/2018   CHOL 206 (H) 06/08/2018   TRIG 149.0 06/08/2018   HDL 63.30 06/08/2018   LDLDIRECT 112.0 06/04/2015   LDLCALC 112 (H) 06/08/2018   ALT 24 06/08/2018   AST 18 06/08/2018   NA 142 06/08/2018   K 3.6 06/08/2018   CL 104 06/08/2018   CREATININE 0.90 06/08/2018   BUN 22 06/08/2018   CO2 28 06/08/2018   TSH 1.17 06/08/2018   INR 1.41 08/12/2011   HGBA1C 5.1 09/13/2016       Assessment & Plan:   Problem List Items Addressed This Visit    Anxiety    Increased stress and anxiety as outlined.  Feel is multifactorial.  Increased stress related to her upcoming appt regarding f/u on her breast.  Her son has been out of the country.  Taking care of family members, etc.  On cymbalta for pain.  Will add buspar 5mg  bid.  Pt preferred to start low dose.  Discussed with Dr Nicolasa Ducking.  Will arrange f/u with her.  No suicidal ideations.  States she would not hurt herself and would not hurt anyone else.        Relevant Medications   busPIRone (BUSPAR) 5 MG tablet   Other Relevant Orders   Ambulatory referral to Psychiatry   Ductal carcinoma  in situ (DCIS) of right breast    Has planned f/u in 06/2018.  She was questioning if some of her symptoms could be related to evista.  Discussed may aggravate hot flashes.  Do not feel evista is contributing to the increased anxiety, etc.  D/w Dr Preston Fleeting.        Essential hypertension, benign    Blood pressure elevated.  Just increased diovan to 160mg  q day.  On recheck improved.  Hold on further changes in blood pressure medication at this time.  Follow.        Hypothyroidism    On thyroid replacement.  Follow tsh.  Recent tsh wnl.       Obstructive sleep apnea    CPAP.          I spent 40 minutes with the patient and Pratt than 50% of the time was spent in consultation regarding the above.  Time spent discussing her current concerns and symptoms.  Time also spent discussing treatment options and further f/u with psychiatry.    Einar Pheasant, MD

## 2018-06-18 NOTE — Telephone Encounter (Signed)
Called patient and spoke with her. She has been on raloxifene since 08/2017 and has tolerated well. She stated that about a week ago she took her family on a trip and it was very stressful and she noticed a change in her moods once she returned. Her anxiety was increased, it was hot, and she was very short tempered. She says she almost feels like she is going through menopause. 3 days ago these sx became worse and she stated that she just isolated herself because she knew it was not like her to feel this way. She took a xanax and it did not help. She would go from crying, to irritated, and then enraged. Patient confirmed she is not suicidal and does not have any guns in the house. She did not work today because she did not want to drive with fear of road rage. Patient is wanting to know if she needs to have her hormone levels checked or if she can just stop this medication with out being weaned off. Medication was prescribed by Dr. Bary Castilla. I advised patient that I would send message to Dr. Nicki Reaper but she should also call Dr. Bary Castilla as well and let him know what is going on. Patient stated she will do so but feels more comfortable to let Dr. Nicki Reaper know. Stating she just feels like a mess and this is not like her at all.

## 2018-06-19 ENCOUNTER — Encounter: Payer: Self-pay | Admitting: Internal Medicine

## 2018-06-19 DIAGNOSIS — F419 Anxiety disorder, unspecified: Secondary | ICD-10-CM | POA: Insufficient documentation

## 2018-06-19 NOTE — Assessment & Plan Note (Signed)
Has planned f/u in 06/2018.  She was questioning if some of her symptoms could be related to evista.  Discussed may aggravate hot flashes.  Do not feel evista is contributing to the increased anxiety, etc.  D/w Dr Preston Fleeting.

## 2018-06-19 NOTE — Assessment & Plan Note (Signed)
CPAP.  

## 2018-06-19 NOTE — Assessment & Plan Note (Signed)
Blood pressure elevated.  Just increased diovan to 160mg  q day.  On recheck improved.  Hold on further changes in blood pressure medication at this time.  Follow.

## 2018-06-19 NOTE — Assessment & Plan Note (Signed)
Increased stress and anxiety as outlined.  Feel is multifactorial.  Increased stress related to her upcoming appt regarding f/u on her breast.  Her son has been out of the country.  Taking care of family members, etc.  On cymbalta for pain.  Will add buspar 5mg  bid.  Pt preferred to start low dose.  Discussed with Dr Nicolasa Ducking.  Will arrange f/u with her.  No suicidal ideations.  States she would not hurt herself and would not hurt anyone else.

## 2018-06-19 NOTE — Assessment & Plan Note (Signed)
On thyroid replacement.  Follow tsh.  Recent tsh wnl.

## 2018-06-20 ENCOUNTER — Telehealth: Payer: Self-pay | Admitting: Internal Medicine

## 2018-06-20 NOTE — Telephone Encounter (Signed)
-----   Message from Robert Bellow, MD sent at 06/19/2018  8:47 PM EDT ----- Regarding: RE: update Agree with your assessment, hot flashes for sure, anxiety??? Jennifer Pratt was chosen over tamoxifen as she was on Cymbalta which accelerates tamoxifen metabolism.  Could consider an aromatase inhibitor in place of Jennifer Pratt. Still with possibility of vasomotor symptoms ----- Message ----- From: Einar Pheasant, MD Sent: 06/19/2018   5:54 AM To: Robert Bellow, MD Subject: update                                         I saw Jennifer Pratt yesterday as a work in.  She is having increased anxiety and hot flashes, etc.  She was questioning if the Jennifer Pratt is contributing to her symptoms.  I explained to her that I did not feel the Jennifer Pratt was contributing to her increased anxiety,etc.  Possibly could be contributing to some increased hot flashes.  I have added buspar to her medication regimen and will get her established with Dr Nicolasa Ducking. She wanted you to be aware of the above and had questions about the Jennifer Pratt.  She is willing to remain on the medication if you feel is best.  She has a follow up with you in 06/2018.  Let me know if you need her to do anything prior to her appt.    Thanks for your help. Einar Pheasant

## 2018-06-20 NOTE — Telephone Encounter (Signed)
My chart message sent to pt.

## 2018-07-02 ENCOUNTER — Other Ambulatory Visit: Payer: Self-pay | Admitting: Internal Medicine

## 2018-07-03 ENCOUNTER — Other Ambulatory Visit: Payer: Self-pay | Admitting: Internal Medicine

## 2018-07-04 ENCOUNTER — Ambulatory Visit
Admission: RE | Admit: 2018-07-04 | Discharge: 2018-07-04 | Disposition: A | Discharge status: Home / Self Care | Payer: BLUE CROSS/BLUE SHIELD | Source: Ambulatory Visit | Attending: General Surgery | Admitting: General Surgery

## 2018-07-04 DIAGNOSIS — R928 Other abnormal and inconclusive findings on diagnostic imaging of breast: Secondary | ICD-10-CM | POA: Diagnosis not present

## 2018-07-04 DIAGNOSIS — D0511 Intraductal carcinoma in situ of right breast: Secondary | ICD-10-CM | POA: Insufficient documentation

## 2018-07-04 DIAGNOSIS — Z853 Personal history of malignant neoplasm of breast: Secondary | ICD-10-CM | POA: Diagnosis not present

## 2018-07-10 ENCOUNTER — Ambulatory Visit: Payer: BLUE CROSS/BLUE SHIELD | Admitting: General Surgery

## 2018-07-10 ENCOUNTER — Encounter: Payer: Self-pay | Admitting: General Surgery

## 2018-07-10 VITALS — BP 140/89 | HR 90 | Resp 15 | Ht 68.0 in | Wt 231.0 lb

## 2018-07-10 DIAGNOSIS — D0511 Intraductal carcinoma in situ of right breast: Secondary | ICD-10-CM | POA: Diagnosis not present

## 2018-07-10 NOTE — Progress Notes (Signed)
Patient ID: Jennifer Pratt, female   DOB: Mar 14, 1961, 57 y.o.   MRN: [id number]  Chief Complaint  Patient presents with  . Follow-up    HPI Jennifer Pratt is a 57 y.o. female.  who presents for a follow up right breast cancer and breast evaluation. The most recent mammogram was done on 07-04-18.  Patient does perform regular self breast checks and gets regular mammograms done.   She did notice some hot flashes and mood swings with the Evista, so she is now on Buspar, which has helped.  HPI  Past Medical History:  Diagnosis Date  . Arthritis   . 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  . Hypercholesterolemia   . Hypertension   . Hypothyroidism   . 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 Sleep apnea    USES CPAP  . Ulcer    Gastric    Past Surgical History:  Procedure Laterality Date  . BREAST BIOPSY Right 06/28/2017   Stereo affirm- UOQ/DUCTAL CARCINOMA IN SITU, HIGH NUCLEAR GRADE WITH COMEDONECROSIS   . BREAST EXCISIONAL BIOPSY Right 07/17/2017   WIde excision upper outer quadrant DCIS  Dr. Bary Castilla  . BREAST LUMPECTOMY Right 07/17/2017   DCIS mammosite  . CESAREAN SECTION    . COLONOSCOPY N/A 10/05/2016   Procedure: COLONOSCOPY;  Surgeon: Manya Silvas, MD;  Location: [facility] ENDOSCOPY;  Service: Endoscopy;  Laterality: N/A;  Zosyn IVPB  . JOINT REPLACEMENT    . MASTECTOMY, PARTIAL Right 07/17/2017   Procedure: MASTECTOMY PARTIAL;  Surgeon: Robert Bellow, MD;  Location: [facility] ORS;  Service: General;  Laterality: Right;  . thumb surgery Right 2005   AND CTR  . THYROIDECTOMY  1988  . TOTAL KNEE ARTHROPLASTY Left 2012    Family History  Problem Relation Age of Onset  . Diabetes Father   . Dementia Mother   . Breast cancer Neg Hx   . Colon cancer Neg Hx     Social History Social History   Tobacco Use  . Smoking status: Former Smoker    Packs/day: 0.25    Years: 2.00    Pack years: 0.50   Types: Cigarettes    Last attempt to quit: 08/01/2017    Years since quitting: 0.9  . Smokeless tobacco: Never Used  . Tobacco comment: PT ONLY SMOKES SOCIALLY-SHE MAY GO A FEW MONTHS WITHOUT EVER SMOKING   Substance Use Topics  . Alcohol use: Yes    Alcohol/week: 0.0 standard drinks    Comment: Socially  . Drug use: No    Allergies  Allergen Reactions  . Lisinopril Other (See Comments)    UNKNOWN     Current Outpatient Medications  Medication Sig Dispense Refill  . busPIRone (BUSPAR) 5 MG tablet TAKE 1 TABLET BY MOUTH TWICE A DAY 30 tablet 3  . carbamazepine (CARBATROL) 200 MG 12 hr capsule TAKE 1 CAPSULE (200 MG TOTAL) BY MOUTH 2 (TWO) TIMES DAILY. 180 capsule 3  . diclofenac (VOLTAREN) 75 MG EC tablet Take 1 tablet (75 mg total) by mouth 2 (two) times daily as needed. (Patient taking differently: Take 75 mg by mouth 2 (two) times daily. ) 60 tablet 0  . DULoxetine (CYMBALTA) 60 MG capsule TAKE 1 CAPSULE BY MOUTH EVERY DAY 90 capsule 1  . fluticasone (FLONASE) 50 MCG/ACT nasal spray Place 1 spray into both nostrils daily as needed for allergies or rhinitis.    Marland Kitchen  levothyroxine (SYNTHROID, LEVOTHROID) 175 MCG tablet TAKE 1 TABLET BY MOUTH EVERY DAY 90 tablet 0  . pravastatin (PRAVACHOL) 20 MG tablet Take 1 tablet (20 mg total) by mouth daily. 90 tablet 3  . raloxifene (EVISTA) 60 MG tablet Take 1 tablet (60 mg total) by mouth daily. 30 tablet 12  . valsartan (DIOVAN) 160 MG tablet Take 1 tablet (160 mg total) by mouth daily. 30 tablet 2   No current facility-administered medications for this visit.     Review of Systems Review of Systems  Constitutional: Negative.   Respiratory: Negative.   Cardiovascular: Negative.     Blood pressure 140/89, pulse 90, resp. rate 15, height 5\' 8"  (1.727 m), weight 231 lb (104.8 kg), last menstrual period 10/09/2012, SpO2 96 %.  Physical Exam Physical Exam  Constitutional: She is oriented to person, place, and time. She appears well-developed  and well-nourished.  HENT:  Mouth/Throat: Oropharynx is clear and moist.  Eyes: Conjunctivae are normal. No scleral icterus.  Neck: Neck supple.  Cardiovascular: Normal rate, regular rhythm and normal heart sounds.  Pulmonary/Chest: Effort normal and breath sounds normal. Right breast exhibits no inverted nipple, no mass, no nipple discharge, no skin change and no tenderness. Left breast exhibits no inverted nipple, no mass, no nipple discharge, no skin change and no tenderness.  Right breast lumpectomy site well healed with thickening.     Lymphadenopathy:    She has no cervical adenopathy.    She has no axillary adenopathy.  Neurological: She is alert and oriented to person, place, and time.  Skin: Skin is warm and dry.  Psychiatric: Her behavior is normal.    Data Reviewed July 04, 2018 bilateral diagnostic mammograms reviewed.  Postbiopsy changes.  BI-RADS-2.  Assessment    Doing well post wide excision, accelerated partial breast radiation for right breast DCIS.    Plan    The patient has been asked to return to the office in one year with a bilateral diagnostic mammogram. The patient is aware to call back for any questions or new concerns.      HPI, Physical Exam, Assessment and Plan have been scribed under the direction and in the presence of Robert Bellow, MD. Karie Fetch, RN  I have completed the exam and reviewed the above documentation for accuracy and completeness.  I agree with the above.  Haematologist has been used and any errors in dictation or transcription are unintentional.  Hervey Ard, M.D., F.A.C.S.  Forest Gleason Tahari Clabaugh 07/11/2018, 5:22 PM

## 2018-07-10 NOTE — Patient Instructions (Addendum)
The patient has been asked to return to the office in one year with a bilateral diagnostic mammogram. The patient is aware to call back for any questions or new concerns.  

## 2018-07-12 DIAGNOSIS — M7711 Lateral epicondylitis, right elbow: Secondary | ICD-10-CM | POA: Diagnosis not present

## 2018-08-13 ENCOUNTER — Encounter: Payer: Self-pay | Admitting: Internal Medicine

## 2018-08-13 ENCOUNTER — Ambulatory Visit: Payer: BLUE CROSS/BLUE SHIELD | Admitting: Internal Medicine

## 2018-08-13 VITALS — BP 142/88 | HR 85 | Temp 99.3°F | Resp 18 | Wt 234.8 lb

## 2018-08-13 DIAGNOSIS — G4733 Obstructive sleep apnea (adult) (pediatric): Secondary | ICD-10-CM

## 2018-08-13 DIAGNOSIS — I10 Essential (primary) hypertension: Secondary | ICD-10-CM | POA: Diagnosis not present

## 2018-08-13 DIAGNOSIS — F439 Reaction to severe stress, unspecified: Secondary | ICD-10-CM

## 2018-08-13 DIAGNOSIS — Z23 Encounter for immunization: Secondary | ICD-10-CM | POA: Diagnosis not present

## 2018-08-13 DIAGNOSIS — D0511 Intraductal carcinoma in situ of right breast: Secondary | ICD-10-CM

## 2018-08-13 DIAGNOSIS — E7800 Pure hypercholesterolemia, unspecified: Secondary | ICD-10-CM | POA: Diagnosis not present

## 2018-08-13 DIAGNOSIS — F419 Anxiety disorder, unspecified: Secondary | ICD-10-CM | POA: Diagnosis not present

## 2018-08-13 NOTE — Progress Notes (Signed)
Patient ID: Jennifer Pratt, female   DOB: [birth year], 57 y.o.   MRN: [id number]   Subjective:    Patient ID: Jennifer Pratt, female    DOB: Jun 05, 1961, 57 y.o.   MRN: [id number]  HPI  Patient here for a scheduled follow up.  Increased stress and anxiety last visit.  Was started on buspar last visit.  States is doing better.  Handling stress better.  Does not feel needs anything Pratt at this time.  Trying to stay active.  No chest pain.  No sob.  No acid reflux.  No abdominal pain.  Bowels moving.  Saw Dr Bary Castilla.  Mammogram ok.  Recommended f/u in one year.     Past Medical History:  Diagnosis Date  . Arthritis   . 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  . Hypercholesterolemia   . Hypertension   . Hypothyroidism   . 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 Sleep apnea    USES CPAP  . Ulcer    Gastric   Past Surgical History:  Procedure Laterality Date  . BREAST BIOPSY Right 06/28/2017   Stereo affirm- UOQ/DUCTAL CARCINOMA IN SITU, HIGH NUCLEAR GRADE WITH COMEDONECROSIS   . BREAST EXCISIONAL BIOPSY Right 07/17/2017   WIde excision upper outer quadrant DCIS  Dr. Bary Castilla  . BREAST LUMPECTOMY Right 07/17/2017   DCIS mammosite  . CESAREAN SECTION    . COLONOSCOPY N/A 10/05/2016   Procedure: COLONOSCOPY;  Surgeon: Manya Silvas, MD;  Location: [facility] ENDOSCOPY;  Service: Endoscopy;  Laterality: N/A;  Zosyn IVPB  . JOINT REPLACEMENT    . MASTECTOMY, PARTIAL Right 07/17/2017   Procedure: MASTECTOMY PARTIAL;  Surgeon: Robert Bellow, MD;  Location: [facility] ORS;  Service: General;  Laterality: Right;  . thumb surgery Right 2005   AND CTR  . THYROIDECTOMY  1988  . TOTAL KNEE ARTHROPLASTY Left 2012   Family History  Problem Relation Age of Onset  . Diabetes Father   . Dementia Mother   . Breast cancer Neg Hx   . Colon cancer Neg Hx    Social History   Socioeconomic History  . Marital status: Married    Spouse  name: Not on file  . Number of children: 1  . Years of education: Not on file  . Highest education level: Not on file  Occupational History    Employer: [employer]  Social Needs  . Financial resource strain: Not on file  . Food insecurity:    Worry: Not on file    Inability: Not on file  . Transportation needs:    Medical: Not on file    Non-medical: Not on file  Tobacco Use  . Smoking status: Former Smoker    Packs/day: 0.25    Years: 2.00    Pack years: 0.50    Types: Cigarettes    Last attempt to quit: 08/01/2017    Years since quitting: 1.0  . Smokeless tobacco: Never Used  . Tobacco comment: PT ONLY SMOKES SOCIALLY-SHE MAY GO A FEW MONTHS WITHOUT EVER SMOKING   Substance and Sexual Activity  . Alcohol use: Yes    Alcohol/week: 0.0 standard drinks    Comment: Socially  . Drug use: No  . Sexual activity: Yes  Lifestyle  . Physical activity:    Days per week: Not on file    Minutes per session: Not on file  . Stress: Not on file  Relationships  . Social connections:    Talks on phone: Not on file    Gets together: Not on file    Attends religious service: Not on file    Active member of club or organization: Not on file    Attends meetings of clubs or organizations: Not on file    Relationship status: Not on file  Other Topics Concern  . Not on file  Social History Narrative  . Not on file    Outpatient Encounter Medications as of 08/13/2018  Medication Sig  . busPIRone (BUSPAR) 5 MG tablet TAKE 1 TABLET BY MOUTH TWICE A DAY  . carbamazepine (CARBATROL) 200 MG 12 hr capsule TAKE 1 CAPSULE (200 MG TOTAL) BY MOUTH 2 (TWO) TIMES DAILY.  Marland Kitchen diclofenac (VOLTAREN) 75 MG EC tablet Take 1 tablet (75 mg total) by mouth 2 (two) times daily as needed. (Patient taking differently: Take 75 mg by mouth 2 (two) times daily. )  . DULoxetine (CYMBALTA) 60 MG capsule TAKE 1 CAPSULE BY MOUTH EVERY DAY  . fluticasone (FLONASE) 50 MCG/ACT nasal spray Place 1 spray into both  nostrils daily as needed for allergies or rhinitis.  Marland Kitchen levothyroxine (SYNTHROID, LEVOTHROID) 175 MCG tablet TAKE 1 TABLET BY MOUTH EVERY DAY  . pravastatin (PRAVACHOL) 20 MG tablet Take 1 tablet (20 mg total) by mouth daily.  . raloxifene (EVISTA) 60 MG tablet Take 1 tablet (60 mg total) by mouth daily.  . valsartan (DIOVAN) 160 MG tablet Take 1 tablet (160 mg total) by mouth daily.  . [DISCONTINUED] KLOR-CON 10 10 MEQ tablet TAKE 1 TABLET (10 MEQ TOTAL) BY MOUTH 2 (TWO) TIMES DAILY.   No facility-administered encounter medications on file as of 08/13/2018.     Review of Systems  Constitutional: Negative for appetite change and unexpected weight change.  HENT: Negative for congestion and sinus pressure.   Respiratory: Negative for cough, chest tightness and shortness of breath.   Cardiovascular: Negative for chest pain, palpitations and leg swelling.  Gastrointestinal: Negative for abdominal pain, diarrhea, nausea and vomiting.  Genitourinary: Negative for difficulty urinating and dysuria.  Musculoskeletal: Negative for joint swelling and myalgias.  Skin: Negative for color change and rash.  Neurological: Negative for dizziness, light-headedness and headaches.  Psychiatric/Behavioral: Negative for agitation and dysphoric mood.       Objective:    Physical Exam  Constitutional: She appears well-developed and well-nourished. No distress.  HENT:  Nose: Nose normal.  Mouth/Throat: Oropharynx is clear and moist.  Neck: Neck supple. No thyromegaly present.  Cardiovascular: Normal rate and regular rhythm.  Pulmonary/Chest: Breath sounds normal. No respiratory distress. She has no wheezes.  Abdominal: Soft. Bowel sounds are normal. There is no tenderness.  Musculoskeletal: She exhibits no edema or tenderness.  Lymphadenopathy:    She has no cervical adenopathy.  Skin: No rash noted. No erythema.  Psychiatric: She has a normal mood and affect. Her behavior is normal.    BP (!) 142/88  (BP Location: Left Arm, Patient Position: Sitting, Cuff Size: Normal)   Pulse 85   Temp 99.3 F (37.4 C) (Oral)   Resp 18   Wt 234 lb 12.8 oz (106.5 kg)   LMP 10/09/2012   SpO2 98%   BMI 35.70 kg/m  Wt Readings from Last 3 Encounters:  08/13/18 234 lb 12.8 oz (106.5 kg)  07/10/18 231 lb (104.8 kg)  06/18/18 233 lb 3.2 oz (105.8 kg)     Lab Results  Component Value Date   WBC 5.0 06/08/2018  HGB 13.1 06/08/2018   HCT 38.1 06/08/2018   PLT 228.0 06/08/2018   GLUCOSE 103 (H) 06/08/2018   CHOL 206 (H) 06/08/2018   TRIG 149.0 06/08/2018   HDL 63.30 06/08/2018   LDLDIRECT 112.0 06/04/2015   LDLCALC 112 (H) 06/08/2018   ALT 24 06/08/2018   AST 18 06/08/2018   NA 142 06/08/2018   K 3.6 06/08/2018   CL 104 06/08/2018   CREATININE 0.90 06/08/2018   BUN 22 06/08/2018   CO2 28 06/08/2018   TSH 1.17 06/08/2018   INR 1.41 08/12/2011   HGBA1C 5.1 09/13/2016    Mm Diag Breast Tomo Bilateral  Result Date: 07/04/2018 CLINICAL DATA:  Right lumpectomy.  Annual mammography. EXAM: DIGITAL DIAGNOSTIC BILATERAL MAMMOGRAM WITH CAD AND TOMO COMPARISON:  Previous exam(s). ACR Breast Density Category b: There are scattered areas of fibroglandular density. FINDINGS: The right lumpectomy site is stable.  No evidence of malignancy. Mammographic images were processed with CAD. IMPRESSION: No evidence of malignancy. RECOMMENDATION: Annual diagnostic mammography. I have discussed the findings and recommendations with the patient. Results were also provided in writing at the conclusion of the visit. If applicable, a reminder letter will be sent to the patient regarding the next appointment. BI-RADS CATEGORY  2: Benign. Electronically Signed   By: Dorise Bullion III M.D   On: 07/04/2018 14:35       Assessment & Plan:   Problem List Items Addressed This Visit    Anxiety    On buspar.  Doing better. Discussed with her today.  Does not feel needs any further intervention.  Follow.        Ductal  carcinoma in situ (DCIS) of right breast    Mammogram 07/04/18 - birads II.  Followed by Dr Bary Castilla.        Essential hypertension, benign    Blood pressure slightly elevated.  Hold on making changes in her medication.  Have her spot check her pressures.        Relevant Orders   Basic metabolic panel   Hypercholesterolemia    On pravastatin.  Low cholesterol diet and exercise.  Follow lipid panel and liver function tests.        Relevant Orders   Hepatic function panel   Lipid panel   Obstructive sleep apnea    CPAP.       Stress    On cymbalta.  Added buspar.  Doing better.  Follow.        Other Visit Diagnoses    Need for influenza vaccination    -  Primary   Relevant Orders   Flu Vaccine QUAD 6+ mos PF IM (Fluarix Quad PF) (Completed)       Einar Pheasant, MD

## 2018-08-18 ENCOUNTER — Encounter: Payer: Self-pay | Admitting: Internal Medicine

## 2018-08-18 NOTE — Assessment & Plan Note (Signed)
Blood pressure slightly elevated.  Hold on making changes in her medication.  Have her spot check her pressures.

## 2018-08-18 NOTE — Assessment & Plan Note (Signed)
CPAP.  

## 2018-08-18 NOTE — Assessment & Plan Note (Signed)
On pravastatin.  Low cholesterol diet and exercise.  Follow lipid panel and liver function tests.   

## 2018-08-18 NOTE — Assessment & Plan Note (Signed)
On buspar.  Doing better. Discussed with her today.  Does not feel needs any further intervention.  Follow.

## 2018-08-18 NOTE — Assessment & Plan Note (Signed)
On cymbalta.  Added buspar.  Doing better.  Follow.

## 2018-08-18 NOTE — Assessment & Plan Note (Signed)
Mammogram 07/04/18 - birads II.  Followed by Dr Bary Castilla.

## 2018-08-23 DIAGNOSIS — F4011 Social phobia, generalized: Secondary | ICD-10-CM | POA: Diagnosis not present

## 2018-08-23 DIAGNOSIS — F330 Major depressive disorder, recurrent, mild: Secondary | ICD-10-CM | POA: Diagnosis not present

## 2018-08-28 ENCOUNTER — Other Ambulatory Visit: Payer: Self-pay | Admitting: Internal Medicine

## 2018-09-05 ENCOUNTER — Other Ambulatory Visit: Payer: Self-pay | Admitting: Internal Medicine

## 2018-09-06 DIAGNOSIS — F330 Major depressive disorder, recurrent, mild: Secondary | ICD-10-CM | POA: Diagnosis not present

## 2018-09-06 DIAGNOSIS — F4011 Social phobia, generalized: Secondary | ICD-10-CM | POA: Diagnosis not present

## 2018-09-12 ENCOUNTER — Other Ambulatory Visit: Payer: Self-pay

## 2018-09-12 ENCOUNTER — Ambulatory Visit
Admission: RE | Admit: 2018-09-12 | Discharge: 2018-09-12 | Disposition: A | Discharge status: Home / Self Care | Payer: BLUE CROSS/BLUE SHIELD | Source: Ambulatory Visit | Attending: Radiation Oncology | Admitting: Radiation Oncology

## 2018-09-12 ENCOUNTER — Encounter: Payer: Self-pay | Admitting: Radiation Oncology

## 2018-09-12 VITALS — BP 158/98 | HR 77 | Temp 98.0°F | Resp 18 | Wt 236.4 lb

## 2018-09-12 DIAGNOSIS — Z79811 Long term (current) use of aromatase inhibitors: Secondary | ICD-10-CM | POA: Diagnosis not present

## 2018-09-12 DIAGNOSIS — R232 Flushing: Secondary | ICD-10-CM | POA: Diagnosis not present

## 2018-09-12 DIAGNOSIS — Z170 Estrogen receptor positive status [ER+]: Secondary | ICD-10-CM | POA: Insufficient documentation

## 2018-09-12 DIAGNOSIS — D0511 Intraductal carcinoma in situ of right breast: Secondary | ICD-10-CM | POA: Diagnosis not present

## 2018-09-12 DIAGNOSIS — Z923 Personal history of irradiation: Secondary | ICD-10-CM | POA: Insufficient documentation

## 2018-09-12 NOTE — Progress Notes (Signed)
Radiation Oncology Follow up Note  Name: Jennifer Pratt   Date:   09/12/2018 MRN:  [id number] DOB: 12/26/1960    This 57 y.o. female presents to the clinic today for 1 year follow-up status post accelerated partial breast radiation to her right breast for ductal carcinoma in situ ER/PR positive.  REFERRING PROVIDER: Einar Pheasant, MD  HPI: patient is a 57 year old female now 1 year out having completed accelerated partial breast irradiation.she is seen today in routine follow-up and is doing well. She specifically denies breast tenderness cough or bone pain. She had a mammogram in August showing BI-RADS 2 benign disease which I have reviewed. She is currently onEvista tolerated well although she is having some hot flashes. I have suggested vitamin E for that.  COMPLICATIONS OF TREATMENT: none  FOLLOW UP COMPLIANCE: keeps appointments   PHYSICAL EXAM:  BP (!) 158/98 (BP Location: Left Arm, Patient Position: Sitting)   Pulse 77   Temp 98 F (36.7 C) (Tympanic)   Resp 18   Wt 236 lb 7.1 oz (107.2 kg)   LMP 10/09/2012   BMI 35.95 kg/m  Lungs are clear to A&P cardiac examination essentially unremarkable with regular rate and rhythm. No dominant mass or nodularity is noted in either breast in 2 positions examined. Incision is well-healed. No axillary or supraclavicular adenopathy is appreciated. Cosmetic result is excellent.Well-developed well-nourished patient in NAD. HEENT reveals PERLA, EOMI, discs not visualized.  Oral cavity is clear. No oral mucosal lesions are identified. Neck is clear without evidence of cervical or supraclavicular adenopathy. Lungs are clear to A&P. Cardiac examination is essentially unremarkable with regular rate and rhythm without murmur rub or thrill. Abdomen is benign with no organomegaly or masses noted. Motor sensory and DTR levels are equal and symmetric in the upper and lower extremities. Cranial nerves II through XII are grossly intact. Proprioception is  intact. No peripheral adenopathy or edema is identified. No motor or sensory levels are noted. Crude visual fields are within normal range.  RADIOLOGY RESULTS: mammograms are reviewed and compatible with the above-stated findings  PLAN: the present time she is doing well with no evidence of disease. She continues on Evista. I have suggested vitamin E supplements for her hot flashes. I'm otherwise please were overall progress. I've asked to see her back in 1 year for follow-up. She knows to call sooner with any concerns.  I would like to take this opportunity to thank you for allowing me to participate in the care of your patient.Noreene Filbert, MD

## 2018-09-13 ENCOUNTER — Other Ambulatory Visit: Payer: Self-pay | Admitting: Internal Medicine

## 2018-10-03 DIAGNOSIS — F4011 Social phobia, generalized: Secondary | ICD-10-CM | POA: Diagnosis not present

## 2018-10-03 DIAGNOSIS — F330 Major depressive disorder, recurrent, mild: Secondary | ICD-10-CM | POA: Diagnosis not present

## 2018-10-04 ENCOUNTER — Other Ambulatory Visit (INDEPENDENT_AMBULATORY_CARE_PROVIDER_SITE_OTHER): Payer: BLUE CROSS/BLUE SHIELD

## 2018-10-04 DIAGNOSIS — I10 Essential (primary) hypertension: Secondary | ICD-10-CM | POA: Diagnosis not present

## 2018-10-04 DIAGNOSIS — E7800 Pure hypercholesterolemia, unspecified: Secondary | ICD-10-CM | POA: Diagnosis not present

## 2018-10-04 LAB — LIPID PANEL
Cholesterol: 195 mg/dL (ref 0–200)
HDL: 57.1 mg/dL (ref >39.00)
LDL Cholesterol: 109 mg/dL — ABNORMAL HIGH (ref 0–99)
NonHDL: 137.8
Total CHOL/HDL Ratio: 3
Triglycerides: 143 mg/dL (ref 0.0–149.0)
VLDL: 28.6 mg/dL (ref 0.0–40.0)

## 2018-10-04 LAB — BASIC METABOLIC PANEL
BUN: 23 mg/dL (ref 6–23)
CALCIUM: 9.1 mg/dL (ref 8.4–10.5)
CO2: 30 mEq/L (ref 19–32)
Chloride: 104 mEq/L (ref 96–112)
Creatinine, Ser: 0.94 mg/dL (ref 0.40–1.20)
GFR: 65.31 mL/min (ref >60.00)
GLUCOSE: 119 mg/dL — AB (ref 70–99)
Potassium: 3.9 mEq/L (ref 3.5–5.1)
SODIUM: 141 meq/L (ref 135–145)

## 2018-10-04 LAB — HEPATIC FUNCTION PANEL
ALK PHOS: 53 U/L (ref 39–117)
ALT: 16 U/L (ref 0–35)
AST: 14 U/L (ref 0–37)
Albumin: 4.5 g/dL (ref 3.5–5.2)
BILIRUBIN TOTAL: 0.5 mg/dL (ref 0.2–1.2)
Bilirubin, Direct: 0.1 mg/dL (ref 0.0–0.3)
Total Protein: 6.9 g/dL (ref 6.0–8.3)

## 2018-10-05 ENCOUNTER — Encounter: Payer: Self-pay | Admitting: Internal Medicine

## 2018-10-06 ENCOUNTER — Other Ambulatory Visit: Payer: Self-pay | Admitting: General Surgery

## 2018-10-09 ENCOUNTER — Ambulatory Visit: Payer: BLUE CROSS/BLUE SHIELD | Admitting: Internal Medicine

## 2018-10-09 ENCOUNTER — Encounter: Payer: Self-pay | Admitting: Internal Medicine

## 2018-10-09 VITALS — BP 138/78 | HR 85 | Temp 98.8°F | Resp 18 | Wt 236.2 lb

## 2018-10-09 DIAGNOSIS — E7800 Pure hypercholesterolemia, unspecified: Secondary | ICD-10-CM

## 2018-10-09 DIAGNOSIS — F419 Anxiety disorder, unspecified: Secondary | ICD-10-CM

## 2018-10-09 DIAGNOSIS — G40309 Generalized idiopathic epilepsy and epileptic syndromes, not intractable, without status epilepticus: Secondary | ICD-10-CM

## 2018-10-09 DIAGNOSIS — D0511 Intraductal carcinoma in situ of right breast: Secondary | ICD-10-CM | POA: Diagnosis not present

## 2018-10-09 DIAGNOSIS — I10 Essential (primary) hypertension: Secondary | ICD-10-CM

## 2018-10-09 DIAGNOSIS — E039 Hypothyroidism, unspecified: Secondary | ICD-10-CM

## 2018-10-09 DIAGNOSIS — G4733 Obstructive sleep apnea (adult) (pediatric): Secondary | ICD-10-CM

## 2018-10-09 DIAGNOSIS — R739 Hyperglycemia, unspecified: Secondary | ICD-10-CM | POA: Diagnosis not present

## 2018-10-09 MED ORDER — AMLODIPINE BESYLATE 2.5 MG PO TABS: 2.5000 mg | ORAL_TABLET | Freq: Every day | ORAL | 2 refills | Status: DC

## 2018-10-09 NOTE — Progress Notes (Signed)
Patient ID: Jennifer Pratt, female   DOB: October 17, 1961, 57 y.o.   MRN: [id number]   Subjective:    Patient ID: Jennifer Pratt, female    DOB: 08-Dec-1960, 57 y.o.   MRN: [id number]  HPI  Patient here for a scheduled follow up.  She has had problems with increased anxiety recently.  Seeing Dr Nicolasa Ducking.  zoloft added. buspar stopped.  States she feels better.  Trying to stay active.  No chest pain.  No sob.  No acid reflux.  No abdominal pain.  Bowels moving.  No urine change.     Past Medical History:  Diagnosis Date  . Arthritis   . 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  . Hypercholesterolemia   . Hypertension   . Hypothyroidism   . 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 Sleep apnea    USES CPAP  . Ulcer    Gastric   Past Surgical History:  Procedure Laterality Date  . BREAST BIOPSY Right 06/28/2017   Stereo affirm- UOQ/DUCTAL CARCINOMA IN SITU, HIGH NUCLEAR GRADE WITH COMEDONECROSIS   . BREAST EXCISIONAL BIOPSY Right 07/17/2017   WIde excision upper outer quadrant DCIS  Dr. Bary Castilla  . BREAST LUMPECTOMY Right 07/17/2017   DCIS mammosite  . CESAREAN SECTION    . COLONOSCOPY N/A 10/05/2016   Procedure: COLONOSCOPY;  Surgeon: Manya Silvas, MD;  Location: [facility] ENDOSCOPY;  Service: Endoscopy;  Laterality: N/A;  Zosyn IVPB  . JOINT REPLACEMENT    . MASTECTOMY, PARTIAL Right 07/17/2017   Procedure: MASTECTOMY PARTIAL;  Surgeon: Robert Bellow, MD;  Location: [facility] ORS;  Service: General;  Laterality: Right;  . thumb surgery Right 2005   AND CTR  . THYROIDECTOMY  1988  . TOTAL KNEE ARTHROPLASTY Left 2012   Family History  Problem Relation Age of Onset  . Diabetes Father   . Dementia Mother   . Breast cancer Neg Hx   . Colon cancer Neg Hx    Social History   Socioeconomic History  . Marital status: Married    Spouse name: Not on file  . Number of children: 1  . Years of education: Not on file  .  Highest education level: Not on file  Occupational History    Employer: [employer]  Social Needs  . Financial resource strain: Not on file  . Food insecurity:    Worry: Not on file    Inability: Not on file  . Transportation needs:    Medical: Not on file    Non-medical: Not on file  Tobacco Use  . Smoking status: Former Smoker    Packs/day: 0.25    Years: 2.00    Pack years: 0.50    Types: Cigarettes    Last attempt to quit: 08/01/2017    Years since quitting: 1.2  . Smokeless tobacco: Never Used  . Tobacco comment: PT ONLY SMOKES SOCIALLY-SHE MAY GO A FEW MONTHS WITHOUT EVER SMOKING   Substance and Sexual Activity  . Alcohol use: Yes    Alcohol/week: 0.0 standard drinks    Comment: Socially  . Drug use: No  . Sexual activity: Yes  Lifestyle  . Physical activity:    Days per week: Not on file    Minutes per session: Not on file  . Stress: Not on file  Relationships  . Social connections:    Talks on phone: Not on file    Gets  together: Not on file    Attends religious service: Not on file    Active member of club or organization: Not on file    Attends meetings of clubs or organizations: Not on file    Relationship status: Not on file  Other Topics Concern  . Not on file  Social History Narrative  . Not on file    Outpatient Encounter Medications as of 10/09/2018  Medication Sig  . amLODipine (NORVASC) 2.5 MG tablet Take 1 tablet (2.5 mg total) by mouth daily.  . carbamazepine (CARBATROL) 200 MG 12 hr capsule TAKE 1 CAPSULE (200 MG TOTAL) BY MOUTH 2 (TWO) TIMES DAILY.  Marland Kitchen diclofenac (VOLTAREN) 75 MG EC tablet Take 1 tablet (75 mg total) by mouth 2 (two) times daily as needed. (Patient taking differently: Take 75 mg by mouth 2 (two) times daily. )  . DULoxetine (CYMBALTA) 60 MG capsule TAKE 1 CAPSULE BY MOUTH EVERY DAY  . fluticasone (FLONASE) 50 MCG/ACT nasal spray Place 1 spray into both nostrils daily as needed for allergies or rhinitis.  Marland Kitchen levothyroxine  (SYNTHROID, LEVOTHROID) 175 MCG tablet TAKE 1 TABLET BY MOUTH EVERY DAY  . pravastatin (PRAVACHOL) 20 MG tablet Take 1 tablet (20 mg total) by mouth daily.  . raloxifene (EVISTA) 60 MG tablet TAKE 1 TABLET BY MOUTH EVERY DAY  . sertraline (ZOLOFT) 50 MG tablet Take 50 mg by mouth daily.  . valsartan (DIOVAN) 160 MG tablet TAKE 1 TABLET BY MOUTH EVERY DAY  . [DISCONTINUED] busPIRone (BUSPAR) 5 MG tablet TAKE 1 TABLET BY MOUTH TWICE A DAY  . [DISCONTINUED] KLOR-CON 10 10 MEQ tablet TAKE 1 TABLET (10 MEQ TOTAL) BY MOUTH 2 (TWO) TIMES DAILY.   No facility-administered encounter medications on file as of 10/09/2018.     Review of Systems  Constitutional: Negative for appetite change and unexpected weight change.  HENT: Negative for congestion and sinus pressure.   Respiratory: Negative for cough, chest tightness and shortness of breath.   Cardiovascular: Negative for chest pain, palpitations and leg swelling.  Gastrointestinal: Negative for abdominal pain, diarrhea, nausea and vomiting.  Genitourinary: Negative for difficulty urinating and dysuria.  Musculoskeletal: Negative for joint swelling and myalgias.  Skin: Negative for color change and rash.  Neurological: Negative for dizziness, light-headedness and headaches.  Psychiatric/Behavioral: Negative for agitation and dysphoric mood.       Seeing Dr Nicolasa Ducking.  Doing better on zoloft.         Objective:    Physical Exam  Constitutional: She appears well-developed and well-nourished. No distress.  HENT:  Nose: Nose normal.  Mouth/Throat: Oropharynx is clear and moist.  Neck: Neck supple. No thyromegaly present.  Cardiovascular: Normal rate and regular rhythm.  Pulmonary/Chest: Breath sounds normal. No respiratory distress. She has no wheezes.  Abdominal: Soft. Bowel sounds are normal. There is no tenderness.  Musculoskeletal: She exhibits no edema or tenderness.  Lymphadenopathy:    She has no cervical adenopathy.  Skin: No rash  noted. No erythema.  Psychiatric: She has a normal mood and affect. Her behavior is normal.    BP 138/78 (BP Location: Left Arm, Patient Position: Sitting, Cuff Size: Large)   Pulse 85   Temp 98.8 F (37.1 C) (Oral)   Resp 18   Wt 236 lb 3.2 oz (107.1 kg)   LMP 10/09/2012   SpO2 97%   BMI 35.91 kg/m  Wt Readings from Last 3 Encounters:  10/09/18 236 lb 3.2 oz (107.1 kg)  09/12/18 236 lb 7.1 oz (  107.2 kg)  08/13/18 234 lb 12.8 oz (106.5 kg)     Lab Results  Component Value Date   WBC 5.0 06/08/2018   HGB 13.1 06/08/2018   HCT 38.1 06/08/2018   PLT 228.0 06/08/2018   GLUCOSE 119 (H) 10/04/2018   CHOL 195 10/04/2018   TRIG 143.0 10/04/2018   HDL 57.10 10/04/2018   LDLDIRECT 112.0 06/04/2015   LDLCALC 109 (H) 10/04/2018   ALT 16 10/04/2018   AST 14 10/04/2018   NA 141 10/04/2018   K 3.9 10/04/2018   CL 104 10/04/2018   CREATININE 0.94 10/04/2018   BUN 23 10/04/2018   CO2 30 10/04/2018   TSH 1.17 06/08/2018   INR 1.41 08/12/2011   HGBA1C 5.1 09/13/2016       Assessment & Plan:   Problem List Items Addressed This Visit    Anxiety    Seeing Dr Nicolasa Ducking.  zoloft added.  Doing better.  Feels better.  Follow.        Ductal carcinoma in situ (DCIS) of right breast    Mammogram 07/04/18 - Birads II.  Followed by Dr Bary Castilla.        Essential hypertension, benign    Blood pressure remains a little elevated.  Amlodipine 2.5mg  q day.  Follow pressures.  Follow metabolic panel.        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  Stable on carbatrol.        Relevant Orders   Carbamazepine level, total   Hypercholesterolemia    On pravastatin.  Low cholesterol diet and exercise.  Follow lipid panel and liver function tests.        Relevant Medications   amLODipine (NORVASC)  2.5 MG tablet   Other Relevant Orders   Hepatic function panel   Lipid panel   Hypothyroidism    On thyroid replacement.  Follow tsh.        Obstructive sleep apnea    CPAP.        Other Visit Diagnoses    Hyperglycemia    -  Primary   Relevant Orders   Hemoglobin A1c       Einar Pheasant, MD

## 2018-10-14 ENCOUNTER — Encounter: Payer: Self-pay | Admitting: Internal Medicine

## 2018-10-14 NOTE — Assessment & Plan Note (Signed)
CPAP.  

## 2018-10-14 NOTE — Assessment & Plan Note (Signed)
Mammogram 07/04/18 - Birads II.  Followed by Dr Bary Castilla.

## 2018-10-14 NOTE — Assessment & Plan Note (Signed)
On pravastatin.  Low cholesterol diet and exercise.  Follow lipid panel and liver function tests.   

## 2018-10-14 NOTE — Assessment & Plan Note (Signed)
Seeing Dr Nicolasa Ducking.  zoloft added.  Doing better.  Feels better.  Follow.

## 2018-10-14 NOTE — Assessment & Plan Note (Signed)
Blood pressure remains a little elevated.  Amlodipine 2.5mg  q day.  Follow pressures.  Follow metabolic panel.

## 2018-10-14 NOTE — Assessment & Plan Note (Signed)
On thyroid replacement.  Follow tsh.  

## 2018-10-14 NOTE — Assessment & Plan Note (Signed)
Has been followed by Dr Jannifer Franklin and Cecille Rubin.  Stable on carbatrol.

## 2018-12-02 ENCOUNTER — Other Ambulatory Visit: Payer: Self-pay | Admitting: Internal Medicine

## 2018-12-06 ENCOUNTER — Other Ambulatory Visit (INDEPENDENT_AMBULATORY_CARE_PROVIDER_SITE_OTHER): Payer: BLUE CROSS/BLUE SHIELD

## 2018-12-06 DIAGNOSIS — G40309 Generalized idiopathic epilepsy and epileptic syndromes, not intractable, without status epilepticus: Secondary | ICD-10-CM | POA: Diagnosis not present

## 2018-12-06 DIAGNOSIS — R739 Hyperglycemia, unspecified: Secondary | ICD-10-CM

## 2018-12-06 DIAGNOSIS — E7800 Pure hypercholesterolemia, unspecified: Secondary | ICD-10-CM | POA: Diagnosis not present

## 2018-12-06 DIAGNOSIS — I10 Essential (primary) hypertension: Secondary | ICD-10-CM

## 2018-12-06 LAB — LIPID PANEL
Cholesterol: 240 mg/dL — ABNORMAL HIGH (ref 0–200)
HDL: 61.1 mg/dL (ref >39.00)
LDL Cholesterol: 149 mg/dL — ABNORMAL HIGH (ref 0–99)
NonHDL: 178.98
Total CHOL/HDL Ratio: 4
Triglycerides: 152 mg/dL — ABNORMAL HIGH (ref 0.0–149.0)
VLDL: 30.4 mg/dL (ref 0.0–40.0)

## 2018-12-06 LAB — BASIC METABOLIC PANEL
BUN: 18 mg/dL (ref 6–23)
CO2: 29 mEq/L (ref 19–32)
Calcium: 9.1 mg/dL (ref 8.4–10.5)
Chloride: 103 mEq/L (ref 96–112)
Creatinine, Ser: 0.87 mg/dL (ref 0.40–1.20)
GFR: 71.37 mL/min (ref >60.00)
Glucose, Bld: 102 mg/dL — ABNORMAL HIGH (ref 70–99)
Potassium: 3.5 mEq/L (ref 3.5–5.1)
Sodium: 140 mEq/L (ref 135–145)

## 2018-12-06 LAB — HEPATIC FUNCTION PANEL
ALT: 20 U/L (ref 0–35)
AST: 14 U/L (ref 0–37)
Albumin: 4.4 g/dL (ref 3.5–5.2)
Alkaline Phosphatase: 51 U/L (ref 39–117)
Bilirubin, Direct: 0.1 mg/dL (ref 0.0–0.3)
Total Bilirubin: 0.5 mg/dL (ref 0.2–1.2)
Total Protein: 6.9 g/dL (ref 6.0–8.3)

## 2018-12-06 LAB — CBC WITH DIFFERENTIAL/PLATELET
BASOS ABS: 0.1 10*3/uL (ref 0.0–0.1)
Basophils Relative: 1.6 % (ref 0.0–3.0)
Eosinophils Absolute: 0.1 10*3/uL (ref 0.0–0.7)
Eosinophils Relative: 2.5 % (ref 0.0–5.0)
HCT: 39.1 % (ref 36.0–46.0)
HEMOGLOBIN: 13.3 g/dL (ref 12.0–15.0)
Lymphocytes Relative: 38.7 % (ref 12.0–46.0)
Lymphs Abs: 1.9 10*3/uL (ref 0.7–4.0)
MCHC: 34 g/dL (ref 30.0–36.0)
MCV: 91.5 fl (ref 78.0–100.0)
Monocytes Absolute: 0.3 10*3/uL (ref 0.1–1.0)
Monocytes Relative: 6.3 % (ref 3.0–12.0)
Neutro Abs: 2.5 10*3/uL (ref 1.4–7.7)
Neutrophils Relative %: 50.9 % (ref 43.0–77.0)
Platelets: 233 10*3/uL (ref 150.0–400.0)
RBC: 4.27 Mil/uL (ref 3.87–5.11)
RDW: 12.9 % (ref 11.5–15.5)
WBC: 4.9 10*3/uL (ref 4.0–10.5)

## 2018-12-06 LAB — HEMOGLOBIN A1C: Hgb A1c MFr Bld: 5.9 % (ref 4.6–6.5)

## 2018-12-07 LAB — CARBAMAZEPINE LEVEL, TOTAL: CARBAMAZEPINE LVL: 5.6 mg/L (ref 4.0–12.0)

## 2018-12-10 ENCOUNTER — Encounter: Payer: Self-pay | Admitting: Internal Medicine

## 2018-12-10 ENCOUNTER — Ambulatory Visit: Payer: BLUE CROSS/BLUE SHIELD | Admitting: Internal Medicine

## 2018-12-10 DIAGNOSIS — F419 Anxiety disorder, unspecified: Secondary | ICD-10-CM

## 2018-12-10 DIAGNOSIS — E039 Hypothyroidism, unspecified: Secondary | ICD-10-CM

## 2018-12-10 DIAGNOSIS — I10 Essential (primary) hypertension: Secondary | ICD-10-CM | POA: Diagnosis not present

## 2018-12-10 DIAGNOSIS — D0511 Intraductal carcinoma in situ of right breast: Secondary | ICD-10-CM

## 2018-12-10 DIAGNOSIS — G40309 Generalized idiopathic epilepsy and epileptic syndromes, not intractable, without status epilepticus: Secondary | ICD-10-CM

## 2018-12-10 DIAGNOSIS — E7800 Pure hypercholesterolemia, unspecified: Secondary | ICD-10-CM

## 2018-12-10 MED ORDER — AMLODIPINE BESYLATE 5 MG PO TABS: 5.0000 mg | ORAL_TABLET | Freq: Every day | ORAL | 3 refills | Status: DC

## 2018-12-10 NOTE — Progress Notes (Signed)
Patient ID: Jennifer Pratt, female   DOB: [birth year], 58 y.o.   MRN: [id number]   Subjective:    Patient ID: Jennifer Pratt, female    DOB: 06-May-1961, 58 y.o.   MRN: [id number]  HPI  Patient here for a scheduled follow up. She reports she is doing relatively well.  Feels better.  Working out at home 5 days/week.  No chest pain.  No sob.  No acid reflux.  No abdominal pain.  Bowels moving.  No urine change.  Handling stress.  Seeing Dr Nicolasa Ducking.  On zoloft now.  She feels is helping.  Blood pressure still a little elevated.     Past Medical History:  Diagnosis Date  . Arthritis   . 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  . Hypercholesterolemia   . Hypertension   . Hypothyroidism   . 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 Sleep apnea    USES CPAP  . Ulcer    Gastric   Past Surgical History:  Procedure Laterality Date  . BREAST BIOPSY Right 06/28/2017   Stereo affirm- UOQ/DUCTAL CARCINOMA IN SITU, HIGH NUCLEAR GRADE WITH COMEDONECROSIS   . BREAST EXCISIONAL BIOPSY Right 07/17/2017   WIde excision upper outer quadrant DCIS  Dr. Bary Castilla  . BREAST LUMPECTOMY Right 07/17/2017   DCIS mammosite  . CESAREAN SECTION    . COLONOSCOPY N/A 10/05/2016   Procedure: COLONOSCOPY;  Surgeon: Manya Silvas, MD;  Location: [facility] ENDOSCOPY;  Service: Endoscopy;  Laterality: N/A;  Zosyn IVPB  . JOINT REPLACEMENT    . MASTECTOMY, PARTIAL Right 07/17/2017   Procedure: MASTECTOMY PARTIAL;  Surgeon: Robert Bellow, MD;  Location: [facility] ORS;  Service: General;  Laterality: Right;  . thumb surgery Right 2005   AND CTR  . THYROIDECTOMY  1988  . TOTAL KNEE ARTHROPLASTY Left 2012   Family History  Problem Relation Age of Onset  . Diabetes Father   . Dementia Mother   . Breast cancer Neg Hx   . Colon cancer Neg Hx    Social History   Socioeconomic History  . Marital status: Married    Spouse name: Not on file  . Number of  children: 1  . Years of education: Not on file  . Highest education level: Not on file  Occupational History    Employer: [employer]  Social Needs  . Financial resource strain: Not on file  . Food insecurity:    Worry: Not on file    Inability: Not on file  . Transportation needs:    Medical: Not on file    Non-medical: Not on file  Tobacco Use  . Smoking status: Former Smoker    Packs/day: 0.25    Years: 2.00    Pack years: 0.50    Types: Cigarettes    Last attempt to quit: 08/01/2017    Years since quitting: 1.3  . Smokeless tobacco: Never Used  . Tobacco comment: PT ONLY SMOKES SOCIALLY-SHE MAY GO A FEW MONTHS WITHOUT EVER SMOKING   Substance and Sexual Activity  . Alcohol use: Yes    Alcohol/week: 0.0 standard drinks    Comment: Socially  . Drug use: No  . Sexual activity: Yes  Lifestyle  . Physical activity:    Days per week: Not on file    Minutes per session: Not on file  . Stress: Not on file  Relationships  . Social connections:  Talks on phone: Not on file    Gets together: Not on file    Attends religious service: Not on file    Active member of club or organization: Not on file    Attends meetings of clubs or organizations: Not on file    Relationship status: Not on file  Other Topics Concern  . Not on file  Social History Narrative  . Not on file    Outpatient Encounter Medications as of 12/10/2018  Medication Sig  . carbamazepine (CARBATROL) 200 MG 12 hr capsule TAKE 1 CAPSULE (200 MG TOTAL) BY MOUTH 2 (TWO) TIMES DAILY.  Marland Kitchen diclofenac (VOLTAREN) 75 MG EC tablet Take 1 tablet (75 mg total) by mouth 2 (two) times daily as needed. (Patient taking differently: Take 75 mg by mouth 2 (two) times daily. )  . DULoxetine (CYMBALTA) 60 MG capsule TAKE 1 CAPSULE BY MOUTH EVERY DAY  . fluticasone (FLONASE) 50 MCG/ACT nasal spray Place 1 spray into both nostrils daily as needed for allergies or rhinitis.  Marland Kitchen levothyroxine (SYNTHROID, LEVOTHROID) 175 MCG  tablet TAKE 1 TABLET BY MOUTH EVERY DAY  . pravastatin (PRAVACHOL) 20 MG tablet Take 1 tablet (20 mg total) by mouth daily.  . raloxifene (EVISTA) 60 MG tablet TAKE 1 TABLET BY MOUTH EVERY DAY  . sertraline (ZOLOFT) 50 MG tablet Take 50 mg by mouth daily.  . valsartan (DIOVAN) 160 MG tablet TAKE 1 TABLET BY MOUTH EVERY DAY  . [DISCONTINUED] amLODipine (NORVASC) 2.5 MG tablet Take 1 tablet (2.5 mg total) by mouth daily.  Marland Kitchen amLODipine (NORVASC) 5 MG tablet Take 1 tablet (5 mg total) by mouth daily.  . [DISCONTINUED] KLOR-CON 10 10 MEQ tablet TAKE 1 TABLET (10 MEQ TOTAL) BY MOUTH 2 (TWO) TIMES DAILY.   No facility-administered encounter medications on file as of 12/10/2018.     Review of Systems  Constitutional: Negative for appetite change and unexpected weight change.  HENT: Negative for congestion and sinus pressure.   Respiratory: Negative for cough, chest tightness and shortness of breath.   Cardiovascular: Negative for chest pain, palpitations and leg swelling.  Gastrointestinal: Negative for abdominal pain, diarrhea, nausea and vomiting.  Genitourinary: Negative for difficulty urinating and dysuria.  Musculoskeletal: Negative for joint swelling and myalgias.  Skin: Negative for color change and rash.  Neurological: Negative for dizziness, light-headedness and headaches.  Psychiatric/Behavioral: Negative for agitation and dysphoric mood.       Objective:    Physical Exam Constitutional:      General: She is not in acute distress.    Appearance: Normal appearance.  HENT:     Nose: Nose normal. No congestion.     Mouth/Throat:     Pharynx: No oropharyngeal exudate or posterior oropharyngeal erythema.  Neck:     Musculoskeletal: Neck supple. No muscular tenderness.     Thyroid: No thyromegaly.  Cardiovascular:     Rate and Rhythm: Normal rate and regular rhythm.  Pulmonary:     Effort: No respiratory distress.     Breath sounds: Normal breath sounds. No wheezing.    Abdominal:     General: Bowel sounds are normal.     Palpations: Abdomen is soft.     Tenderness: There is no abdominal tenderness.  Musculoskeletal:        General: No swelling or tenderness.  Lymphadenopathy:     Cervical: No cervical adenopathy.  Skin:    Findings: No erythema or rash.  Neurological:     Mental Status: She is alert.  Psychiatric:        Mood and Affect: Mood normal.        Behavior: Behavior normal.     BP 132/88 (BP Location: Left Arm, Patient Position: Sitting, Cuff Size: Normal)   Pulse 96   Temp 98.1 F (36.7 C) (Oral)   Resp 16   Wt 238 lb 3.2 oz (108 kg)   LMP 10/09/2012   SpO2 98%   BMI 36.22 kg/m  Wt Readings from Last 3 Encounters:  12/10/18 238 lb 3.2 oz (108 kg)  10/09/18 236 lb 3.2 oz (107.1 kg)  09/12/18 236 lb 7.1 oz (107.2 kg)     Lab Results  Component Value Date   WBC 4.9 12/06/2018   HGB 13.3 12/06/2018   HCT 39.1 12/06/2018   PLT 233.0 12/06/2018   GLUCOSE 102 (H) 12/06/2018   CHOL 240 (H) 12/06/2018   TRIG 152.0 (H) 12/06/2018   HDL 61.10 12/06/2018   LDLDIRECT 112.0 06/04/2015   LDLCALC 149 (H) 12/06/2018   ALT 20 12/06/2018   AST 14 12/06/2018   NA 140 12/06/2018   K 3.5 12/06/2018   CL 103 12/06/2018   CREATININE 0.87 12/06/2018   BUN 18 12/06/2018   CO2 29 12/06/2018   TSH 1.17 06/08/2018   INR 1.41 08/12/2011   HGBA1C 5.9 12/06/2018       Assessment & Plan:   Problem List Items Addressed This Visit    Anxiety    Followed by Dr Nicolasa Ducking.  On zoloft now.  Doing better.  Follow.        Ductal carcinoma in situ (DCIS) of right breast    Has been followed by Dr Bary Castilla.        Essential hypertension, benign    Blood pressure still elevated.  Increase amlodipine to 5mg  q day.  Follow pressures.  Follow metabolic panel.        Relevant Medications   amLODipine (NORVASC) 5 MG tablet   Generalized nonconvulsive epilepsy (HCC)    Stable on carbatrol.  Has been followed by Dr Jannifer Franklin and Cecille Rubin.         Hypercholesterolemia    On pravastatin.  Low cholesterol diet and exercise.  Follow lipid panel and liver function tests.        Relevant Medications   amLODipine (NORVASC) 5 MG tablet   Hypothyroidism    On thyroid replacement.  Follow tsh.            Einar Pheasant, MD

## 2018-12-12 ENCOUNTER — Encounter: Payer: Self-pay | Admitting: Internal Medicine

## 2018-12-12 NOTE — Assessment & Plan Note (Signed)
On pravastatin.  Low cholesterol diet and exercise.  Follow lipid panel and liver function tests.   

## 2018-12-12 NOTE — Assessment & Plan Note (Signed)
Stable on carbatrol.  Has been followed by Dr Jannifer Franklin and Cecille Rubin.

## 2018-12-12 NOTE — Assessment & Plan Note (Signed)
Blood pressure still elevated.  Increase amlodipine to 5mg  q day.  Follow pressures.  Follow metabolic panel.

## 2018-12-12 NOTE — Assessment & Plan Note (Signed)
On thyroid replacement.  Follow tsh.  

## 2018-12-12 NOTE — Assessment & Plan Note (Signed)
Has been followed by Dr Bary Castilla.

## 2018-12-12 NOTE — Assessment & Plan Note (Signed)
Followed by Dr Nicolasa Ducking.  On zoloft now.  Doing better.  Follow.

## 2018-12-14 ENCOUNTER — Other Ambulatory Visit: Payer: Self-pay | Admitting: Internal Medicine

## 2018-12-24 DIAGNOSIS — M1611 Unilateral primary osteoarthritis, right hip: Secondary | ICD-10-CM | POA: Diagnosis not present

## 2018-12-29 ENCOUNTER — Other Ambulatory Visit: Payer: Self-pay | Admitting: Nurse Practitioner

## 2019-01-09 DIAGNOSIS — F330 Major depressive disorder, recurrent, mild: Secondary | ICD-10-CM | POA: Diagnosis not present

## 2019-01-09 DIAGNOSIS — F4011 Social phobia, generalized: Secondary | ICD-10-CM | POA: Diagnosis not present

## 2019-01-09 NOTE — Progress Notes (Signed)
[facility]  PATIENT: Jennifer Pratt DOB: [birth year]   REASON FOR VISIT: Follow-up for seizure disorder HISTORY FROM: Patient    HISTORY OF PRESENT ILLNESS:UPDATE 01/10/2019 CM Jennifer Pratt is a 58 year old right-handed white female with a history of seizures, well controlled on Carbatrol.  She returns for yearly follow-up.  The patient indicates that her last seizure event was 18 years ago. The patient has not had any seizures since being on Carbatrol. Her seizures used to be associated with her menstrual cycle, but she has not had a period in over five years.  She continues to operate a motor vehicle.  Recent labs drawn last month reviewed.  She returns for reevaluation  REVIEW OF SYSTEMS: Full 14 system review of systems performed and notable only for those listed, all others are neg:  Constitutional: neg  Cardiovascular: neg Ear/Nose/Throat: neg  Skin: neg Eyes: neg Respiratory: neg Gastroitestinal: neg  Hematology/Lymphatic: neg  Endocrine: neg Musculoskeletal: neg Allergy/Immunology: neg Neurological: History of seizure disorder Psychiatric: neg Sleep : neg   ALLERGIES: Allergies  Allergen Reactions  . Lisinopril Other (See Comments)    UNKNOWN     HOME MEDICATIONS: Outpatient Medications Prior to Visit  Medication Sig Dispense Refill  . amLODipine (NORVASC) 5 MG tablet Take 1 tablet (5 mg total) by mouth daily. 30 tablet 3  . carbamazepine (CARBATROL) 200 MG 12 hr capsule TAKE 1 CAPSULE BY MOUTH TWICE A DAY 180 capsule 0  . diclofenac (VOLTAREN) 75 MG EC tablet Take 1 tablet (75 mg total) by mouth 2 (two) times daily as needed. (Patient taking differently: Take 75 mg by mouth 2 (two) times daily. ) 60 tablet 0  . DULoxetine (CYMBALTA) 60 MG capsule TAKE 1 CAPSULE BY MOUTH EVERY DAY 90 capsule 1  . fluticasone (FLONASE) 50 MCG/ACT nasal spray Place 1 spray into both nostrils daily as needed for allergies or rhinitis.    Marland Kitchen levothyroxine  (SYNTHROID, LEVOTHROID) 175 MCG tablet TAKE 1 TABLET BY MOUTH EVERY DAY 90 tablet 0  . pravastatin (PRAVACHOL) 20 MG tablet Take 1 tablet (20 mg total) by mouth daily. 90 tablet 3  . raloxifene (EVISTA) 60 MG tablet TAKE 1 TABLET BY MOUTH EVERY DAY 90 tablet 4  . sertraline (ZOLOFT) 50 MG tablet Take 50 mg by mouth daily.    . valsartan (DIOVAN) 160 MG tablet TAKE 1 TABLET BY MOUTH EVERY DAY 90 tablet 0   No facility-administered medications prior to visit.     PAST MEDICAL HISTORY: Past Medical History:  Diagnosis Date  . Arthritis   . 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  . Hypercholesterolemia   . Hypertension   . Hypothyroidism   . 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 Sleep apnea    USES CPAP  . Ulcer    Gastric    PAST SURGICAL HISTORY: Past Surgical History:  Procedure Laterality Date  . BREAST BIOPSY Right 06/28/2017   Stereo affirm- UOQ/DUCTAL CARCINOMA IN SITU, HIGH NUCLEAR GRADE WITH COMEDONECROSIS   . BREAST EXCISIONAL BIOPSY Right 07/17/2017   WIde excision upper outer quadrant DCIS  Dr. Bary Castilla  . BREAST LUMPECTOMY Right 07/17/2017   DCIS mammosite  . CESAREAN SECTION    . COLONOSCOPY N/A 10/05/2016   Procedure: COLONOSCOPY;  Surgeon: Manya Silvas, MD;  Location: [facility] ENDOSCOPY;  Service: Endoscopy;  Laterality: N/A;  Zosyn IVPB  . JOINT REPLACEMENT    .  MASTECTOMY, PARTIAL Right 07/17/2017   Procedure: MASTECTOMY PARTIAL;  Surgeon: Robert Bellow, MD;  Location: [facility] ORS;  Service: General;  Laterality: Right;  . thumb surgery Right 2005   AND CTR  . THYROIDECTOMY  1988  . TOTAL KNEE ARTHROPLASTY Left 2012    FAMILY HISTORY: Family History  Problem Relation Age of Onset  . Diabetes Father   . Dementia Mother   . Breast cancer Neg Hx   . Colon cancer Neg Hx     SOCIAL HISTORY: Social History   Socioeconomic History  . Marital status: Married    Spouse name: Not on  file  . Number of children: 1  . Years of education: Not on file  . Highest education level: Not on file  Occupational History    Employer: [employer]  Social Needs  . Financial resource strain: Not on file  . Food insecurity:    Worry: Not on file    Inability: Not on file  . Transportation needs:    Medical: Not on file    Non-medical: Not on file  Tobacco Use  . Smoking status: Former Smoker    Packs/day: 0.25    Years: 2.00    Pack years: 0.50    Types: Cigarettes    Last attempt to quit: 08/01/2017    Years since quitting: 1.4  . Smokeless tobacco: Never Used  . Tobacco comment: PT ONLY SMOKES SOCIALLY-SHE MAY GO A FEW MONTHS WITHOUT EVER SMOKING   Substance and Sexual Activity  . Alcohol use: Yes    Alcohol/week: 0.0 standard drinks    Comment: Socially  . Drug use: No  . Sexual activity: Yes  Lifestyle  . Physical activity:    Days per week: Not on file    Minutes per session: Not on file  . Stress: Not on file  Relationships  . Social connections:    Talks on phone: Not on file    Gets together: Not on file    Attends religious service: Not on file    Active member of club or organization: Not on file    Attends meetings of clubs or organizations: Not on file    Relationship status: Not on file  . Intimate partner violence:    Fear of current or ex partner: Not on file    Emotionally abused: Not on file    Physically abused: Not on file    Forced sexual activity: Not on file  Other Topics Concern  . Not on file  Social History Narrative  . Not on file     PHYSICAL EXAM  Vitals:   01/10/19 0935  BP: 138/88  Pulse: 87  Weight: 238 lb (108 kg)  Height: 5\' 8"  (1.727 m)   Body mass index is 36.19 kg/m. General: The patient is alert and cooperative at the time of the examination. The patient is moderately obese. Skin: No significant peripheral edema is noted. Neurologic Exam Mental status: The patient is oriented x 3. Cranial nerves: Facial  symmetry is present. Speech is normal, no aphasia or dysarthria is noted. Extraocular movements are full. Visual fields are full. Motor: The patient has good strength in all 4 extremities. Sensory examination: Soft touch sensation is symmetric on the face, arms, and legs. Coordination: The patient has good finger-nose-finger and heel-to-shin bilaterally. Gait and station: The patient has a normal gait. Tandem gait is normal. Romberg is negative. No drift is seen. Reflexes: Deep tendon reflexes are symmetric upper and lower.  DIAGNOSTIC DATA (LABS, IMAGING, TESTING) - I reviewed patient records, labs, notes, testing and imaging myself where available.  Lab Results  Component Value Date   WBC 4.9 12/06/2018   HGB 13.3 12/06/2018   HCT 39.1 12/06/2018   MCV 91.5 12/06/2018   PLT 233.0 12/06/2018      Component Value Date/Time   NA 140 12/06/2018 0804   K 3.5 12/06/2018 0804   CL 103 12/06/2018 0804   CO2 29 12/06/2018 0804   GLUCOSE 102 (H) 12/06/2018 0804   BUN 18 12/06/2018 0804   CREATININE 0.87 12/06/2018 0804   CALCIUM 9.1 12/06/2018 0804   PROT 6.9 12/06/2018 0804   ALBUMIN 4.4 12/06/2018 0804   AST 14 12/06/2018 0804   ALT 20 12/06/2018 0804   ALKPHOS 51 12/06/2018 0804   BILITOT 0.5 12/06/2018 0804   GFRNONAA >60 08/12/2011 0512   GFRAA >60 08/12/2011 0512   Lab Results  Component Value Date   CHOL 240 (H) 12/06/2018   HDL 61.10 12/06/2018   LDLCALC 149 (H) 12/06/2018   LDLDIRECT 112.0 06/04/2015   TRIG 152.0 (H) 12/06/2018   CHOLHDL 4 12/06/2018   Lab Results  Component Value Date   HGBA1C 5.9 12/06/2018    Lab Results  Component Value Date   TSH 1.17 06/08/2018      ASSESSMENT AND PLAN  58 y.o. year old female  has a past medical history of seizure disorder here to follow-up. She has not had any seizure events for 18 years. She is well controlled on Carbatrol.  PLAN Continue Carbatrol at current dose will refill Recent level of CBZ on 12/06/2018  was 5.6, which is therapeutic Reviewed CBC and liver profile from 12/06/2008. for adverse effects of Carbatrol, within normal limits Call for any seizure activity Follow-up yearly and when necessary Dennie Bible, [facility], [facility], [facility] [address] [place], [place] [address] [phone]

## 2019-01-10 ENCOUNTER — Ambulatory Visit: Payer: BLUE CROSS/BLUE SHIELD | Admitting: Nurse Practitioner

## 2019-01-10 ENCOUNTER — Encounter: Payer: Self-pay | Admitting: Nurse Practitioner

## 2019-01-10 VITALS — BP 138/88 | HR 87 | Ht 68.0 in | Wt 238.0 lb

## 2019-01-10 DIAGNOSIS — G40909 Epilepsy, unspecified, not intractable, without status epilepticus: Secondary | ICD-10-CM

## 2019-01-10 MED ORDER — CARBAMAZEPINE ER 200 MG PO CP12: ORAL_CAPSULE | ORAL | 3 refills | Status: DC

## 2019-01-10 NOTE — Patient Instructions (Signed)
Continue Carbatrol at current dose will refill Recent level of CBZ on 12/06/2018 was 5.6, which is therapeutic Reviewed CBC and liver profile from 12/06/2008. for adverse effects of Carbatrol Call for any seizure activity Follow-up yearly and when necessary

## 2019-01-10 NOTE — Progress Notes (Signed)
I have read the note, and I agree with the clinical assessment and plan.  Belmira Daley K Mansur Patti   

## 2019-01-23 DIAGNOSIS — M1611 Unilateral primary osteoarthritis, right hip: Secondary | ICD-10-CM | POA: Diagnosis not present

## 2019-01-31 IMAGING — MG MM DIGITAL DIAGNOSTIC BILAT W/ TOMO W/ CAD
6 of 9 series · 6 of 25 positions shown · non-contrast
Comparison: Previous exam(s).

CLINICAL DATA: Right lumpectomy.  Annual mammography.

EXAM:
DIGITAL DIAGNOSTIC BILATERAL MAMMOGRAM WITH CAD AND TOMO

[R XCCL]
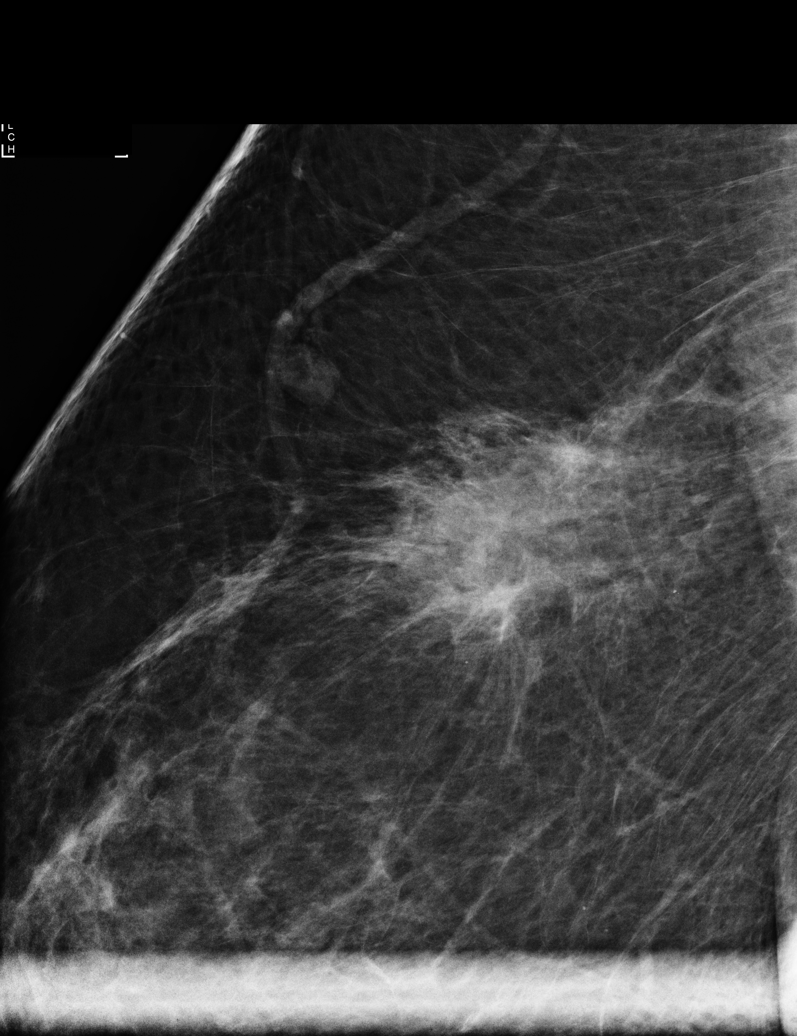

[L MLO synth-2D]
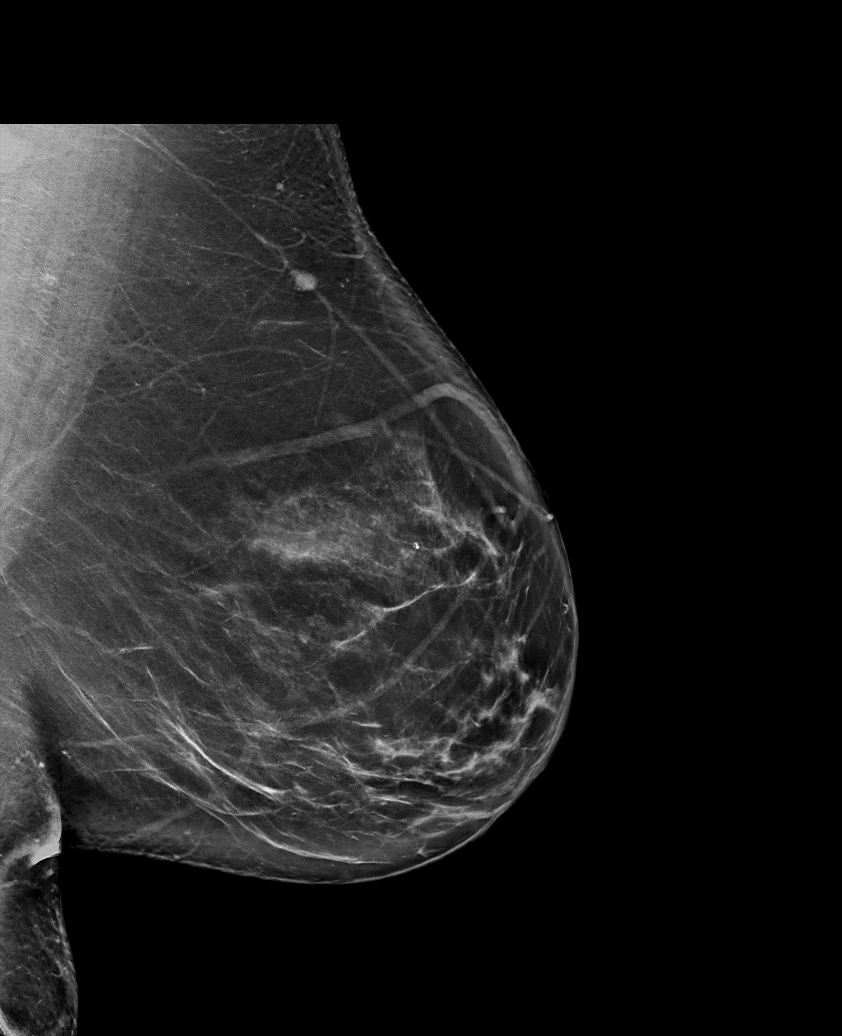

[L CC synth-2D]
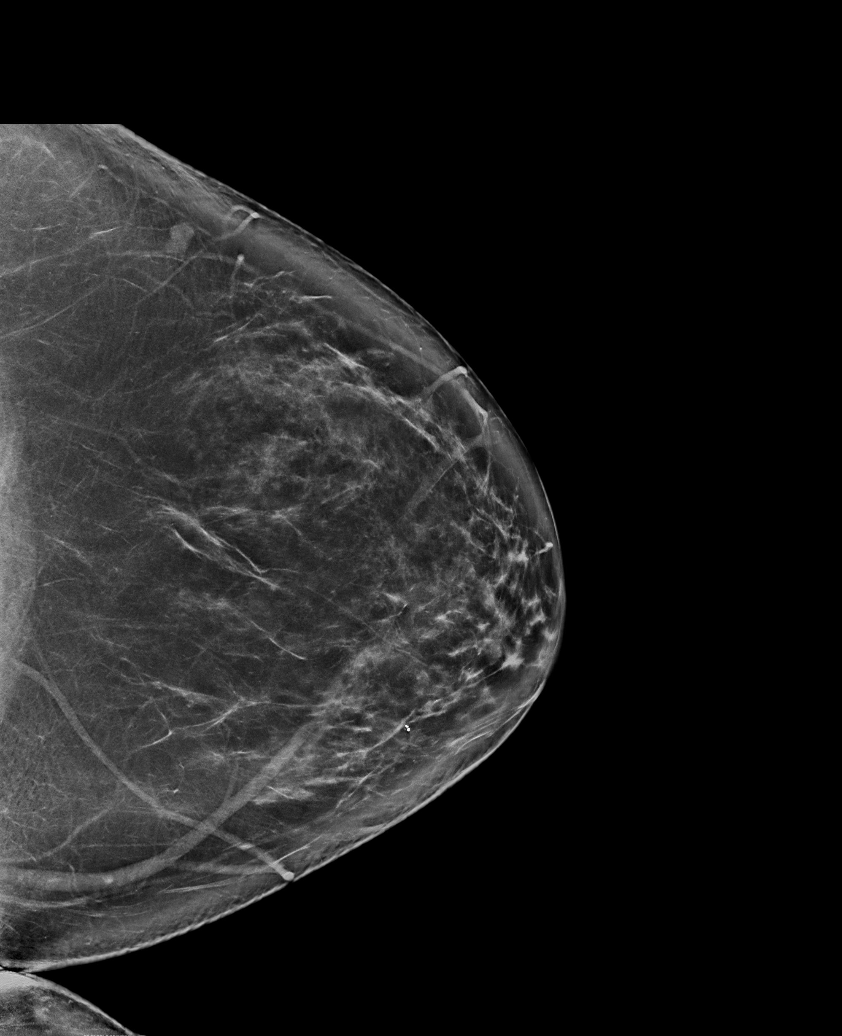

[R CC synth-2D]
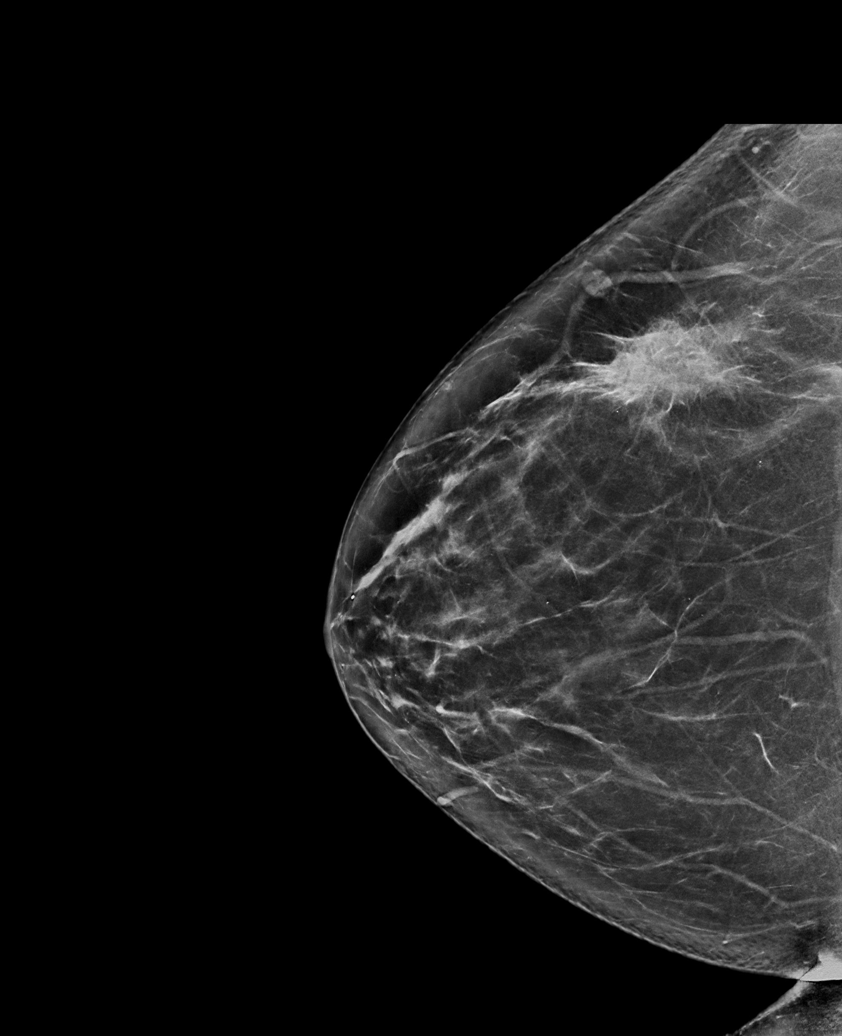

[R MLO synth-2D]
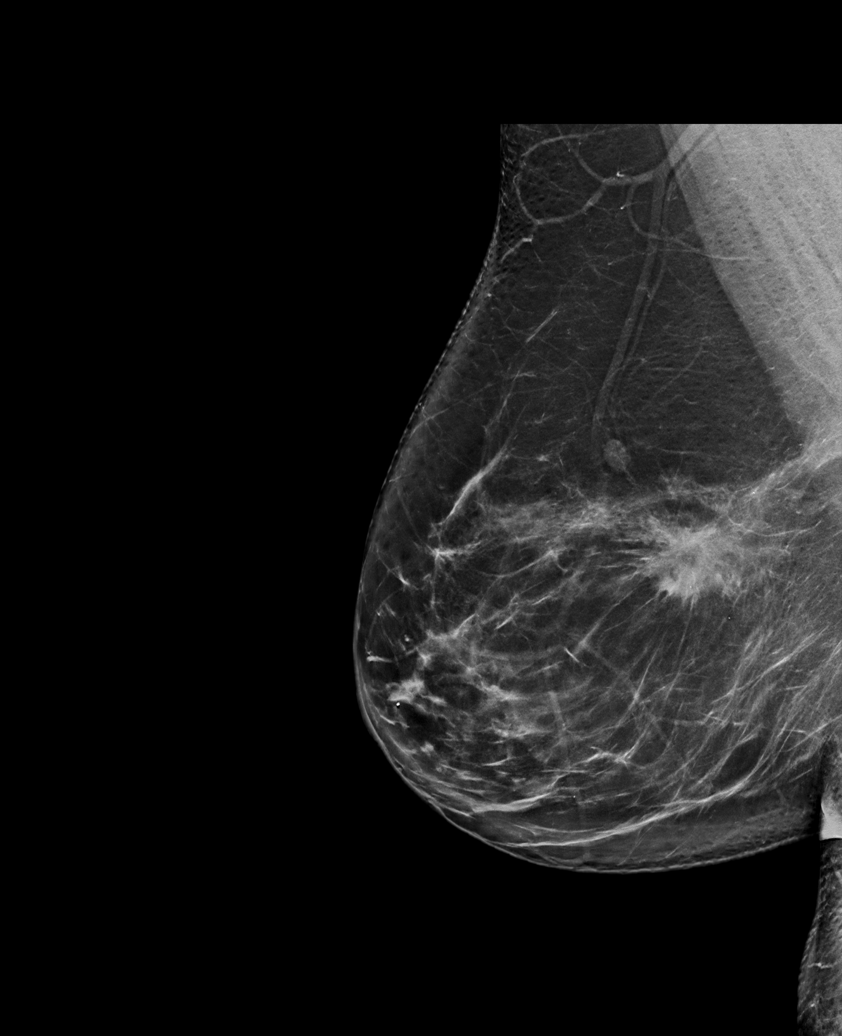

[R MLO tomo · tomo slice 47/94.0]
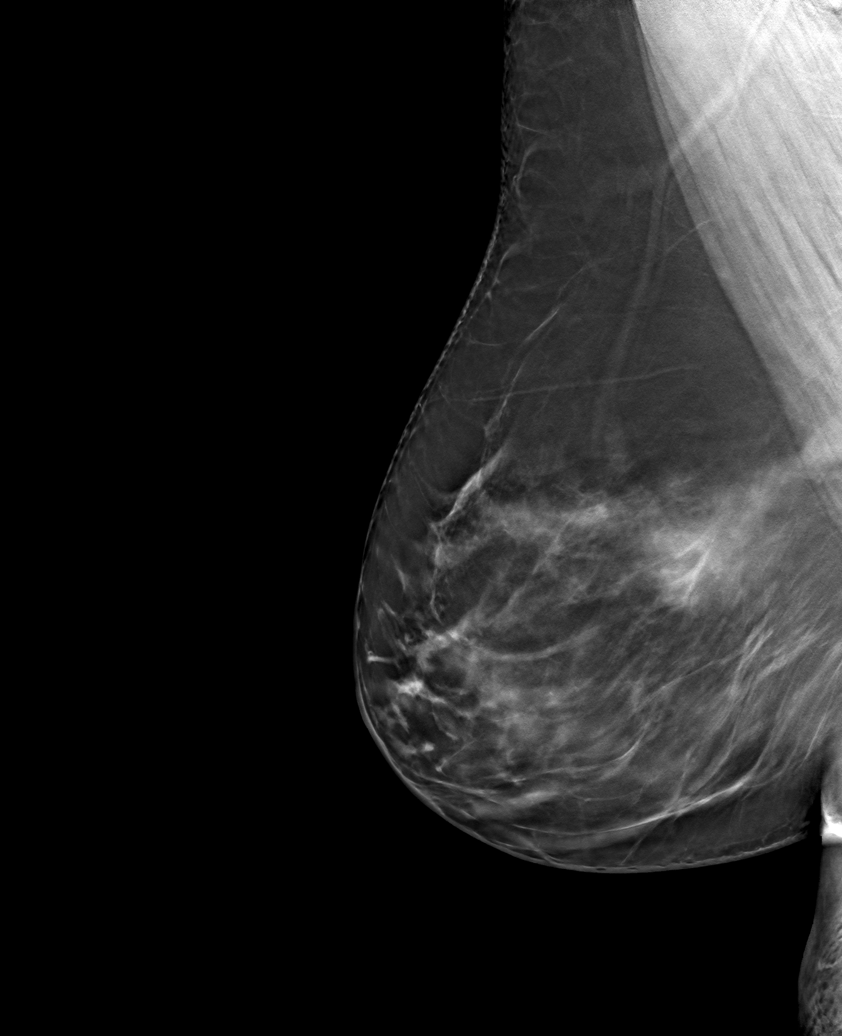

[6 of 25 positions shown; findings below may reference images not displayed]

ACR Breast Density Category b: There are scattered areas of
fibroglandular density.
FINDINGS: The right lumpectomy site is stable.  No evidence of malignancy.

Mammographic images were processed with CAD.
IMPRESSION: No evidence of malignancy.

RECOMMENDATION:
Annual diagnostic mammography.

I have discussed the findings and recommendations with the patient.
Results were also provided in writing at the conclusion of the
visit. If applicable, a reminder letter will be sent to the patient
regarding the next appointment.

BI-RADS CATEGORY  2: Benign.

## 2019-02-07 ENCOUNTER — Ambulatory Visit: Payer: BLUE CROSS/BLUE SHIELD | Admitting: Internal Medicine

## 2019-02-26 ENCOUNTER — Other Ambulatory Visit: Payer: Self-pay | Admitting: Internal Medicine

## 2019-05-02 ENCOUNTER — Other Ambulatory Visit: Payer: Self-pay | Admitting: Internal Medicine

## 2019-05-15 DIAGNOSIS — F4011 Social phobia, generalized: Secondary | ICD-10-CM | POA: Diagnosis not present

## 2019-05-15 DIAGNOSIS — F330 Major depressive disorder, recurrent, mild: Secondary | ICD-10-CM | POA: Diagnosis not present

## 2019-05-20 DIAGNOSIS — M5416 Radiculopathy, lumbar region: Secondary | ICD-10-CM | POA: Diagnosis not present

## 2019-05-27 ENCOUNTER — Other Ambulatory Visit: Payer: Self-pay | Admitting: *Deleted

## 2019-05-27 DIAGNOSIS — D0511 Intraductal carcinoma in situ of right breast: Secondary | ICD-10-CM

## 2019-05-28 ENCOUNTER — Other Ambulatory Visit: Payer: Self-pay | Admitting: Internal Medicine

## 2019-06-06 DIAGNOSIS — M5416 Radiculopathy, lumbar region: Secondary | ICD-10-CM | POA: Diagnosis not present

## 2019-06-17 ENCOUNTER — Encounter: Payer: Self-pay | Admitting: General Surgery

## 2019-06-17 ENCOUNTER — Encounter: Payer: Self-pay | Admitting: Internal Medicine

## 2019-06-17 DIAGNOSIS — M6281 Muscle weakness (generalized): Secondary | ICD-10-CM | POA: Diagnosis not present

## 2019-06-17 DIAGNOSIS — M5416 Radiculopathy, lumbar region: Secondary | ICD-10-CM | POA: Diagnosis not present

## 2019-06-20 ENCOUNTER — Other Ambulatory Visit (HOSPITAL_COMMUNITY)
Admission: RE | Admit: 2019-06-20 | Discharge: 2019-06-20 | Disposition: A | Discharge status: Home / Self Care | Payer: BC Managed Care – PPO | Source: Ambulatory Visit | Attending: Internal Medicine | Admitting: Internal Medicine

## 2019-06-20 ENCOUNTER — Encounter: Payer: Self-pay | Admitting: Internal Medicine

## 2019-06-20 ENCOUNTER — Ambulatory Visit (INDEPENDENT_AMBULATORY_CARE_PROVIDER_SITE_OTHER): Payer: BC Managed Care – PPO | Admitting: Internal Medicine

## 2019-06-20 ENCOUNTER — Other Ambulatory Visit: Payer: Self-pay

## 2019-06-20 VITALS — BP 128/78 | HR 96 | Temp 98.9°F | Resp 16 | Ht 68.0 in | Wt 235.0 lb

## 2019-06-20 DIAGNOSIS — E7800 Pure hypercholesterolemia, unspecified: Secondary | ICD-10-CM

## 2019-06-20 DIAGNOSIS — Z124 Encounter for screening for malignant neoplasm of cervix: Secondary | ICD-10-CM

## 2019-06-20 DIAGNOSIS — F439 Reaction to severe stress, unspecified: Secondary | ICD-10-CM

## 2019-06-20 DIAGNOSIS — G4733 Obstructive sleep apnea (adult) (pediatric): Secondary | ICD-10-CM

## 2019-06-20 DIAGNOSIS — N760 Acute vaginitis: Secondary | ICD-10-CM

## 2019-06-20 DIAGNOSIS — R739 Hyperglycemia, unspecified: Secondary | ICD-10-CM

## 2019-06-20 DIAGNOSIS — D0511 Intraductal carcinoma in situ of right breast: Secondary | ICD-10-CM

## 2019-06-20 DIAGNOSIS — N9489 Other specified conditions associated with female genital organs and menstrual cycle: Secondary | ICD-10-CM

## 2019-06-20 DIAGNOSIS — M5416 Radiculopathy, lumbar region: Secondary | ICD-10-CM | POA: Diagnosis not present

## 2019-06-20 DIAGNOSIS — Z0000 Encounter for general adult medical examination without abnormal findings: Secondary | ICD-10-CM

## 2019-06-20 DIAGNOSIS — I10 Essential (primary) hypertension: Secondary | ICD-10-CM

## 2019-06-20 DIAGNOSIS — Z1231 Encounter for screening mammogram for malignant neoplasm of breast: Secondary | ICD-10-CM

## 2019-06-20 DIAGNOSIS — G40309 Generalized idiopathic epilepsy and epileptic syndromes, not intractable, without status epilepticus: Secondary | ICD-10-CM

## 2019-06-20 DIAGNOSIS — E039 Hypothyroidism, unspecified: Secondary | ICD-10-CM

## 2019-06-20 DIAGNOSIS — N949 Unspecified condition associated with female genital organs and menstrual cycle: Secondary | ICD-10-CM

## 2019-06-20 DIAGNOSIS — F419 Anxiety disorder, unspecified: Secondary | ICD-10-CM

## 2019-06-20 DIAGNOSIS — M6281 Muscle weakness (generalized): Secondary | ICD-10-CM | POA: Diagnosis not present

## 2019-06-20 NOTE — Progress Notes (Signed)
Patient ID: Jennifer Pratt, female   DOB: October 19, 1961, 58 y.o.   MRN: [id number]   Subjective:    Patient ID: Jennifer Pratt, female    DOB: January 16, 1961, 58 y.o.   MRN: [id number]  HPI  Patient here for her physical exam.  She reports she is doing relatively well.  Trying to stay in due to covid restrictions.  No fever.  No chest congestion, cough or sob.  No acid reflux.  No abdominal pain.  Bowels moving.  No chest pain.  No seizures.  On carbatrol.  Sees neurology.  Stable.  Also reports some vaginal burning.  No dysuria.  No discharge.  Possible vaginal dryness.  Sees Dr Nicolasa Ducking.  On zoloft.  Stable.  Was seeing Dr Bary Castilla for f/u DCIS.  Due to f/u with Dr Donella Stade.  Discussed shingrx.     Past Medical History:  Diagnosis Date  . Arthritis   . 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  . Hypercholesterolemia   . Hypertension   . Hypothyroidism   . 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 Sleep apnea    USES CPAP  . Ulcer    Gastric   Past Surgical History:  Procedure Laterality Date  . BREAST BIOPSY Right 06/28/2017   Stereo affirm- UOQ/DUCTAL CARCINOMA IN SITU, HIGH NUCLEAR GRADE WITH COMEDONECROSIS   . BREAST EXCISIONAL BIOPSY Right 07/17/2017   WIde excision upper outer quadrant DCIS  Dr. Bary Castilla  . BREAST LUMPECTOMY Right 07/17/2017   DCIS mammosite  . CESAREAN SECTION    . COLONOSCOPY N/A 10/05/2016   Procedure: COLONOSCOPY;  Surgeon: Manya Silvas, MD;  Location: [facility] ENDOSCOPY;  Service: Endoscopy;  Laterality: N/A;  Zosyn IVPB  . JOINT REPLACEMENT    . MASTECTOMY, PARTIAL Right 07/17/2017   Procedure: MASTECTOMY PARTIAL;  Surgeon: Robert Bellow, MD;  Location: [facility] ORS;  Service: General;  Laterality: Right;  . thumb surgery Right 2005   AND CTR  . THYROIDECTOMY  1988  . TOTAL KNEE ARTHROPLASTY Left 2012   Family History  Problem Relation Age of Onset  . Diabetes Father   . Dementia Mother   .  Breast cancer Neg Hx   . Colon cancer Neg Hx    Social History   Socioeconomic History  . Marital status: Married    Spouse name: Not on file  . Number of children: 1  . Years of education: Not on file  . Highest education level: Not on file  Occupational History    Employer: [employer]  Social Needs  . Financial resource strain: Not on file  . Food insecurity    Worry: Not on file    Inability: Not on file  . Transportation needs    Medical: Not on file    Non-medical: Not on file  Tobacco Use  . Smoking status: Former Smoker    Packs/day: 0.25    Years: 2.00    Pack years: 0.50    Types: Cigarettes    Quit date: 08/01/2017    Years since quitting: 1.8  . Smokeless tobacco: Never Used  . Tobacco comment: PT ONLY SMOKES SOCIALLY-SHE MAY GO A FEW MONTHS WITHOUT EVER SMOKING   Substance and Sexual Activity  . Alcohol use: Yes    Alcohol/week: 0.0 standard drinks    Comment: Socially  . Drug use: No  . Sexual activity: Yes  Lifestyle  . Physical activity  Days per week: Not on file    Minutes per session: Not on file  . Stress: Not on file  Relationships  . Social Herbalist on phone: Not on file    Gets together: Not on file    Attends religious service: Not on file    Active member of club or organization: Not on file    Attends meetings of clubs or organizations: Not on file    Relationship status: Not on file  Other Topics Concern  . Not on file  Social History Narrative  . Not on file    Outpatient Encounter Medications as of 06/20/2019  Medication Sig  . amLODipine (NORVASC) 5 MG tablet TAKE 1 TABLET BY MOUTH EVERY DAY  . carbamazepine (CARBATROL) 200 MG 12 hr capsule TAKE 1 CAPSULE BY MOUTH TWICE A DAY  . diclofenac (VOLTAREN) 75 MG EC tablet Take 1 tablet (75 mg total) by mouth 2 (two) times daily as needed. (Patient taking differently: Take 75 mg by mouth 2 (two) times daily. )  . DULoxetine (CYMBALTA) 60 MG capsule TAKE 1 CAPSULE BY  MOUTH EVERY DAY  . fluticasone (FLONASE) 50 MCG/ACT nasal spray Place 1 spray into both nostrils daily as needed for allergies or rhinitis.  Marland Kitchen levothyroxine (SYNTHROID) 175 MCG tablet TAKE 1 TABLET BY MOUTH EVERY DAY  . pravastatin (PRAVACHOL) 20 MG tablet Take 1 tablet (20 mg total) by mouth daily.  . raloxifene (EVISTA) 60 MG tablet TAKE 1 TABLET BY MOUTH EVERY DAY  . sertraline (ZOLOFT) 50 MG tablet Take 50 mg by mouth daily.  . valsartan (DIOVAN) 160 MG tablet TAKE 1 TABLET BY MOUTH EVERY DAY  . [DISCONTINUED] KLOR-CON 10 10 MEQ tablet TAKE 1 TABLET (10 MEQ TOTAL) BY MOUTH 2 (TWO) TIMES DAILY.   No facility-administered encounter medications on file as of 06/20/2019.     Review of Systems  Constitutional: Negative for appetite change and unexpected weight change.  HENT: Negative for congestion and sinus pressure.   Eyes: Negative for pain and visual disturbance.  Respiratory: Negative for cough, chest tightness and shortness of breath.   Cardiovascular: Negative for chest pain, palpitations and leg swelling.  Gastrointestinal: Negative for abdominal pain, diarrhea, nausea and vomiting.  Genitourinary: Negative for difficulty urinating and dysuria.       Vaginal burning.    Musculoskeletal: Negative for joint swelling and myalgias.  Skin: Negative for color change and rash.  Neurological: Negative for dizziness, light-headedness and headaches.  Hematological: Negative for adenopathy. Does not bruise/bleed easily.  Psychiatric/Behavioral: Negative for agitation and dysphoric mood.       Objective:    Physical Exam Constitutional:      General: She is not in acute distress.    Appearance: Normal appearance. She is well-developed.  HENT:     Right Ear: External ear normal. There is no impacted cerumen.     Left Ear: External ear normal. There is no impacted cerumen.  Eyes:     General: No scleral icterus.       Right eye: No discharge.        Left eye: No discharge.      Conjunctiva/sclera: Conjunctivae normal.  Neck:     Musculoskeletal: Neck supple. No muscular tenderness.     Thyroid: No thyromegaly.  Cardiovascular:     Rate and Rhythm: Normal rate and regular rhythm.  Pulmonary:     Effort: No tachypnea, accessory muscle usage or respiratory distress.     Breath  sounds: Normal breath sounds. No decreased breath sounds or wheezing.  Chest:     Breasts:        Right: No inverted nipple, mass, nipple discharge or tenderness (no axillary adenopathy).        Left: No inverted nipple, mass, nipple discharge or tenderness (no axilarry adenopathy).  Abdominal:     General: Bowel sounds are normal.     Palpations: Abdomen is soft.     Tenderness: There is no abdominal tenderness.  Genitourinary:    Comments: Normal external genitalia.  Vaginal vault without lesions.  Cervix identified.  Pap smear performed.  Could not appreciate any adnexal masses or tenderness.   Musculoskeletal:        General: No swelling or tenderness.  Lymphadenopathy:     Cervical: No cervical adenopathy.  Skin:    Findings: No erythema or rash.  Neurological:     Mental Status: She is alert and oriented to person, place, and time.  Psychiatric:        Mood and Affect: Mood normal.        Behavior: Behavior normal.     BP 128/78   Pulse 96   Temp 98.9 F (37.2 C) (Oral)   Resp 16   Ht 5\' 8"  (1.727 m)   Wt 235 lb (106.6 kg)   LMP 10/09/2012   SpO2 98%   BMI 35.73 kg/m  Wt Readings from Last 3 Encounters:  06/20/19 235 lb (106.6 kg)  01/10/19 238 lb (108 kg)  12/10/18 238 lb 3.2 oz (108 kg)     Lab Results  Component Value Date   WBC 4.9 12/06/2018   HGB 13.3 12/06/2018   HCT 39.1 12/06/2018   PLT 233.0 12/06/2018   GLUCOSE 102 (H) 12/06/2018   CHOL 240 (H) 12/06/2018   TRIG 152.0 (H) 12/06/2018   HDL 61.10 12/06/2018   LDLDIRECT 112.0 06/04/2015   LDLCALC 149 (H) 12/06/2018   ALT 20 12/06/2018   AST 14 12/06/2018   NA 140 12/06/2018   K 3.5  12/06/2018   CL 103 12/06/2018   CREATININE 0.87 12/06/2018   BUN 18 12/06/2018   CO2 29 12/06/2018   TSH 1.17 06/08/2018   INR 1.41 08/12/2011   HGBA1C 5.9 12/06/2018       Assessment & Plan:   Problem List Items Addressed This Visit    Anxiety    Followed by Dr Nicolasa Ducking.  On zoloft.  Doing well.  Follow.        Ductal carcinoma in situ (DCIS) of right breast    Has been followed by Dr Bary Castilla.  Also sees Dr Donella Stade.  Schedule f/u diagnostic mammogram and ultrasound.        Relevant Orders   MM DIAG BREAST TOMO BILATERAL   US BREAST LTD UNI LEFT INC AXILLA   US BREAST LTD UNI RIGHT INC AXILLA   Essential hypertension, benign    Blood pressure as outlined.  Doing well.  Continue current medication regimen.  Follow pressures.  Follow metabolic panel.        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  Followed by neurology.        Relevant Orders   Carbamazepine level, total   Health care maintenance    Physical today 06/20/19.  PAP 06/20/19.  Colonoscopy 09/2016.        Hypercholesterolemia    On pravastatin.  Low cholesterol diet and exercise.  Follow lipid panel and liver function tests.        Relevant Orders   Hepatic function panel   Lipid panel   Hypothyroidism    On thyroid replacement.  Follow tsh.       Relevant Orders   TSH   Obstructive sleep apnea    CPAP.       Stress    On cymbalta.  Seeing Dr Nicolasa Ducking.  Stable. Doing well.        Vaginal burning    Vaginal burning.  KOH/wet prep obtained.  PAP performed.         Other Visit Diagnoses    Visit for screening mammogram    -  Primary   Relevant Orders   MM 3D SCREEN BREAST BILATERAL   Acute vaginitis       Relevant Orders   Cervicovaginal ancillary only( [place])   Cervical cancer screening       Relevant Orders   Cytology - PAP( [place])   Hyperglycemia       Relevant Orders   Hemoglobin A1c       Einar Pheasant, MD

## 2019-06-22 ENCOUNTER — Encounter: Payer: Self-pay | Admitting: Internal Medicine

## 2019-06-22 DIAGNOSIS — N9489 Other specified conditions associated with female genital organs and menstrual cycle: Secondary | ICD-10-CM | POA: Insufficient documentation

## 2019-06-22 DIAGNOSIS — N949 Unspecified condition associated with female genital organs and menstrual cycle: Secondary | ICD-10-CM | POA: Insufficient documentation

## 2019-06-22 NOTE — Assessment & Plan Note (Signed)
On pravastatin.  Low cholesterol diet and exercise.  Follow lipid panel and liver function tests.   

## 2019-06-22 NOTE — Assessment & Plan Note (Signed)
On thyroid replacement.  Follow tsh.  

## 2019-06-22 NOTE — Assessment & Plan Note (Signed)
CPAP.  

## 2019-06-22 NOTE — Assessment & Plan Note (Signed)
Blood pressure as outlined.  Doing well.  Continue current medication regimen.  Follow pressures.  Follow metabolic panel.

## 2019-06-22 NOTE — Assessment & Plan Note (Signed)
Has been followed by Dr Bary Castilla.  Also sees Dr Donella Stade.  Schedule f/u diagnostic mammogram and ultrasound.

## 2019-06-22 NOTE — Assessment & Plan Note (Signed)
Physical today 06/20/19.  PAP 06/20/19.  Colonoscopy 09/2016.

## 2019-06-22 NOTE — Assessment & Plan Note (Signed)
On cymbalta.  Seeing Dr Nicolasa Ducking.  Stable. Doing well.

## 2019-06-22 NOTE — Assessment & Plan Note (Signed)
Stable on carbatrol.  Followed by neurology.

## 2019-06-22 NOTE — Assessment & Plan Note (Signed)
Vaginal burning.  KOH/wet prep obtained.  PAP performed.

## 2019-06-22 NOTE — Assessment & Plan Note (Signed)
Followed by Dr Kapur.  On zoloft.  Doing well.  Follow.  

## 2019-06-24 ENCOUNTER — Other Ambulatory Visit: Payer: Self-pay

## 2019-06-24 ENCOUNTER — Encounter: Payer: Self-pay | Admitting: Internal Medicine

## 2019-06-24 DIAGNOSIS — M5416 Radiculopathy, lumbar region: Secondary | ICD-10-CM | POA: Diagnosis not present

## 2019-06-24 DIAGNOSIS — D0511 Intraductal carcinoma in situ of right breast: Secondary | ICD-10-CM

## 2019-06-24 DIAGNOSIS — M6281 Muscle weakness (generalized): Secondary | ICD-10-CM | POA: Diagnosis not present

## 2019-06-24 LAB — CERVICOVAGINAL ANCILLARY ONLY
Bacterial vaginitis: NEGATIVE
Candida vaginitis: NEGATIVE

## 2019-06-25 LAB — CYTOLOGY - PAP
Diagnosis: NEGATIVE
HPV: NOT DETECTED

## 2019-06-26 ENCOUNTER — Encounter: Payer: Self-pay | Admitting: Internal Medicine

## 2019-06-26 DIAGNOSIS — M6281 Muscle weakness (generalized): Secondary | ICD-10-CM | POA: Diagnosis not present

## 2019-06-26 DIAGNOSIS — M5416 Radiculopathy, lumbar region: Secondary | ICD-10-CM | POA: Diagnosis not present

## 2019-07-01 DIAGNOSIS — M6281 Muscle weakness (generalized): Secondary | ICD-10-CM | POA: Diagnosis not present

## 2019-07-01 DIAGNOSIS — M5416 Radiculopathy, lumbar region: Secondary | ICD-10-CM | POA: Diagnosis not present

## 2019-07-03 DIAGNOSIS — M5416 Radiculopathy, lumbar region: Secondary | ICD-10-CM | POA: Diagnosis not present

## 2019-07-03 DIAGNOSIS — M6281 Muscle weakness (generalized): Secondary | ICD-10-CM | POA: Diagnosis not present

## 2019-07-04 ENCOUNTER — Other Ambulatory Visit (INDEPENDENT_AMBULATORY_CARE_PROVIDER_SITE_OTHER): Payer: BC Managed Care – PPO

## 2019-07-04 ENCOUNTER — Other Ambulatory Visit: Payer: Self-pay

## 2019-07-04 DIAGNOSIS — E039 Hypothyroidism, unspecified: Secondary | ICD-10-CM | POA: Diagnosis not present

## 2019-07-04 DIAGNOSIS — G40309 Generalized idiopathic epilepsy and epileptic syndromes, not intractable, without status epilepticus: Secondary | ICD-10-CM

## 2019-07-04 DIAGNOSIS — R739 Hyperglycemia, unspecified: Secondary | ICD-10-CM | POA: Diagnosis not present

## 2019-07-04 DIAGNOSIS — I10 Essential (primary) hypertension: Secondary | ICD-10-CM

## 2019-07-04 DIAGNOSIS — E7800 Pure hypercholesterolemia, unspecified: Secondary | ICD-10-CM

## 2019-07-04 LAB — BASIC METABOLIC PANEL
BUN: 17 mg/dL (ref 6–23)
CO2: 30 mEq/L (ref 19–32)
Calcium: 9.4 mg/dL (ref 8.4–10.5)
Chloride: 104 mEq/L (ref 96–112)
Creatinine, Ser: 0.87 mg/dL (ref 0.40–1.20)
GFR: 67.01 mL/min (ref >60.00)
Glucose, Bld: 108 mg/dL — ABNORMAL HIGH (ref 70–99)
Potassium: 3.8 mEq/L (ref 3.5–5.1)
Sodium: 143 mEq/L (ref 135–145)

## 2019-07-04 LAB — HEPATIC FUNCTION PANEL
ALT: 20 U/L (ref 0–35)
AST: 16 U/L (ref 0–37)
Albumin: 4.6 g/dL (ref 3.5–5.2)
Alkaline Phosphatase: 59 U/L (ref 39–117)
Bilirubin, Direct: 0.1 mg/dL (ref 0.0–0.3)
Total Bilirubin: 0.4 mg/dL (ref 0.2–1.2)
Total Protein: 6.8 g/dL (ref 6.0–8.3)

## 2019-07-04 LAB — LIPID PANEL
Cholesterol: 203 mg/dL — ABNORMAL HIGH (ref 0–200)
HDL: 59.3 mg/dL (ref >39.00)
LDL Cholesterol: 116 mg/dL — ABNORMAL HIGH (ref 0–99)
NonHDL: 143.91
Total CHOL/HDL Ratio: 3
Triglycerides: 142 mg/dL (ref 0.0–149.0)
VLDL: 28.4 mg/dL (ref 0.0–40.0)

## 2019-07-04 LAB — TSH: TSH: 1.93 u[IU]/mL (ref 0.35–4.50)

## 2019-07-04 LAB — HEMOGLOBIN A1C: Hgb A1c MFr Bld: 5.2 % (ref 4.6–6.5)

## 2019-07-05 ENCOUNTER — Ambulatory Visit
Admission: RE | Admit: 2019-07-05 | Discharge: 2019-07-05 | Disposition: A | Discharge status: Home / Self Care | Payer: BC Managed Care – PPO | Source: Ambulatory Visit | Attending: Internal Medicine | Admitting: Internal Medicine

## 2019-07-05 DIAGNOSIS — D0511 Intraductal carcinoma in situ of right breast: Secondary | ICD-10-CM

## 2019-07-05 DIAGNOSIS — R928 Other abnormal and inconclusive findings on diagnostic imaging of breast: Secondary | ICD-10-CM | POA: Diagnosis not present

## 2019-07-05 LAB — CARBAMAZEPINE LEVEL, TOTAL: Carbamazepine Lvl: 5.6 mg/L (ref 4.0–12.0)

## 2019-07-08 DIAGNOSIS — M5416 Radiculopathy, lumbar region: Secondary | ICD-10-CM | POA: Diagnosis not present

## 2019-07-08 DIAGNOSIS — M6281 Muscle weakness (generalized): Secondary | ICD-10-CM | POA: Diagnosis not present

## 2019-07-10 ENCOUNTER — Other Ambulatory Visit: Payer: Self-pay | Admitting: Physical Medicine and Rehabilitation

## 2019-07-10 DIAGNOSIS — M5416 Radiculopathy, lumbar region: Secondary | ICD-10-CM

## 2019-07-10 DIAGNOSIS — M6281 Muscle weakness (generalized): Secondary | ICD-10-CM | POA: Diagnosis not present

## 2019-07-12 ENCOUNTER — Other Ambulatory Visit: Payer: Self-pay | Admitting: Internal Medicine

## 2019-07-24 ENCOUNTER — Ambulatory Visit: Payer: BLUE CROSS/BLUE SHIELD | Admitting: Surgery

## 2019-08-08 ENCOUNTER — Other Ambulatory Visit: Payer: Self-pay

## 2019-08-08 ENCOUNTER — Ambulatory Visit
Admission: RE | Admit: 2019-08-08 | Discharge: 2019-08-08 | Disposition: A | Discharge status: Home / Self Care | Payer: BC Managed Care – PPO | Source: Ambulatory Visit | Attending: Physical Medicine and Rehabilitation | Admitting: Physical Medicine and Rehabilitation

## 2019-08-08 DIAGNOSIS — M47816 Spondylosis without myelopathy or radiculopathy, lumbar region: Secondary | ICD-10-CM | POA: Diagnosis not present

## 2019-08-08 DIAGNOSIS — M48061 Spinal stenosis, lumbar region without neurogenic claudication: Secondary | ICD-10-CM | POA: Diagnosis not present

## 2019-08-08 DIAGNOSIS — M5416 Radiculopathy, lumbar region: Secondary | ICD-10-CM

## 2019-08-12 DIAGNOSIS — M5416 Radiculopathy, lumbar region: Secondary | ICD-10-CM | POA: Diagnosis not present

## 2019-09-03 DIAGNOSIS — M5416 Radiculopathy, lumbar region: Secondary | ICD-10-CM | POA: Diagnosis not present

## 2019-09-05 ENCOUNTER — Other Ambulatory Visit: Payer: Self-pay | Admitting: Internal Medicine

## 2019-09-09 ENCOUNTER — Encounter: Payer: Self-pay | Admitting: Internal Medicine

## 2019-09-11 ENCOUNTER — Other Ambulatory Visit: Payer: Self-pay

## 2019-09-11 MED ORDER — DICLOFENAC SODIUM 75 MG PO TBEC: 75.0000 mg | DELAYED_RELEASE_TABLET | Freq: Two times a day (BID) | ORAL | 0 refills | Status: DC

## 2019-09-11 NOTE — Telephone Encounter (Signed)
Are you ok with refilling her diclofenac as a one time refill? She has been trying to get in touch with ortho for a week.

## 2019-09-11 NOTE — Telephone Encounter (Signed)
Correction: sent in Diclofenac 75 mg #60 no refills.

## 2019-09-11 NOTE — Telephone Encounter (Signed)
Dr Mina Marble at [facility] is who she sees. She is taking diclofenac 75 mg bid. Sent in to [facility] in [place] #60 bid

## 2019-09-11 NOTE — Telephone Encounter (Signed)
Ok to refill x 1 month.  Please have her continue to contact them for more refills.  Also, document who her orthopedist is, just so we will have contact information.  Confirm dose taking.  Thanks

## 2019-09-20 ENCOUNTER — Ambulatory Visit: Payer: BC Managed Care – PPO | Admitting: Radiation Oncology

## 2019-09-27 DIAGNOSIS — M5416 Radiculopathy, lumbar region: Secondary | ICD-10-CM | POA: Diagnosis not present

## 2019-10-04 ENCOUNTER — Other Ambulatory Visit: Payer: Self-pay | Admitting: Internal Medicine

## 2019-10-18 ENCOUNTER — Encounter: Payer: Self-pay | Admitting: Internal Medicine

## 2019-10-18 NOTE — Telephone Encounter (Signed)
Reviewed pictures.  Please confirm with pt no swelling.  She reports no pain now.  Confirm no weakness.  Any previous injury - muscle tear or injury.  Appears to be a large hematoma.  Confirm no increased warmth.  If any acute symptoms, needs to be evaluated.  Follow and give up an update.  Can schedule appt if needed.

## 2019-10-18 NOTE — Telephone Encounter (Signed)
Does not remember hitting hit just last week she said it just felt like a muscle cramp in the front anda week later she noticed the bruising.  Patient says it is warm so but no pain unless she presses on it. Patient would like to wait she feels it is getting better will call us Monday if she feels she needs to be seen or if worsens over the weekend she will go to UC advised increases in size , swelling , pain , or heat needs to be seen ASAP.

## 2019-10-20 ENCOUNTER — Encounter: Payer: Self-pay | Admitting: Internal Medicine

## 2019-10-21 NOTE — Telephone Encounter (Signed)
From what I can see on the picture, it does look better.  Hematoma resolving.  Any pain now?  She was questioning ultrasound, etc - would have to see it to know for sure if any other testing warranted.  Confirm no pain or swelling.

## 2019-10-21 NOTE — Telephone Encounter (Signed)
Patient confirmed no pain, swelling. Patient also stated that she is not really concerned. She is going to let her husband look at it tonight but it is just like a bruise that came up and is now healing. Advised patient to let me know if she needs anything.

## 2019-11-01 DIAGNOSIS — M7631 Iliotibial band syndrome, right leg: Secondary | ICD-10-CM | POA: Diagnosis not present

## 2019-11-06 ENCOUNTER — Ambulatory Visit: Payer: BC Managed Care – PPO | Attending: Physical Medicine and Rehabilitation | Admitting: Physical Therapy

## 2019-11-06 ENCOUNTER — Other Ambulatory Visit: Payer: Self-pay

## 2019-11-06 ENCOUNTER — Encounter: Payer: Self-pay | Admitting: Physical Therapy

## 2019-11-06 DIAGNOSIS — M25551 Pain in right hip: Secondary | ICD-10-CM | POA: Diagnosis not present

## 2019-11-06 DIAGNOSIS — R262 Difficulty in walking, not elsewhere classified: Secondary | ICD-10-CM | POA: Diagnosis not present

## 2019-11-06 NOTE — Patient Instructions (Signed)
Access Code: [email or web address]  URL: [email or web address]  Date: 11/06/2019  Prepared by: Lum Babe   Exercises Seated Hamstring Stretch with Chair - 5 reps - 1 sets - 30 hold - 2x daily - 7x weekly Supine Hip Adductor Stretch - 5 reps - 1 sets - 20 hold - 2x daily - 7x weekly Supine Piriformis Stretch Pulling Heel to Hip - 5 reps - 1 sets - 30 hold - 2x daily - 7x weekly Supine ITB Stretch with Strap - 5 reps - 1 sets - 20 hold - 2x daily - 7x weekly Supine Quadriceps Stretch with Strap on Table - 5 reps - 1 sets - 20 hold - 2x daily - 7x weekly

## 2019-11-06 NOTE — Therapy (Signed)
[address] [place], [place], [address] Phone: [phone]   Fax:  [phone]  Physical Therapy Evaluation  Patient Details  Name: ZAYAH CATOE MRN: [id number] Date of Birth: 04-Jun-1961 Referring Provider (PT): Normajean Glasgow   Encounter Date: 11/06/2019  PT End of Session - 11/06/19 0843    Visit Number  1    Date for PT Re-Evaluation  01/07/20    PT Start Time  0745    PT Stop Time  0844    PT Time Calculation (min)  59 min    Activity Tolerance  Patient limited by pain    Behavior During Therapy  [facility] for tasks assessed/performed       Past Medical History:  Diagnosis Date  . Arthritis   . Breast cancer ([place]) 06/28/2017   Grade 3, DCIS, 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  . Hypercholesterolemia   . Hypertension   . Hypothyroidism   . 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 Sleep apnea    USES CPAP  . Ulcer    Gastric    Past Surgical History:  Procedure Laterality Date  . BREAST BIOPSY Right 06/28/2017   Stereo affirm- UOQ/DUCTAL CARCINOMA IN SITU, HIGH NUCLEAR GRADE WITH COMEDONECROSIS   . BREAST EXCISIONAL BIOPSY Right 07/17/2017   WIde excision upper outer quadrant DCIS  Dr. Bary Castilla  . BREAST LUMPECTOMY Right 07/17/2017   DCIS mammosite  . CESAREAN SECTION    . COLONOSCOPY N/A 10/05/2016   Procedure: COLONOSCOPY;  Surgeon: Manya Silvas, MD;  Location: [facility] ENDOSCOPY;  Service: Endoscopy;  Laterality: N/A;  Zosyn IVPB  . JOINT REPLACEMENT    . MASTECTOMY, PARTIAL Right 07/17/2017   Procedure: MASTECTOMY PARTIAL;  Surgeon: Robert Bellow, MD;  Location: [facility] ORS;  Service: General;  Laterality: Right;  . thumb surgery Right 2005   AND CTR  . THYROIDECTOMY  1988  . TOTAL KNEE ARTHROPLASTY Left 2012    There were no vitals filed for this visit.   Subjective Assessment - 11/06/19 0748    Subjective  Patient reports that she was doing exercises in January  and February with some weights and a glider, she thinks she overdid it, She reports Covid happened and then she really started to have right hip pain significantly and now starting to have some left hip pain, she does reports some DDD and stenosis.  She reports that because she is limping her sciatic nerve is hurting    Pertinent History  left TKA    Limitations  Lifting;Standing;Walking    How long can you stand comfortably?  difficulty > 10 minutes    How long can you walk comfortably?  had to use a cane earlier in the summer    Patient Stated Goals  have less pain, walk better    Currently in Pain?  Yes    Pain Score  3     Pain Location  Hip    Pain Orientation  Right    Pain Descriptors / Indicators  Aching;Tightness    Pain Type  Acute pain    Pain Radiating Towards  down the right laterl thigh    Pain Onset  More than a month ago    Pain Frequency  Constant    Aggravating Factors   walking, standing, as the day goes on pain up to 8/10    Pain Relieving Factors  sit rest, gabapentin, heat, pain  can 2/10 at best    Effect of Pain on Daily Activities  limits standing, walking, no longer can exercise, can't do yardwork         [facility] PT Assessment - 11/06/19 0001      Assessment   Medical Diagnosis  right hip and left hip pain    Referring Provider (PT)  Normajean Glasgow    Onset Date/Surgical Date  10/07/19    Prior Therapy  no      Precautions   Precautions  None      Balance Screen   Has the patient fallen in the past 6 months  No    Has the patient had a decrease in activity level because of a fear of falling?   No    Is the patient reluctant to leave their home because of a fear of falling?   No      Home Environment   Additional Comments  has stairs, was doing yardwork      Prior Function   Level of Independence  Independent    Vocation  Full time employment    Vocation Requirements  accounting    Leisure  rides motorcycles      ROM / Strength   AROM / PROM / Strength   AROM;Strength      AROM   Overall AROM Comments  right hip ER is very limited, extension very limited due to tightness and pain.      Strength   Overall Strength Comments  hips 4-/5 with some pain in the hips and the groin      Flexibility   Soft Tissue Assessment /Muscle Length  yes    Hamstrings  painful but fair flexibility    Quadriceps  very tight and painful    ITB  very tight and painful    Piriformis  very tight and painful also tight and painful adductors      Palpation   Palpation comment  she is very tight and tender in the right piriformis, right groin, right GT and ITB areas      Ambulation/Gait   Gait Comments  as a significant limp on the right, no device, reports difficulty with stairs                Objective measurements completed on examination: See above findings.      [facility] Adult PT Treatment/Exercise - 11/06/19 0001      Modalities   Modalities  Iontophoresis      Iontophoresis   Type of Iontophoresis  Dexamethasone    Location  right GT area    Dose  46mA    Time  4 hour patch               PT Short Term Goals - 11/06/19 0917      PT SHORT TERM GOAL #1   Title  independent with initial HEP    Time  2    Period  Weeks    Status  New        PT Long Term Goals - 11/06/19 0917      PT LONG TERM GOAL #1   Title  decrease pain 50%    Time  8    Period  Weeks    Status  New      PT LONG TERM GOAL #2   Title  walk without limp    Time  8    Period  Weeks    Status  New      PT LONG TERM GOAL #3   Title  understand posture and body mechanics    Time  8    Period  Weeks    Status  New      PT LONG TERM GOAL #4   Title  return to exercise without pain    Time  8    Period  Weeks    Status  New             Plan - 11/06/19 0843    Clinical Impression Statement  Patient feels like she started having right hip pain back in February, 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  She report sthat the pain has gotten worse, causing her to limp, have difficulty walking and doing other ADL's.  She reports that pain has been significant,  she is very tight in the LE mms with pain and tenderness, a lot of issues in the ITB and the groin.  Limited ER of the hip.  Significant limp on the right.    Stability/Clinical Decision Making  Stable/Uncomplicated    Rehab Potential  Good    PT Frequency  2x / week    PT Duration  8 weeks    PT Treatment/Interventions  ADLs/Self Care Home Management;Cryotherapy;Electrical Stimulation;Iontophoresis 4mg /ml Dexamethasone;Moist Heat;Ultrasound;Gait training;Therapeutic activities;Therapeutic exercise;Balance training;Neuromuscular re-education;Manual techniques;Patient/family education;Dry needling    PT Next Visit Plan  slowly start exercises and help with flexibility    Consulted and Agree with Plan of Care  Patient       Patient will benefit from skilled therapeutic intervention in order to improve the following deficits and impairments:  Abnormal gait, Pain, Increased muscle spasms, Decreased mobility, Decreased activity tolerance, Decreased range of motion, Decreased strength, Impaired flexibility, Difficulty walking  Visit Diagnosis: Pain in right hip - Plan: PT plan of care cert/re-cert  Difficulty in walking, not elsewhere classified - Plan: PT plan of care cert/re-cert     Problem List Patient Active Problem List   Diagnosis Date Noted  . Vaginal burning 06/22/2019  . Anxiety 06/19/2018  . Ductal carcinoma in situ (DCIS) of right breast 07/08/2017  . Stress 04/09/2017  . Health care maintenance 06/14/2015  . Numbness of foot 06/09/2014  . Neck nodule 12/29/2013  . 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 Simple partial seizures ([place]) 09/11/2013  . Encounter for therapeutic drug monitoring 09/11/2013  . Hypokalemia 06/24/2013  . Essential hypertension, benign 01/11/2013  .  Hypercholesterolemia 01/11/2013  . Seizure disorder ([place]) 01/11/2013  . Gastric ulcer 01/11/2013  . Hypothyroidism 01/11/2013  . Obstructive sleep apnea 01/11/2013    Sumner Boast., PT 11/06/2019, 9:27 AM  [facility] [id number] [facility] [address] [address] [place], [place], [address] Phone: [phone]   Fax:  [phone]  Name: GABRIELLAH LAMKE MRN: [id number] Date of Birth: 1961/11/22

## 2019-11-11 ENCOUNTER — Ambulatory Visit: Payer: BC Managed Care – PPO

## 2019-11-12 ENCOUNTER — Ambulatory Visit: Payer: BC Managed Care – PPO | Admitting: Physical Therapy

## 2019-11-12 ENCOUNTER — Other Ambulatory Visit: Payer: Self-pay

## 2019-11-12 DIAGNOSIS — M25551 Pain in right hip: Secondary | ICD-10-CM

## 2019-11-12 DIAGNOSIS — R262 Difficulty in walking, not elsewhere classified: Secondary | ICD-10-CM | POA: Diagnosis not present

## 2019-11-12 NOTE — Therapy (Signed)
[address] [place], [place], [address] Phone: [phone]   Fax:  [phone]  Physical Therapy Treatment  Patient Details  Name: Jennifer Pratt MRN: [id number] Date of Birth: 01-30-1961 Referring Provider (PT): Normajean Glasgow   Encounter Date: 11/12/2019  PT End of Session - 11/12/19 0931    Visit Number  2    Date for PT Re-Evaluation  01/07/20    PT Start Time  0846    PT Stop Time  0930    PT Time Calculation (min)  44 min       Past Medical History:  Diagnosis Date  . Arthritis   . Breast cancer ([place]) 06/28/2017   Grade 3, DCIS, 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  . Hypercholesterolemia   . Hypertension   . Hypothyroidism   . 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 Sleep apnea    USES CPAP  . Ulcer    Gastric    Past Surgical History:  Procedure Laterality Date  . BREAST BIOPSY Right 06/28/2017   Stereo affirm- UOQ/DUCTAL CARCINOMA IN SITU, HIGH NUCLEAR GRADE WITH COMEDONECROSIS   . BREAST EXCISIONAL BIOPSY Right 07/17/2017   WIde excision upper outer quadrant DCIS  Dr. Bary Castilla  . BREAST LUMPECTOMY Right 07/17/2017   DCIS mammosite  . CESAREAN SECTION    . COLONOSCOPY N/A 10/05/2016   Procedure: COLONOSCOPY;  Surgeon: Manya Silvas, MD;  Location: [facility] ENDOSCOPY;  Service: Endoscopy;  Laterality: N/A;  Zosyn IVPB  . JOINT REPLACEMENT    . MASTECTOMY, PARTIAL Right 07/17/2017   Procedure: MASTECTOMY PARTIAL;  Surgeon: Robert Bellow, MD;  Location: [facility] ORS;  Service: General;  Laterality: Right;  . thumb surgery Right 2005   AND CTR  . THYROIDECTOMY  1988  . TOTAL KNEE ARTHROPLASTY Left 2012    There were no vitals filed for this visit.  Subjective Assessment - 11/12/19 0849    Subjective  "feeling okay, pain patch seems to help"    Currently in Pain?  Yes    Pain Score  5     Pain Location  Hip                       [facility] Adult PT  Treatment/Exercise - 11/12/19 0001      Exercises   Exercises  Knee/Hip      Knee/Hip Exercises: Stretches   Quad Stretch  Both;5 reps;20 seconds    ITB Stretch  Both;5 reps;20 seconds    Piriformis Stretch  Both;5 reps;20 seconds    Other Knee/Hip Stretches  hip adductor stretch both, 5 reps, 20 sec      Knee/Hip Exercises: Aerobic   Nustep  L4 x 5 min      Iontophoresis   Type of Iontophoresis  Dexamethasone    Location  right GT area    Dose  20mA    Time  4 hour patch               PT Short Term Goals - 11/06/19 0917      PT SHORT TERM GOAL #1   Title  independent with initial HEP    Time  2    Period  Weeks    Status  New        PT Long Term Goals - 11/06/19 [id number]      PT LONG TERM GOAL #1   Title  decrease pain 50%    Time  8    Period  Weeks    Status  New      PT LONG TERM GOAL #2   Title  walk without limp    Time  8    Period  Weeks    Status  New      PT LONG TERM GOAL #3   Title  understand posture and body mechanics    Time  8    Period  Weeks    Status  New      PT LONG TERM GOAL #4   Title  return to exercise without pain    Time  8    Period  Weeks    Status  New            Plan - 11/12/19 [id number]    Clinical Impression Statement  pt tolerated treatment well as demonstrated by no increase of pain with interventions. pt is tight with piriformis, ITB, adductors, and quads. pt able to lay in prone for quad stretch but only for a short time. she is tighter in the right leg than the left. she agreed to try ionto again as it seems to help relieve her pain.    Stability/Clinical Decision Making  Stable/Uncomplicated    Rehab Potential  Good    PT Frequency  2x / week    PT Duration  8 weeks    PT Treatment/Interventions  ADLs/Self Care Home Management;Cryotherapy;Electrical Stimulation;Iontophoresis 4mg /ml Dexamethasone;Moist Heat;Ultrasound;Gait training;Therapeutic activities;Therapeutic exercise;Balance training;Neuromuscular  re-education;Manual techniques;Patient/family education;Dry needling    PT Next Visit Plan  slowly start exercises and help with flexibility    Consulted and Agree with Plan of Care  Patient       Patient will benefit from skilled therapeutic intervention in order to improve the following deficits and impairments:  Abnormal gait, Pain, Increased muscle spasms, Decreased mobility, Decreased activity tolerance, Decreased range of motion, Decreased strength, Impaired flexibility, Difficulty walking  Visit Diagnosis: Pain in right hip     Problem List Patient Active Problem List   Diagnosis Date Noted  . Vaginal burning 06/22/2019  . Anxiety 06/19/2018  . Ductal carcinoma in situ (DCIS) of right breast 07/08/2017  . Stress 04/09/2017  . Health care maintenance 06/14/2015  . Numbness of foot 06/09/2014  . Neck nodule 12/29/2013  . 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 Simple partial seizures ([place]) 09/11/2013  . Encounter for therapeutic drug monitoring 09/11/2013  . Hypokalemia 06/24/2013  . Essential hypertension, benign 01/11/2013  . Hypercholesterolemia 01/11/2013  . Seizure disorder ([place]) 01/11/2013  . Gastric ulcer 01/11/2013  . Hypothyroidism 01/11/2013  . Obstructive sleep apnea 01/11/2013    Barrett Henle, [place] 11/12/2019, 9:38 AM  [address] [place], [place], [address] Phone: [phone]   Fax:  [phone]  Name: Jennifer Pratt MRN: [id number] Date of Birth: 1961-10-23

## 2019-11-13 ENCOUNTER — Ambulatory Visit: Payer: BC Managed Care – PPO | Admitting: Physical Therapy

## 2019-11-13 DIAGNOSIS — M25551 Pain in right hip: Secondary | ICD-10-CM | POA: Diagnosis not present

## 2019-11-13 DIAGNOSIS — R262 Difficulty in walking, not elsewhere classified: Secondary | ICD-10-CM | POA: Diagnosis not present

## 2019-11-13 NOTE — Therapy (Signed)
[address] [place], [place], [address] Phone: [phone]   Fax:  [phone]  Physical Therapy Treatment  Patient Details  Name: Jennifer Pratt MRN: [id number] Date of Birth: May 29, 1961 Referring Provider (PT): Normajean Glasgow   Encounter Date: 11/13/2019  PT End of Session - 11/13/19 1456    Visit Number  3    Date for PT Re-Evaluation  01/07/20    PT Start Time  0213    PT Stop Time  0255    PT Time Calculation (min)  42 min       Past Medical History:  Diagnosis Date  . Arthritis   . Breast cancer ([place]) 06/28/2017   Grade 3, DCIS, 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  . Hypercholesterolemia   . Hypertension   . Hypothyroidism   . 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 Sleep apnea    USES CPAP  . Ulcer    Gastric    Past Surgical History:  Procedure Laterality Date  . BREAST BIOPSY Right 06/28/2017   Stereo affirm- UOQ/DUCTAL CARCINOMA IN SITU, HIGH NUCLEAR GRADE WITH COMEDONECROSIS   . BREAST EXCISIONAL BIOPSY Right 07/17/2017   WIde excision upper outer quadrant DCIS  Dr. Bary Castilla  . BREAST LUMPECTOMY Right 07/17/2017   DCIS mammosite  . CESAREAN SECTION    . COLONOSCOPY N/A 10/05/2016   Procedure: COLONOSCOPY;  Surgeon: Manya Silvas, MD;  Location: [facility] ENDOSCOPY;  Service: Endoscopy;  Laterality: N/A;  Zosyn IVPB  . JOINT REPLACEMENT    . MASTECTOMY, PARTIAL Right 07/17/2017   Procedure: MASTECTOMY PARTIAL;  Surgeon: Robert Bellow, MD;  Location: [facility] ORS;  Service: General;  Laterality: Right;  . thumb surgery Right 2005   AND CTR  . THYROIDECTOMY  1988  . TOTAL KNEE ARTHROPLASTY Left 2012    There were no vitals filed for this visit.  Subjective Assessment - 11/13/19 1418    Subjective  "feeling okay"    Currently in Pain?  Yes    Pain Score  5     Pain Location  Hip    Pain Orientation  Right                       [facility] Adult  PT Treatment/Exercise - 11/13/19 0001      Knee/Hip Exercises: Stretches   ITB Stretch  Both;5 reps;20 seconds    Piriformis Stretch  Both;5 reps;20 seconds      Knee/Hip Exercises: Aerobic   Nustep  L5 x 5 min      Knee/Hip Exercises: Standing   Hip Abduction  Stengthening;Both;20 reps;Knee straight    Hip Extension  Stengthening;Both;20 reps;Knee straight   2.5#   Other Standing Knee Exercises  dead lifts 3#, 2 x 10      Iontophoresis   Type of Iontophoresis  Dexamethasone    Location  right GT area    Dose  53mA    Time  4 hour patch               PT Short Term Goals - 11/13/19 1417      PT SHORT TERM GOAL #1   Title  independent with initial HEP    Status  Achieved        PT Long Term Goals - 11/13/19 1418      PT LONG TERM GOAL #1   Title  decrease pain 50%  Status  On-going      PT LONG TERM GOAL #2   Title  walk without limp    Status  On-going      PT LONG TERM GOAL #3   Title  understand posture and body mechanics    Status  On-going      PT LONG TERM GOAL #4   Title  return to exercise without pain    Status  On-going            Plan - 11/13/19 1457    Clinical Impression Statement  pt was progressed with exercises. pt needs tactile and verbal cues to improve form and technique of exercises. pt is still tight with stretching. she is tighter in left leg with ITB stretch. ionto patch was placed on the right hip.    Rehab Potential  Good    PT Frequency  2x / week    PT Duration  8 weeks    PT Treatment/Interventions  ADLs/Self Care Home Management;Cryotherapy;Electrical Stimulation;Iontophoresis 4mg /ml Dexamethasone;Moist Heat;Ultrasound;Gait training;Therapeutic activities;Therapeutic exercise;Balance training;Neuromuscular re-education;Manual techniques;Patient/family education;Dry needling    PT Next Visit Plan  slowly start exercises and help with flexibility    Consulted and Agree with Plan of Care  Patient       Patient will  benefit from skilled therapeutic intervention in order to improve the following deficits and impairments:  Abnormal gait, Pain, Increased muscle spasms, Decreased mobility, Decreased activity tolerance, Decreased range of motion, Decreased strength, Impaired flexibility, Difficulty walking  Visit Diagnosis: Pain in right hip     Problem List Patient Active Problem List   Diagnosis Date Noted  . Vaginal burning 06/22/2019  . Anxiety 06/19/2018  . Ductal carcinoma in situ (DCIS) of right breast 07/08/2017  . Stress 04/09/2017  . Health care maintenance 06/14/2015  . Numbness of foot 06/09/2014  . Neck nodule 12/29/2013  . 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 Simple partial seizures ([place]) 09/11/2013  . Encounter for therapeutic drug monitoring 09/11/2013  . Hypokalemia 06/24/2013  . Essential hypertension, benign 01/11/2013  . Hypercholesterolemia 01/11/2013  . Seizure disorder ([place]) 01/11/2013  . Gastric ulcer 01/11/2013  . Hypothyroidism 01/11/2013  . Obstructive sleep apnea 01/11/2013    Barrett Henle, [place] 11/13/2019, 3:02 PM  [address] [place], [place], [address] Phone: [phone]   Fax:  [phone]  Name: Jennifer Pratt MRN: [id number] Date of Birth: 07-04-1961

## 2019-11-19 ENCOUNTER — Other Ambulatory Visit: Payer: Self-pay

## 2019-11-19 ENCOUNTER — Ambulatory Visit: Payer: BC Managed Care – PPO | Admitting: Physical Therapy

## 2019-11-19 DIAGNOSIS — R262 Difficulty in walking, not elsewhere classified: Secondary | ICD-10-CM | POA: Diagnosis not present

## 2019-11-19 DIAGNOSIS — M25551 Pain in right hip: Secondary | ICD-10-CM | POA: Diagnosis not present

## 2019-11-19 NOTE — Therapy (Signed)
[address] [place], [place], [address] Phone: [phone]   Fax:  [phone]  Physical Therapy Treatment  Patient Details  Name: Jennifer Pratt MRN: [id number] Date of Birth: [birth year] Referring Provider (PT): Normajean Glasgow   Encounter Date: 11/19/2019  PT End of Session - 11/19/19 0918    Visit Number  4    Date for PT Re-Evaluation  01/07/20    PT Start Time  0845    PT Stop Time  0933    PT Time Calculation (min)  48 min       Past Medical History:  Diagnosis Date  . Arthritis   . Breast cancer ([place]) 06/28/2017   Grade 3, DCIS, 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  . Hypercholesterolemia   . Hypertension   . Hypothyroidism   . 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 Sleep apnea    USES CPAP  . Ulcer    Gastric    Past Surgical History:  Procedure Laterality Date  . BREAST BIOPSY Right 06/28/2017   Stereo affirm- UOQ/DUCTAL CARCINOMA IN SITU, HIGH NUCLEAR GRADE WITH COMEDONECROSIS   . BREAST EXCISIONAL BIOPSY Right 07/17/2017   WIde excision upper outer quadrant DCIS  Dr. Bary Castilla  . BREAST LUMPECTOMY Right 07/17/2017   DCIS mammosite  . CESAREAN SECTION    . COLONOSCOPY N/A 10/05/2016   Procedure: COLONOSCOPY;  Surgeon: Manya Silvas, MD;  Location: [facility] ENDOSCOPY;  Service: Endoscopy;  Laterality: N/A;  Zosyn IVPB  . JOINT REPLACEMENT    . MASTECTOMY, PARTIAL Right 07/17/2017   Procedure: MASTECTOMY PARTIAL;  Surgeon: Robert Bellow, MD;  Location: [facility] ORS;  Service: General;  Laterality: Right;  . thumb surgery Right 2005   AND CTR  . THYROIDECTOMY  1988  . TOTAL KNEE ARTHROPLASTY Left 2012    There were no vitals filed for this visit.  Subjective Assessment - 11/19/19 0846    Subjective  arthritis flair in back . stetching heps the most. pt amb stiff thru RT hip with increased trunk rotation    Currently in Pain?  Yes    Pain Score  5     Pain  Location  Hip    Pain Orientation  Right                       [facility] Adult PT Treatment/Exercise - 11/19/19 0001      Knee/Hip Exercises: Aerobic   Nustep  L5 x 5 min      Iontophoresis   Type of Iontophoresis  Dexamethasone    Location  right GT area    Dose  41mA    Time  4 hour patch      Manual Therapy   Manual Therapy  Passive ROM;Soft tissue mobilization;Other (comment)   ITB stripping on RT    Soft tissue mobilization  BIL IT and quad    Passive ROM  BIL LE and trunk    Other Manual Therapy  lareg knot in ITB, very deep             PT Education - 11/19/19 0921    Education Details  ppt    Person(s) Educated  Patient    Methods  Explanation;Demonstration    Comprehension  Verbalized understanding;Returned demonstration       PT Short Term Goals - 11/13/19 1417      PT SHORT TERM GOAL #1  Title  independent with initial HEP    Status  Achieved        PT Long Term Goals - 11/13/19 1418      PT LONG TERM GOAL #1   Title  decrease pain 50%    Status  On-going      PT LONG TERM GOAL #2   Title  walk without limp    Status  On-going      PT LONG TERM GOAL #3   Title  understand posture and body mechanics    Status  On-going      PT LONG TERM GOAL #4   Title  return to exercise without pain    Status  On-going            Plan - 11/19/19 0919    Clinical Impression Statement  aggressive PROM and stretch to BIL LE and trunk with focus on RT ITB, pt verb it felt better. very limited ROM in RT hip compromising mvmt and gait- will need to add in core stab ex of trunk once hip/ITB calms down    PT Treatment/Interventions  ADLs/Self Care Home Management;Cryotherapy;Electrical Stimulation;Iontophoresis 4mg /ml Dexamethasone;Moist Heat;Ultrasound;Gait training;Therapeutic activities;Therapeutic exercise;Balance training;Neuromuscular re-education;Manual techniques;Patient/family education;Dry needling    PT Next Visit Plan  assess STW  and DN and progress, add in core stab       Patient will benefit from skilled therapeutic intervention in order to improve the following deficits and impairments:  Abnormal gait, Pain, Increased muscle spasms, Decreased mobility, Decreased activity tolerance, Decreased range of motion, Decreased strength, Impaired flexibility, Difficulty walking  Visit Diagnosis: Difficulty in walking, not elsewhere classified  Pain in right hip     Problem List Patient Active Problem List   Diagnosis Date Noted  . Vaginal burning 06/22/2019  . Anxiety 06/19/2018  . Ductal carcinoma in situ (DCIS) of right breast 07/08/2017  . Stress 04/09/2017  . Health care maintenance 06/14/2015  . Numbness of foot 06/09/2014  . Neck nodule 12/29/2013  . 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 Simple partial seizures ([place]) 09/11/2013  . Encounter for therapeutic drug monitoring 09/11/2013  . Hypokalemia 06/24/2013  . Essential hypertension, benign 01/11/2013  . Hypercholesterolemia 01/11/2013  . Seizure disorder ([place]) 01/11/2013  . Gastric ulcer 01/11/2013  . Hypothyroidism 01/11/2013  . Obstructive sleep apnea 01/11/2013    Anabeth Chilcott,Jennifer Pratt PTA 11/19/2019, 9:22 AM  [address] [place], [place], [address] Phone: [phone]   Fax:  [phone]  Name: Jennifer Pratt MRN: [id number] Date of Birth: [birth year]

## 2019-11-26 ENCOUNTER — Encounter: Payer: Self-pay | Admitting: Physical Therapy

## 2019-11-26 ENCOUNTER — Ambulatory Visit: Payer: BC Managed Care – PPO | Admitting: Physical Therapy

## 2019-11-26 ENCOUNTER — Other Ambulatory Visit: Payer: Self-pay

## 2019-11-26 DIAGNOSIS — R262 Difficulty in walking, not elsewhere classified: Secondary | ICD-10-CM | POA: Diagnosis not present

## 2019-11-26 DIAGNOSIS — M25551 Pain in right hip: Secondary | ICD-10-CM

## 2019-11-26 NOTE — Therapy (Signed)
[address] [place], [place], [address] Phone: [phone]   Fax:  [phone]  Physical Therapy Treatment  Patient Details  Name: Jennifer Pratt MRN: [id number] Date of Birth: December 02, 1960 Referring Provider (PT): Normajean Glasgow   Encounter Date: 11/26/2019  PT End of Session - 11/26/19 1024    Visit Number  5    Date for PT Re-Evaluation  01/07/20    PT Start Time  0846    PT Stop Time  0933    PT Time Calculation (min)  47 min    Activity Tolerance  Patient tolerated treatment well    Behavior During Therapy  [facility] for tasks assessed/performed       Past Medical History:  Diagnosis Date  . Arthritis   . Breast cancer ([place]) 06/28/2017   Grade 3, DCIS, 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  . Hypercholesterolemia   . Hypertension   . Hypothyroidism   . 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 Sleep apnea    USES CPAP  . Ulcer    Gastric    Past Surgical History:  Procedure Laterality Date  . BREAST BIOPSY Right 06/28/2017   Stereo affirm- UOQ/DUCTAL CARCINOMA IN SITU, HIGH NUCLEAR GRADE WITH COMEDONECROSIS   . BREAST EXCISIONAL BIOPSY Right 07/17/2017   WIde excision upper outer quadrant DCIS  Dr. Bary Castilla  . BREAST LUMPECTOMY Right 07/17/2017   DCIS mammosite  . CESAREAN SECTION    . COLONOSCOPY N/A 10/05/2016   Procedure: COLONOSCOPY;  Surgeon: Manya Silvas, MD;  Location: [facility] ENDOSCOPY;  Service: Endoscopy;  Laterality: N/A;  Zosyn IVPB  . JOINT REPLACEMENT    . MASTECTOMY, PARTIAL Right 07/17/2017   Procedure: MASTECTOMY PARTIAL;  Surgeon: Robert Bellow, MD;  Location: [facility] ORS;  Service: General;  Laterality: Right;  . thumb surgery Right 2005   AND CTR  . THYROIDECTOMY  1988  . TOTAL KNEE ARTHROPLASTY Left 2012    There were no vitals filed for this visit.  Subjective Assessment - 11/26/19 1019    Subjective  Patient reports that the last treatment went  well.  , still stiff, but is walking better    Currently in Pain?  Yes    Pain Score  4     Pain Location  Hip    Pain Orientation  Right    Aggravating Factors   walking and standing                       [facility] Adult PT Treatment/Exercise - 11/26/19 0001      Iontophoresis   Type of Iontophoresis  Dexamethasone    Location  right GT area    Dose  30mA    Time  4 hour patch      Manual Therapy   Manual Therapy  Passive ROM;Soft tissue mobilization;Other (comment)    Soft tissue mobilization  BIL IT and quad and into the right calf area    Passive ROM  BIL LE and trunk all motions of the hip joint, very painful with hip extension, stretching quad and hip flexor    Other Manual Therapy  very tender all over the right LE        Trigger Point Dry Needling - 11/26/19 0001    Consent Given?  Yes    Education Handout Provided  Yes    Muscles Treated Lower Quadrant  Vastus lateralis;Hamstring  Vastus lateralis Response  Twitch response elicited    Hamstring Response  Twitch response elicited             PT Short Term Goals - 11/13/19 1417      PT SHORT TERM GOAL #1   Title  independent with initial HEP    Status  Achieved        PT Long Term Goals - 11/13/19 1418      PT LONG TERM GOAL #1   Title  decrease pain 50%    Status  On-going      PT LONG TERM GOAL #2   Title  walk without limp    Status  On-going      PT LONG TERM GOAL #3   Title  understand posture and body mechanics    Status  On-going      PT LONG TERM GOAL #4   Title  return to exercise without pain    Status  On-going            Plan - 11/26/19 1025    Clinical Impression Statement  The right hip musculature is very tight, the hip flexor and quad really are tight iwth some pain.  She has knots throughout the ITB and into the calf.  Walking better after the treatment    PT Next Visit Plan  contine to work on the mms and tightness work on her gait    Consulted and  Agree with Plan of Care  Patient       Patient will benefit from skilled therapeutic intervention in order to improve the following deficits and impairments:  Abnormal gait, Pain, Increased muscle spasms, Decreased mobility, Decreased activity tolerance, Decreased range of motion, Decreased strength, Impaired flexibility, Difficulty walking  Visit Diagnosis: Difficulty in walking, not elsewhere classified  Pain in right hip     Problem List Patient Active Problem List   Diagnosis Date Noted  . Vaginal burning 06/22/2019  . Anxiety 06/19/2018  . Ductal carcinoma in situ (DCIS) of right breast 07/08/2017  . Stress 04/09/2017  . Health care maintenance 06/14/2015  . Numbness of foot 06/09/2014  . Neck nodule 12/29/2013  . 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 Simple partial seizures ([place]) 09/11/2013  . Encounter for therapeutic drug monitoring 09/11/2013  . Hypokalemia 06/24/2013  . Essential hypertension, benign 01/11/2013  . Hypercholesterolemia 01/11/2013  . Seizure disorder ([place]) 01/11/2013  . Gastric ulcer 01/11/2013  . Hypothyroidism 01/11/2013  . Obstructive sleep apnea 01/11/2013    Sumner Boast., PT 11/26/2019, 10:28 AM  [facility] [address] [place], [place], [address] Phone: [phone]   Fax:  [phone]  Name: Jennifer Pratt MRN: [id number] Date of Birth: 1961/04/21

## 2019-12-03 ENCOUNTER — Other Ambulatory Visit: Payer: Self-pay

## 2019-12-03 ENCOUNTER — Ambulatory Visit: Payer: BC Managed Care – PPO | Attending: Physical Medicine and Rehabilitation | Admitting: Physical Therapy

## 2019-12-03 DIAGNOSIS — M25551 Pain in right hip: Secondary | ICD-10-CM | POA: Insufficient documentation

## 2019-12-03 DIAGNOSIS — R262 Difficulty in walking, not elsewhere classified: Secondary | ICD-10-CM | POA: Diagnosis not present

## 2019-12-03 NOTE — Therapy (Signed)
[address] [place], [place], [address] Phone: [phone]   Fax:  [phone]  Physical Therapy Treatment  Patient Details  Name: Jennifer Pratt MRN: [id number] Date of Birth: [birth year] Referring Provider (PT): Normajean Glasgow   Encounter Date: 12/03/2019  PT End of Session - 12/03/19 0915    Visit Number  6    Date for PT Re-Evaluation  01/07/20    PT Start Time  0846    PT Stop Time  0940    PT Time Calculation (min)  54 min       Past Medical History:  Diagnosis Date  . Arthritis   . Breast cancer ([place]) 06/28/2017   Grade 3, DCIS, 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  . Hypercholesterolemia   . Hypertension   . Hypothyroidism   . 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 Sleep apnea    USES CPAP  . Ulcer    Gastric    Past Surgical History:  Procedure Laterality Date  . BREAST BIOPSY Right 06/28/2017   Stereo affirm- UOQ/DUCTAL CARCINOMA IN SITU, HIGH NUCLEAR GRADE WITH COMEDONECROSIS   . BREAST EXCISIONAL BIOPSY Right 07/17/2017   WIde excision upper outer quadrant DCIS  Dr. Bary Castilla  . BREAST LUMPECTOMY Right 07/17/2017   DCIS mammosite  . CESAREAN SECTION    . COLONOSCOPY N/A 10/05/2016   Procedure: COLONOSCOPY;  Surgeon: Manya Silvas, MD;  Location: [facility] ENDOSCOPY;  Service: Endoscopy;  Laterality: N/A;  Zosyn IVPB  . JOINT REPLACEMENT    . MASTECTOMY, PARTIAL Right 07/17/2017   Procedure: MASTECTOMY PARTIAL;  Surgeon: Robert Bellow, MD;  Location: [facility] ORS;  Service: General;  Laterality: Right;  . thumb surgery Right 2005   AND CTR  . THYROIDECTOMY  1988  . TOTAL KNEE ARTHROPLASTY Left 2012    There were no vitals filed for this visit.  Subjective Assessment - 12/03/19 0906    Subjective  I think tx is helping but still pain is there and moves around    Currently in Pain?  Yes    Pain Score  5     Pain Location  Hip    Pain Orientation  Right                        [facility] Adult PT Treatment/Exercise - 12/03/19 0001      Knee/Hip Exercises: Supine   Other Supine Knee/Hip Exercises  core stab 10 min       Modalities   Modalities  Electrical Stimulation;Moist Heat      Moist Heat Therapy   Number Minutes Moist Heat  15 Minutes    Moist Heat Location  Hip      Electrical Stimulation   Electrical Stimulation Location  RT ITB    Electrical Stimulation Action  IFC    Electrical Stimulation Parameters  SL    Electrical Stimulation Goals  Pain   tightnes     Iontophoresis   Type of Iontophoresis  Dexamethasone    Location  right GT area    Dose  78mA    Time  4 hour patch      Manual Therapy   Manual Therapy  Passive ROM;Soft tissue mobilization;Other (comment)    Manual therapy comments  large TP RT distal IT, tenderness lat quad on RT and groin pain with rotation    Passive ROM  BIL LE and  trunk all motions of the hip joint, very painful with hip extension, stretching quad and hip flexor    Other Manual Therapy  very tender all over the right LE        Trigger Point Dry Needling - 12/03/19 0001    Consent Given?  Yes    Muscles Treated Lower Quadrant  Vastus lateralis    Vastus lateralis Response  Twitch response elicited           PT Education - 12/03/19 0915    Education Details  educ on using TENS at home 30 min 1-2 times per day at high intensity on RT hip/ITB    Person(s) Educated  Patient    Methods  Explanation    Comprehension  Verbalized understanding       PT Short Term Goals - 11/13/19 1417      PT SHORT TERM GOAL #1   Title  independent with initial HEP    Status  Achieved        PT Long Term Goals - 11/13/19 1418      PT LONG TERM GOAL #1   Title  decrease pain 50%    Status  On-going      PT LONG TERM GOAL #2   Title  walk without limp    Status  On-going      PT LONG TERM GOAL #3   Title  understand posture and body mechanics    Status  On-going      PT LONG  TERM GOAL #4   Title  return to exercise without pain    Status  On-going            Plan - 12/03/19 0915    Clinical Impression Statement  RT hip very tight esp hip flexor and rotation. overall decreased tissue density with STW but large deep TP noted in RT distal ITB. started core stab ex with some anterior hip/groin pain- cuing to engage core.    PT Treatment/Interventions  ADLs/Self Care Home Management;Cryotherapy;Electrical Stimulation;Iontophoresis 4mg /ml Dexamethasone;Moist Heat;Ultrasound;Gait training;Therapeutic activities;Therapeutic exercise;Balance training;Neuromuscular re-education;Manual techniques;Patient/family education;Dry needling    PT Next Visit Plan  contine to work on the mms and tightness work on her gait PLUS core stab- weak core and lumb stab are contributing to poor gait       Patient will benefit from skilled therapeutic intervention in order to improve the following deficits and impairments:  Abnormal gait, Pain, Increased muscle spasms, Decreased mobility, Decreased activity tolerance, Decreased range of motion, Decreased strength, Impaired flexibility, Difficulty walking  Visit Diagnosis: Difficulty in walking, not elsewhere classified  Pain in right hip     Problem List Patient Active Problem List   Diagnosis Date Noted  . Vaginal burning 06/22/2019  . Anxiety 06/19/2018  . Ductal carcinoma in situ (DCIS) of right breast 07/08/2017  . Stress 04/09/2017  . Health care maintenance 06/14/2015  . Numbness of foot 06/09/2014  . Neck nodule 12/29/2013  . 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 Simple partial seizures ([place]) 09/11/2013  . Encounter for therapeutic drug monitoring 09/11/2013  . Hypokalemia 06/24/2013  . Essential hypertension, benign 01/11/2013  . Hypercholesterolemia 01/11/2013  . Seizure disorder ([place]) 01/11/2013  . Gastric ulcer 01/11/2013  . Hypothyroidism 01/11/2013  . Obstructive sleep apnea 01/11/2013     Sumner Boast., PT 12/03/2019, 10:52 AM  [address] [place], [place], [address] Phone: [phone]   Fax:  [phone]  Name: Jennifer Pratt MRN: [id number] Date of Birth: 09/12/1961

## 2019-12-06 ENCOUNTER — Other Ambulatory Visit: Payer: Self-pay

## 2019-12-06 ENCOUNTER — Ambulatory Visit: Payer: BC Managed Care – PPO | Admitting: Physical Therapy

## 2019-12-06 DIAGNOSIS — R262 Difficulty in walking, not elsewhere classified: Secondary | ICD-10-CM

## 2019-12-06 DIAGNOSIS — M25551 Pain in right hip: Secondary | ICD-10-CM

## 2019-12-06 NOTE — Therapy (Signed)
[address] [place], [place], [address] Phone: [phone]   Fax:  [phone]  Physical Therapy Treatment  Patient Details  Name: Jennifer Pratt MRN: [id number] Date of Birth: Jun 30, 1961 Referring Provider (PT): Normajean Glasgow   Encounter Date: 12/06/2019  PT End of Session - 12/06/19 0943    Visit Number  7    Date for PT Re-Evaluation  01/07/20    PT Start Time  0845    PT Stop Time  0935    PT Time Calculation (min)  50 min       Past Medical History:  Diagnosis Date  . Arthritis   . Breast cancer ([place]) 06/28/2017   Grade 3, DCIS, 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  . Hypercholesterolemia   . Hypertension   . Hypothyroidism   . 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 Sleep apnea    USES CPAP  . Ulcer    Gastric    Past Surgical History:  Procedure Laterality Date  . BREAST BIOPSY Right 06/28/2017   Stereo affirm- UOQ/DUCTAL CARCINOMA IN SITU, HIGH NUCLEAR GRADE WITH COMEDONECROSIS   . BREAST EXCISIONAL BIOPSY Right 07/17/2017   WIde excision upper outer quadrant DCIS  Dr. Bary Castilla  . BREAST LUMPECTOMY Right 07/17/2017   DCIS mammosite  . CESAREAN SECTION    . COLONOSCOPY N/A 10/05/2016   Procedure: COLONOSCOPY;  Surgeon: Manya Silvas, MD;  Location: [facility] ENDOSCOPY;  Service: Endoscopy;  Laterality: N/A;  Zosyn IVPB  . JOINT REPLACEMENT    . MASTECTOMY, PARTIAL Right 07/17/2017   Procedure: MASTECTOMY PARTIAL;  Surgeon: Robert Bellow, MD;  Location: [facility] ORS;  Service: General;  Laterality: Right;  . thumb surgery Right 2005   AND CTR  . THYROIDECTOMY  1988  . TOTAL KNEE ARTHROPLASTY Left 2012    There were no vitals filed for this visit.  Subjective Assessment - 12/06/19 0936    Subjective  "really sore after last session. I just feel so  out of alignment"    Currently in Pain?  Yes    Pain Score  5     Pain Location  Hip    Pain Orientation  Right                        [facility] Adult PT Treatment/Exercise - 12/06/19 0001      Modalities   Modalities  Ultrasound      Ultrasound   Ultrasound Location  RT ITB    Ultrasound Parameters  3.3 mhz 100% cont    Ultrasound Goals  Pain      Manual Therapy   Manual Therapy  Passive ROM;Soft tissue mobilization;Muscle Energy Technique    Soft tissue mobilization  Rt lat ITB and quad    Passive ROM  BIL LE and trunk all motions of the hip joint, very painful with hip extension, stretching quad and hip flexor   pt did respond well to ppt with stretching   Other Manual Therapy  --    Muscle Energy Technique  RT ant rotation- adjusted and showed self mob             PT Education - 12/06/19 0942    Education Details  SI mob for RT ant rot SI, SI stab ex ppt, bridge with ball squueze, blue tband hip abd and flex with ppt    Person(s) Educated  Patient  Methods  Explanation;Demonstration;Verbal cues;Handout    Comprehension  Verbalized understanding;Returned demonstration       PT Short Term Goals - 11/13/19 1417      PT SHORT TERM GOAL #1   Title  independent with initial HEP    Status  Achieved        PT Long Term Goals - 12/06/19 0943      PT LONG TERM GOAL #1   Title  decrease pain 50%    Status  Partially Met      PT LONG TERM GOAL #2   Title  walk without limp    Status  On-going      PT LONG TERM GOAL #3   Title  understand posture and body mechanics    Status  Partially Met      PT LONG TERM GOAL #4   Title  return to exercise without pain    Status  On-going            Plan - 12/06/19 0943    Clinical Impression Statement  pt with significant RT ant rotated SI. some positive response to MET but unable to maintain alignment. tried heel lift in left with no SE. Passive stretching increase deep groin pain but with pt doing a ppt with PROM noted significnat increased ROM and less pain. Explained to pt that RT hip and ITB pain is coming from  poor gait d/t unstable SI an dshe VU- stressed need for SI stab ex and had pt preform and issued Handout. Pt stating using TENS at home 1-2 times per day. Added trial of [place] today to see if helpful.    PT Treatment/Interventions  ADLs/Self Care Home Management;Cryotherapy;Electrical Stimulation;Iontophoresis 44m/ml Dexamethasone;Moist Heat;Ultrasound;Gait training;Therapeutic activities;Therapeutic exercise;Balance training;Neuromuscular re-education;Manual techniques;Patient/family education;Dry needling    PT Next Visit Plan  decreased freq to 1 x week. pt also seeing chiropractor and massage therapist. pt apperaed to have good understanding of need ot work on SI stab that was issued as well as SI MET. Assess HEP next session       Patient will benefit from skilled therapeutic intervention in order to improve the following deficits and impairments:  Abnormal gait, Pain, Increased muscle spasms, Decreased mobility, Decreased activity tolerance, Decreased range of motion, Decreased strength, Impaired flexibility, Difficulty walking  Visit Diagnosis: Difficulty in walking, not elsewhere classified  Pain in right hip     Problem List Patient Active Problem List   Diagnosis Date Noted  . Vaginal burning 06/22/2019  . Anxiety 06/19/2018  . Ductal carcinoma in situ (DCIS) of right breast 07/08/2017  . Stress 04/09/2017  . Health care maintenance 06/14/2015  . Numbness of foot 06/09/2014  . Neck nodule 12/29/2013  . Generalized nonconvulsive epilepsy ([place] 09/11/2013  . [place] partial seizures ([place] 09/11/2013  . Encounter for therapeutic drug monitoring 09/11/2013  . Hypokalemia 06/24/2013  . Essential hypertension, benign 01/11/2013  . Hypercholesterolemia 01/11/2013  . Seizure disorder ([place] 01/11/2013  . Gastric ulcer 01/11/2013  . Hypothyroidism 01/11/2013  . Obstructive sleep apnea 01/11/2013    Hava Massingale,ANGIE PTA 12/06/2019, 9:49 AM  [address] [address]Phone: [phone]  Fax:  [phone] Name: Jennifer DIPERNAMRN: [id number] of Birth: 3December 30, 1962

## 2019-12-12 ENCOUNTER — Encounter: Payer: Self-pay | Admitting: Physical Therapy

## 2019-12-12 ENCOUNTER — Other Ambulatory Visit: Payer: Self-pay

## 2019-12-12 ENCOUNTER — Ambulatory Visit: Payer: BC Managed Care – PPO | Admitting: Physical Therapy

## 2019-12-12 DIAGNOSIS — M25551 Pain in right hip: Secondary | ICD-10-CM

## 2019-12-12 DIAGNOSIS — R262 Difficulty in walking, not elsewhere classified: Secondary | ICD-10-CM | POA: Diagnosis not present

## 2019-12-12 NOTE — Therapy (Signed)
[address] [place], [place], [address] Phone: [phone]   Fax:  [phone]  Physical Therapy Treatment  Patient Details  Name: Jennifer Pratt MRN: [id number] Date of Birth: 1961-07-15 Referring Provider (PT): Normajean Glasgow   Encounter Date: 12/12/2019  PT End of Session - 12/12/19 0839    Visit Number  8    Date for PT Re-Evaluation  01/07/20    PT Start Time  0750    PT Stop Time  0838    PT Time Calculation (min)  48 min    Activity Tolerance  Patient tolerated treatment well    Behavior During Therapy  [facility] for tasks assessed/performed       Past Medical History:  Diagnosis Date  . Arthritis   . Breast cancer ([place]) 06/28/2017   Grade 3, DCIS, 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  . Hypercholesterolemia   . Hypertension   . Hypothyroidism   . 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 Sleep apnea    USES CPAP  . Ulcer    Gastric    Past Surgical History:  Procedure Laterality Date  . BREAST BIOPSY Right 06/28/2017   Stereo affirm- UOQ/DUCTAL CARCINOMA IN SITU, HIGH NUCLEAR GRADE WITH COMEDONECROSIS   . BREAST EXCISIONAL BIOPSY Right 07/17/2017   WIde excision upper outer quadrant DCIS  Dr. Bary Castilla  . BREAST LUMPECTOMY Right 07/17/2017   DCIS mammosite  . CESAREAN SECTION    . COLONOSCOPY N/A 10/05/2016   Procedure: COLONOSCOPY;  Surgeon: Manya Silvas, MD;  Location: [facility] ENDOSCOPY;  Service: Endoscopy;  Laterality: N/A;  Zosyn IVPB  . JOINT REPLACEMENT    . MASTECTOMY, PARTIAL Right 07/17/2017   Procedure: MASTECTOMY PARTIAL;  Surgeon: Robert Bellow, MD;  Location: [facility] ORS;  Service: General;  Laterality: Right;  . thumb surgery Right 2005   AND CTR  . THYROIDECTOMY  1988  . TOTAL KNEE ARTHROPLASTY Left 2012    There were no vitals filed for this visit.  Subjective Assessment - 12/12/19 0754    Subjective  Patient reports that she is moving better,  specifically reports getting up and walking easier instead of struggling to get up and then having to weight shift and move before walking    Currently in Pain?  Yes    Pain Score  5     Pain Location  Hip    Pain Orientation  Right    Pain Descriptors / Indicators  Tightness;Sore    Aggravating Factors   walking, standing and as the day goes on    Pain Relieving Factors  I think the treatment is helping                       [facility] Adult PT Treatment/Exercise - 12/12/19 0001      Ultrasound   Ultrasound Location  right SI area    Ultrasound Parameters  1MHz 1.5 w/cm 2    Ultrasound Goals  Pain      Manual Therapy   Manual Therapy  Passive ROM;Soft tissue mobilization;Muscle Energy Technique    Soft tissue mobilization  Rt lat ITB and quad, into piriformis and SI area with vibration    Passive ROM  BIL LE and trunk all motions of the hip joint, very painful with hip extension, stretching quad and hip flexor    Other Manual Therapy  manual distal right hip distraction, then  some varied angle distraction of the leg and hip    Muscle Energy Technique  some SI MET and right lowerr extremity contract relax stretches               PT Short Term Goals - 11/13/19 1417      PT SHORT TERM GOAL #1   Title  independent with initial HEP    Status  Achieved        PT Long Term Goals - 12/12/19 3159      PT LONG TERM GOAL #1   Title  decrease pain 50%    Status  Partially Met      PT LONG TERM GOAL #2   Title  walk without limp    Status  On-going      PT LONG TERM GOAL #3   Title  understand posture and body mechanics    Status  Partially Met      PT LONG TERM GOAL #4   Status  On-going            Plan - 12/12/19 0840    Clinical Impression Statement  Patient reports that she is doing better with her movements, still pain with walking and standing and as the day goes on.  She is tight in the right hip mms, possibly due to pain and gaurding.  The  distraction seemed to alleviate some pain.    PT Next Visit Plan  asked her to continue the SI MET that she was doing, had some more stretching at home gently and some contract relax to activate the mms, spoke about Kegels to help with pelvic stability    Consulted and Agree with Plan of Care  Patient       Patient will benefit from skilled therapeutic intervention in order to improve the following deficits and impairments:  Abnormal gait, Pain, Increased muscle spasms, Decreased mobility, Decreased activity tolerance, Decreased range of motion, Decreased strength, Impaired flexibility, Difficulty walking  Visit Diagnosis: Difficulty in walking, not elsewhere classified  Pain in right hip     Problem List Patient Active Problem List   Diagnosis Date Noted  . Vaginal burning 06/22/2019  . Anxiety 06/19/2018  . Ductal carcinoma in situ (DCIS) of right breast 07/08/2017  . Stress 04/09/2017  . Health care maintenance 06/14/2015  . Numbness of foot 06/09/2014  . Neck nodule 12/29/2013  . 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 Simple partial seizures ([place]) 09/11/2013  . Encounter for therapeutic drug monitoring 09/11/2013  . Hypokalemia 06/24/2013  . Essential hypertension, benign 01/11/2013  . Hypercholesterolemia 01/11/2013  . Seizure disorder ([place]) 01/11/2013  . Gastric ulcer 01/11/2013  . Hypothyroidism 01/11/2013  . Obstructive sleep apnea 01/11/2013    Sumner Boast., PT 12/12/2019, 8:44 AM  [facility] [address] [place], [place], [address] Phone: [phone]   Fax:  [phone]  Name: Jennifer Pratt MRN: [id number] Date of Birth: 1961-05-08

## 2019-12-17 DIAGNOSIS — D0511 Intraductal carcinoma in situ of right breast: Secondary | ICD-10-CM | POA: Diagnosis not present

## 2019-12-19 ENCOUNTER — Other Ambulatory Visit: Payer: Self-pay

## 2019-12-19 ENCOUNTER — Ambulatory Visit: Payer: BC Managed Care – PPO | Admitting: Physical Therapy

## 2019-12-19 DIAGNOSIS — R262 Difficulty in walking, not elsewhere classified: Secondary | ICD-10-CM

## 2019-12-19 DIAGNOSIS — M25551 Pain in right hip: Secondary | ICD-10-CM | POA: Diagnosis not present

## 2019-12-19 NOTE — Therapy (Signed)
[address] [place], [place], [address] Phone: [phone]   Fax:  [phone]  Physical Therapy Treatment  Patient Details  Name: Jennifer Pratt MRN: [id number] Date of Birth: December 29, 1960 Referring Provider (PT): Normajean Glasgow   Encounter Date: 12/19/2019  PT End of Session - 12/19/19 1657    Visit Number  9    Date for PT Re-Evaluation  01/07/20    PT Start Time  1615    PT Stop Time  1645    PT Time Calculation (min)  30 min       Past Medical History:  Diagnosis Date  . Arthritis   . Breast cancer ([place]) 06/28/2017   Grade 3, DCIS, 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  . Hypercholesterolemia   . Hypertension   . Hypothyroidism   . 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 Sleep apnea    USES CPAP  . Ulcer    Gastric    Past Surgical History:  Procedure Laterality Date  . BREAST BIOPSY Right 06/28/2017   Stereo affirm- UOQ/DUCTAL CARCINOMA IN SITU, HIGH NUCLEAR GRADE WITH COMEDONECROSIS   . BREAST EXCISIONAL BIOPSY Right 07/17/2017   WIde excision upper outer quadrant DCIS  Dr. Bary Castilla  . BREAST LUMPECTOMY Right 07/17/2017   DCIS mammosite  . CESAREAN SECTION    . COLONOSCOPY N/A 10/05/2016   Procedure: COLONOSCOPY;  Surgeon: Manya Silvas, MD;  Location: [facility] ENDOSCOPY;  Service: Endoscopy;  Laterality: N/A;  Zosyn IVPB  . JOINT REPLACEMENT    . MASTECTOMY, PARTIAL Right 07/17/2017   Procedure: MASTECTOMY PARTIAL;  Surgeon: Robert Bellow, MD;  Location: [facility] ORS;  Service: General;  Laterality: Right;  . thumb surgery Right 2005   AND CTR  . THYROIDECTOMY  1988  . TOTAL KNEE ARTHROPLASTY Left 2012    There were no vitals filed for this visit.  Subjective Assessment - 12/19/19 1609    Subjective  pt arrived frustrated with pain and reports no lasting changes.pt has TENS at home, reports using it,foam roller and heat. pt states doing stab ex and stretches. Pt c/o  groin,back ,SI, hip and lateral RT leg pain    Currently in Pain?  Yes    Pain Score  6                        [facility] Adult PT Treatment/Exercise - 12/19/19 0001      Manual Therapy   Manual Therapy  Passive ROM;Soft tissue mobilization;Muscle Energy Technique    Soft tissue mobilization  Rt lat ITB and distal leg and quad, into piriformis and SI area     Passive ROM  BIL LE and trunk all motions of the hip joint, very painful with hip extension, stretching quad and hip flexor    Other Manual Therapy  manual distal right hip distraction, then some varied angle distraction of the leg and hip    Muscle Energy Technique  some SI MET and right lowerr extremity contract relax stretches               PT Short Term Goals - 11/13/19 1417      PT SHORT TERM GOAL #1   Title  independent with initial HEP    Status  Achieved        PT Long Term Goals - 12/12/19 0842      PT LONG TERM GOAL #  1   Title  decrease pain 50%    Status  Partially Met      PT LONG TERM GOAL #2   Title  walk without limp    Status  On-going      PT LONG TERM GOAL #3   Title  understand posture and body mechanics    Status  Partially Met      PT LONG TERM GOAL #4   Status  On-going            Plan - 12/19/19 1657    Clinical Impression Statement  pt has had 9 PT session with a variety of modalitles and MT with mininal to no changes. pt does have increased mobility in RT SI- when she goes into a ppt she has increased ROM and less pain but unable to maiantain this. Pt has constant groin pain and RT lateral leg pain with TP present. At this time she is very frustrated and wondering what other options are out there re: MD- rec finding an SI specialist. Also suggested referral to our [facility] to see Earlie Counts for her assessment.    PT Next Visit Plan  Refer to [facility] for her MT asssessment       Patient will benefit from skilled therapeutic intervention in  order to improve the following deficits and impairments:  Abnormal gait, Pain, Increased muscle spasms, Decreased mobility, Decreased activity tolerance, Decreased range of motion, Decreased strength, Impaired flexibility, Difficulty walking  Visit Diagnosis: Pain in right hip  Difficulty in walking, not elsewhere classified     Problem List Patient Active Problem List   Diagnosis Date Noted  . Vaginal burning 06/22/2019  . Anxiety 06/19/2018  . Ductal carcinoma in situ (DCIS) of right breast 07/08/2017  . Stress 04/09/2017  . Health care maintenance 06/14/2015  . Numbness of foot 06/09/2014  . Neck nodule 12/29/2013  . 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 Simple partial seizures ([place]) 09/11/2013  . Encounter for therapeutic drug monitoring 09/11/2013  . Hypokalemia 06/24/2013  . Essential hypertension, benign 01/11/2013  . Hypercholesterolemia 01/11/2013  . Seizure disorder ([place]) 01/11/2013  . Gastric ulcer 01/11/2013  . Hypothyroidism 01/11/2013  . Obstructive sleep apnea 01/11/2013    Florena Kozma,ANGIE PTA 12/19/2019, 5:25 PM  [address] [place], [place], [address] Phone: [phone]   Fax:  [phone]  Name: KACELYN ROWZEE MRN: [id number] Date of Birth: [birth year]

## 2019-12-23 ENCOUNTER — Encounter: Payer: Self-pay | Admitting: Internal Medicine

## 2019-12-23 MED ORDER — DULOXETINE HCL 60 MG PO CPEP: 60.0000 mg | ORAL_CAPSULE | Freq: Every day | ORAL | 1 refills | Status: DC

## 2019-12-23 MED ORDER — SERTRALINE HCL 50 MG PO TABS: 50.0000 mg | ORAL_TABLET | Freq: Every day | ORAL | 0 refills | Status: DC

## 2019-12-23 NOTE — Telephone Encounter (Signed)
rx sent in for cymbalta and zoloft

## 2019-12-23 NOTE — Telephone Encounter (Signed)
PCP ok with filling Cymbalta and Zoloft patient no longer seeing Nicolasa Ducking and out of Cymbalta.

## 2019-12-26 ENCOUNTER — Other Ambulatory Visit: Payer: Self-pay

## 2019-12-26 ENCOUNTER — Encounter: Payer: Self-pay | Admitting: Physical Therapy

## 2019-12-26 ENCOUNTER — Ambulatory Visit: Payer: BC Managed Care – PPO | Attending: Physical Medicine and Rehabilitation | Admitting: Physical Therapy

## 2019-12-26 ENCOUNTER — Ambulatory Visit: Payer: BC Managed Care – PPO | Admitting: Internal Medicine

## 2019-12-26 DIAGNOSIS — R262 Difficulty in walking, not elsewhere classified: Secondary | ICD-10-CM | POA: Diagnosis not present

## 2019-12-26 DIAGNOSIS — M25551 Pain in right hip: Secondary | ICD-10-CM | POA: Insufficient documentation

## 2019-12-26 NOTE — Therapy (Signed)
[facility] [address], [place], [place], [address] Phone: [phone]   Fax:  [phone]  Physical Therapy Treatment  Patient Details  Name: Jennifer Pratt MRN: [id number] Date of Birth: Jun 19, 1961 Referring Provider (PT): Normajean Glasgow   Encounter Date: 12/26/2019  PT End of Session - 12/26/19 1157    Visit Number  10    Date for PT Re-Evaluation  01/07/20    PT Start Time  0845    PT Stop Time  0930    PT Time Calculation (min)  45 min    Activity Tolerance  Patient tolerated treatment well;No increased pain    Behavior During Therapy  WFL for tasks assessed/performed       Past Medical History:  Diagnosis Date  . Arthritis   . Breast cancer ([place]) 06/28/2017   Grade 3, DCIS, 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  . Hypercholesterolemia   . Hypertension   . Hypothyroidism   . 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 Sleep apnea    USES CPAP  . Ulcer    Gastric    Past Surgical History:  Procedure Laterality Date  . BREAST BIOPSY Right 06/28/2017   Stereo affirm- UOQ/DUCTAL CARCINOMA IN SITU, HIGH NUCLEAR GRADE WITH COMEDONECROSIS   . BREAST EXCISIONAL BIOPSY Right 07/17/2017   WIde excision upper outer quadrant DCIS  Dr. Bary Castilla  . BREAST LUMPECTOMY Right 07/17/2017   DCIS mammosite  . CESAREAN SECTION    . COLONOSCOPY N/A 10/05/2016   Procedure: COLONOSCOPY;  Surgeon: Manya Silvas, MD;  Location: [facility] ENDOSCOPY;  Service: Endoscopy;  Laterality: N/A;  Zosyn IVPB  . JOINT REPLACEMENT    . MASTECTOMY, PARTIAL Right 07/17/2017   Procedure: MASTECTOMY PARTIAL;  Surgeon: Robert Bellow, MD;  Location: [facility] ORS;  Service: General;  Laterality: Right;  . thumb surgery Right 2005   AND CTR  . THYROIDECTOMY  1988  . TOTAL KNEE ARTHROPLASTY Left 2012    There were no vitals filed for this visit.  Subjective Assessment - 12/26/19 0841    Subjective  I got a SI belt and a wrap  for my lower right leg. I have had SI issue for 30 years when I had my son. No urinary leakage. No pain with intercourse.    Pertinent History  left TKA    Limitations  Lifting;Standing;Walking    How long can you stand comfortably?  difficulty > 10 minutes    How long can you walk comfortably?  had to use a cane earlier in the summer    Patient Stated Goals  have less pain, walk better    Currently in Pain?  Yes    Pain Location  Hip    Pain Orientation  Right    Pain Descriptors / Indicators  Dull;Stabbing    Pain Type  Acute pain    Pain Onset  More than a month ago    Pain Frequency  Constant    Aggravating Factors   walking, standing and as the day goes on    Pain Relieving Factors  braces    Multiple Pain Sites  No         [facility] PT Assessment - 12/26/19 0001      Assessment   Medical Diagnosis  right hip and left hip pain    Referring Provider (PT)  Normajean Glasgow    Onset Date/Surgical Date  10/07/19    Prior Therapy  no      Precautions   Precautions  None      Home Environment   Additional Comments  has stairs, was doing yardwork      Prior Function   Level of Independence  Independent    Vocation  Full time employment    Vocation Requirements  accounting    Leisure  rides motorcycles      ROM / Strength   AROM / PROM / Strength  AROM;PROM;Strength      PROM   Right Hip Extension  -8    Right Hip Flexion  95    Right Hip External Rotation   50    Right Hip Internal Rotation   5    Right Hip ABduction  12      Palpation   Spinal mobility  L1-L5 tight and rotated right    SI assessment   right ilium is higher, right ilium is rotated anteriorly      Ambulation/Gait   Gait Comments  walk with a limp on the right, right hip abducted, decreased right hip extension                   [facility] Adult PT Treatment/Exercise - 12/26/19 0001      Knee/Hip Exercises: Stretches   Hip Flexor Stretch  Right;Left;1 rep;30 seconds    Hip Flexor Stretch  Limitations  standing    ITB Stretch  Right;2 reps;30 seconds    ITB Stretch Limitations  in standng    Piriformis Stretch  Right;Left;1 rep;30 seconds    Piriformis Stretch Limitations  sitting      Knee/Hip Exercises: Prone   Hip Extension  Strengthening;Right    Hip Extension Limitations  with knee flexed      Manual Therapy   Manual Therapy  Soft tissue mobilization;Joint mobilization    Joint Mobilization  using the mulligan belt to distract the right hip, inferior glide, lateral glide; manually anterior glide of the right hip grade 3; gapping of the right side of L1-L5 in sidely    Soft tissue mobilization  soft tissue work using the addaday to the right hip adductors and quads; manual soft tissue work to the left hip adductor, left ilioposas, left quadratus, left lumbar paraspinals       Trigger Point Dry Needling - 12/26/19 0001    Consent Given?  Yes    Education Handout Provided  Previously provided    Muscles Treated Lower Quadrant  Adductor longus/brevis/magnus    Muscles Treated Back/Hip  Lumbar multifidi;Iliopsoas;Quadratus lumborum    Adductor Response  Twitch response elicited;Palpable increased muscle length    Lumbar multifidi Response  Twitch response elicited;Palpable increased muscle length    Iliopsoas Response  Twitch response elicited;Palpable increased muscle length    Quadratus Lumborum Response  Twitch response elicited;Palpable increased muscle length           PT Education - 12/26/19 1157    Education Details  education on stretches she can do for hip    Person(s) Educated  Patient    Methods  Explanation;Demonstration    Comprehension  Verbalized understanding;Returned demonstration       PT Short Term Goals - 11/13/19 1417      PT SHORT TERM GOAL #1   Title  independent with initial HEP    Status  Achieved        PT Long Term Goals - 12/12/19 0842      PT LONG TERM GOAL #1  Title  decrease pain 50%    Status  Partially Met       PT LONG TERM GOAL #2   Title  walk without limp    Status  On-going      PT LONG TERM GOAL #3   Title  understand posture and body mechanics    Status  Partially Met      PT LONG TERM GOAL #4   Status  On-going            Plan - 12/26/19 1159    Clinical Impression Statement  After manual work patient was able to walk with a decreased limp on the right, Patient was able to cross her right leg onto the left leg for piriromis stretch. Patient was able to lay prone with her  right hip on the mat. Patient has limited right hip P/ROM, decreased rigth hip strength, right ilium is higher than the left, decreased mobiltiy of the lumbar spine. Patient had trigger points in the right hip adductors, quads, quadratus, lumbar multifidi and right iliopsoas. Patient will benefit from skilled therapy to elongate the muscles in the back and hip, increased strength and improve right hip ROM.    Stability/Clinical Decision Making  Stable/Uncomplicated    Rehab Potential  Good    PT Frequency  2x / week    PT Duration  8 weeks    PT Treatment/Interventions  ADLs/Self Care Home Management;Cryotherapy;Electrical Stimulation;Iontophoresis 35m/ml Dexamethasone;Moist Heat;Ultrasound;Gait training;Therapeutic activities;Therapeutic exercise;Balance training;Neuromuscular re-education;Manual techniques;Patient/family education;Dry needling    PT Next Visit Plan  give stretches for home; work on right hip extension, see if release the right quadratus to bring the right ilium down, work on pressing the feet into the floor to improve posture, walking on treadmill or elliptical to improve hip extension,    Recommended Other Services  MD signed intial note    Consulted and Agree with Plan of Care  Patient       Patient will benefit from skilled therapeutic intervention in order to improve the following deficits and impairments:  Abnormal gait, Pain, Increased muscle spasms, Decreased mobility, Decreased activity  tolerance, Decreased range of motion, Decreased strength, Impaired flexibility, Difficulty walking  Visit Diagnosis: Pain in right hip  Difficulty in walking, not elsewhere classified     Problem List Patient Active Problem List   Diagnosis Date Noted  . Vaginal burning 06/22/2019  . Anxiety 06/19/2018  . Ductal carcinoma in situ (DCIS) of right breast 07/08/2017  . Stress 04/09/2017  . Health care maintenance 06/14/2015  . Numbness of foot 06/09/2014  . Neck nodule 12/29/2013  . Generalized nonconvulsive epilepsy ([place] 09/11/2013  . Simple partial seizures ([place] 09/11/2013  . Encounter for therapeutic drug monitoring 09/11/2013  . Hypokalemia 06/24/2013  . Essential hypertension, benign 01/11/2013  . Hypercholesterolemia 01/11/2013  . Seizure disorder ([place] 01/11/2013  . Gastric ulcer 01/11/2013  . Hypothyroidism 01/11/2013  . Obstructive sleep apnea 01/11/2013    CEarlie Counts PT 12/26/19 12:04 PM   [facility] [address] [place] [address]Phone: [phone]  Fax:  [phone] Name: Jennifer CANEDOMRN: [id number] of Birth: 304/26/1962

## 2019-12-27 ENCOUNTER — Encounter: Payer: Self-pay | Admitting: Physical Therapy

## 2020-01-01 ENCOUNTER — Other Ambulatory Visit: Payer: Self-pay

## 2020-01-01 ENCOUNTER — Ambulatory Visit: Payer: BC Managed Care – PPO | Attending: Physical Medicine and Rehabilitation | Admitting: Physical Therapy

## 2020-01-01 ENCOUNTER — Encounter: Payer: Self-pay | Admitting: Physical Therapy

## 2020-01-01 DIAGNOSIS — M25552 Pain in left hip: Secondary | ICD-10-CM | POA: Diagnosis not present

## 2020-01-01 DIAGNOSIS — M25551 Pain in right hip: Secondary | ICD-10-CM | POA: Diagnosis not present

## 2020-01-01 DIAGNOSIS — M545 Low back pain: Secondary | ICD-10-CM | POA: Insufficient documentation

## 2020-01-01 DIAGNOSIS — R262 Difficulty in walking, not elsewhere classified: Secondary | ICD-10-CM | POA: Diagnosis not present

## 2020-01-01 NOTE — Therapy (Signed)
[facility] [address], [place], [place], [address] Phone: [phone]   Fax:  [phone]  Physical Therapy Treatment  Patient Details  Name: Jennifer Pratt MRN: [id number] Date of Birth: 1961/07/08 Referring Provider (PT): Normajean Glasgow   Encounter Date: 01/01/2020  PT End of Session - 01/01/20 0845    Visit Number  11    Date for PT Re-Evaluation  01/07/20    Authorization Type  [employer]    PT Start Time  0845    PT Stop Time  0925    PT Time Calculation (min)  40 min    Activity Tolerance  Patient tolerated treatment well;No increased pain    Behavior During Therapy  WFL for tasks assessed/performed       Past Medical History:  Diagnosis Date  . Arthritis   . Breast cancer ([place]) 06/28/2017   Grade 3, DCIS, 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  . Hypercholesterolemia   . Hypertension   . Hypothyroidism   . 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 Sleep apnea    USES CPAP  . Ulcer    Gastric    Past Surgical History:  Procedure Laterality Date  . BREAST BIOPSY Right 06/28/2017   Stereo affirm- UOQ/DUCTAL CARCINOMA IN SITU, HIGH NUCLEAR GRADE WITH COMEDONECROSIS   . BREAST EXCISIONAL BIOPSY Right 07/17/2017   WIde excision upper outer quadrant DCIS  Dr. Bary Castilla  . BREAST LUMPECTOMY Right 07/17/2017   DCIS mammosite  . CESAREAN SECTION    . COLONOSCOPY N/A 10/05/2016   Procedure: COLONOSCOPY;  Surgeon: Manya Silvas, MD;  Location[facility] ENDOSCOPY;  Service: Endoscopy;  Laterality: N/A;  Zosyn IVPB  . JOINT REPLACEMENT    . MASTECTOMY, PARTIAL Right 07/17/2017   Procedure: MASTECTOMY PARTIAL;  Surgeon: Robert Bellow, MD;  Location: [facility] ORS;  Service: General;  Laterality: Right;  . thumb surgery Right 2005   AND CTR  . THYROIDECTOMY  1988  . TOTAL KNEE ARTHROPLASTY Left 2012    There were no vitals filed for this visit.  Subjective Assessment - 01/01/20 0846    Subjective   My left SI joint is bothering me. The right groin pain is gone. I have been wearing my SI belt.    Pertinent History  left TKA    Limitations  Lifting;Standing;Walking    How long can you stand comfortably?  difficulty > 10 minutes    How long can you walk comfortably?  had to use a cane earlier in the summer    Patient Stated Goals  have less pain, walk better    Currently in Pain?  Yes    Pain Score  3     Pain Orientation  Right    Pain Descriptors / Indicators  Dull    Pain Type  Acute pain    Pain Onset  More than a month ago    Pain Frequency  Constant    Aggravating Factors   walking, standing    Pain Relieving Factors  braces    Multiple Pain Sites  Yes    Pain Score  6    Pain Location  Sacrum    Pain Orientation  Left    Pain Descriptors / Indicators  Stabbing;Dull    Pain Type  Acute pain    Pain Onset  More than a month ago    Pain Frequency  Intermittent    Aggravating Factors   walking,  standing in one place    Pain Relieving Factors  sit                       [facility] Adult PT Treatment/Exercise - 01/01/20 0001      Knee/Hip Exercises: Aerobic   Tread Mill  0.8 mph for 5 min working on upright posture and reducing gait defisits      Knee/Hip Exercises: Supine   Bridges  Strengthening;1 set;15 reps    Bridges Limitations  keeping pelvis equal      Knee/Hip Exercises: Prone   Hip Extension  Strengthening;Right;Left;1 set;10 reps    Hip Extension Limitations  VC to abdominal brace and contract gluteals    Other Prone Exercises  prone hip IR/ER to promote movement      Manual Therapy   Manual Therapy  Soft tissue mobilization;Joint mobilization    Joint Mobilization  sideglide to L1-L5 in prone, Sacral rotational mobilization     Soft tissue mobilization  using the addaday to massage the lumbar paraspinals, left gluteals, left hamstring and left lateral thigh    Passive ROM  bilateral hip rotators       Trigger Point Dry Needling - 01/01/20  0001    Consent Given?  Yes    Education Handout Provided  Previously provided    Muscles Treated Back/Hip  Lumbar multifidi;Quadratus lumborum;Gluteus medius    Gluteus Medius Response  Palpable increased muscle length;Twitch response elicited    Lumbar multifidi Response  Twitch response elicited;Palpable increased muscle length    Quadratus Lumborum Response  Twitch response elicited;Palpable increased muscle length           PT Education - 01/01/20 0925    Education Details  Access Code: [email or web address]    Person(s) Educated  Patient    Methods  Explanation;Demonstration;Verbal cues;Handout    Comprehension  Verbalized understanding;Returned demonstration       PT Short Term Goals - 11/13/19 1417      PT SHORT TERM GOAL #1   Title  independent with initial HEP    Status  Achieved        PT Long Term Goals - 12/12/19 0842      PT LONG TERM GOAL #1   Title  decrease pain 50%    Status  Partially Met      PT LONG TERM GOAL #2   Title  walk without limp    Status  On-going      PT LONG TERM GOAL #3   Title  understand posture and body mechanics    Status  Partially Met      PT LONG TERM GOAL #4   Status  On-going            Plan - 01/01/20 0914    Clinical Impression Statement  Patient walks with her left hip hiked. Patient continues to have trigger points in the lumbar paraspinals. Patient walks with wide base of support. Patient is able to extend her hips in prone but has pain. Patient is working on correct walking instead of compensation. After therapy pain level decreased to 4/10. Patient will benefit from skilled therapy to elongate the muscels in back and hip, increased strength and improve right hip ROM.    Stability/Clinical Decision Making  Stable/Uncomplicated    Rehab Potential  Good    PT Frequency  2x / week    PT Duration  8 weeks    PT Treatment/Interventions  ADLs/Self Care Home Management;Cryotherapy;Electrical Stimulation;Iontophoresis  47m/ml  Dexamethasone;Moist Heat;Ultrasound;Gait training;Therapeutic activities;Therapeutic exercise;Balance training;Neuromuscular re-education;Manual techniques;Patient/family education;Dry needling    PT Next Visit Plan  quadruped work on post glide with band, dry needle iliopsoas, mobilize right hip, work on rotation and hip adduction on right    PT Home Exercise Plan  Access Code: [email or web address]    Consulted and Agree with Plan of Care  Patient       Patient will benefit from skilled therapeutic intervention in order to improve the following deficits and impairments:  Abnormal gait, Pain, Increased muscle spasms, Decreased mobility, Decreased activity tolerance, Decreased range of motion, Decreased strength, Impaired flexibility, Difficulty walking  Visit Diagnosis: Pain in right hip  Difficulty in walking, not elsewhere classified     Problem List Patient Active Problem List   Diagnosis Date Noted  . Vaginal burning 06/22/2019  . Anxiety 06/19/2018  . Ductal carcinoma in situ (DCIS) of right breast 07/08/2017  . Stress 04/09/2017  . Health care maintenance 06/14/2015  . Numbness of foot 06/09/2014  . Neck nodule 12/29/2013  . Generalized nonconvulsive epilepsy ([place] 09/11/2013  . Simple partial seizures ([place] 09/11/2013  . Encounter for therapeutic drug monitoring 09/11/2013  . Hypokalemia 06/24/2013  . Essential hypertension, benign 01/11/2013  . Hypercholesterolemia 01/11/2013  . Seizure disorder ([place] 01/11/2013  . Gastric ulcer 01/11/2013  . Hypothyroidism 01/11/2013  . Obstructive sleep apnea 01/11/2013    CEarlie Counts PT 01/01/20 9:32 AM   [facility] [address] [place] [address]Phone: [phone]  Fax:  [phone] Name: AMARVELINE PROFETAMRN: [id number] of Birth: 304/07/1961

## 2020-01-01 NOTE — Patient Instructions (Signed)
Access Code: [email or web address]  URL: [email or web address]  Date: 01/01/2020  Prepared by: Earlie Counts   Exercises Beginner Bridge - 15 reps - 1 sets - 1x daily - 7x weekly Prone Hip Extension - Two Pillows - 10 reps - 1 sets - 1x daily - 7x weekly Prone Hip Abduction on Slider - 20 reps - 1 sets - 1x daily - 7x weekly [facility] [address], [place], [place] [address] Phone # [phone] Fax [phone]

## 2020-01-03 ENCOUNTER — Encounter: Payer: Self-pay | Admitting: Physical Therapy

## 2020-01-03 ENCOUNTER — Other Ambulatory Visit: Payer: Self-pay

## 2020-01-03 ENCOUNTER — Ambulatory Visit: Payer: BC Managed Care – PPO | Admitting: Physical Therapy

## 2020-01-03 DIAGNOSIS — M25551 Pain in right hip: Secondary | ICD-10-CM

## 2020-01-03 DIAGNOSIS — M545 Low back pain: Secondary | ICD-10-CM | POA: Diagnosis not present

## 2020-01-03 DIAGNOSIS — M25552 Pain in left hip: Secondary | ICD-10-CM | POA: Diagnosis not present

## 2020-01-03 DIAGNOSIS — R262 Difficulty in walking, not elsewhere classified: Secondary | ICD-10-CM | POA: Diagnosis not present

## 2020-01-03 NOTE — Patient Instructions (Signed)
Access Code: [email or web address]  URL: [email or web address]  Date: 01/03/2020  Prepared by: Earlie Counts   Exercises Beginner Bridge - 15 reps - 1 sets - 1x daily - 7x weekly Prone Hip Extension - Two Pillows - 10 reps - 1 sets - 1x daily - 7x weekly Prone Hip Abduction on Slider - 20 reps - 1 sets - 1x daily - 7x weekly Hip Flexor Mobilization with Foam Roll - 10 reps - 1 sets - 1x daily                            - 7x weekly Sidelying IT Band Foam Roll Mobilization - 10 reps - 1 sets - 1x daily - 7x weekly Piriformis Mobilization on Foam Roll - 10 reps - 1 sets - 1x daily - 7x weekly  [facility] [address], [place], [place] [address] Phone # [phone] Fax [phone]

## 2020-01-03 NOTE — Therapy (Signed)
[facility] [address] [place], [place], [address] Phone: [phone]   Fax:  [phone]  Physical Therapy Treatment  Patient Details  Name: Jennifer Pratt MRN: [id number] Date of Birth: Oct 01, 1961 Referring Provider (PT): Normajean Glasgow   Encounter Date: 01/03/2020  PT End of Session - 01/03/20 0923    Visit Number  12    Date for PT Re-Evaluation  01/07/20    Authorization Type  BCBS    PT Start Time  0845    PT Stop Time  0923    PT Time Calculation (min)  38 min    Activity Tolerance  Patient tolerated treatment well;No increased pain    Behavior During Therapy  WFL for tasks assessed/performed       Past Medical History:  Diagnosis Date  . Arthritis   . 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  . Hypercholesterolemia   . Hypertension   . Hypothyroidism   . 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 Sleep apnea    USES CPAP  . Ulcer    Gastric    Past Surgical History:  Procedure Laterality Date  . BREAST BIOPSY Right 06/28/2017   Stereo affirm- UOQ/DUCTAL CARCINOMA IN SITU, HIGH NUCLEAR GRADE WITH COMEDONECROSIS   . BREAST EXCISIONAL BIOPSY Right 07/17/2017   WIde excision upper outer quadrant DCIS  Dr. Bary Castilla  . BREAST LUMPECTOMY Right 07/17/2017   DCIS mammosite  . CESAREAN SECTION    . COLONOSCOPY N/A 10/05/2016   Procedure: COLONOSCOPY;  Surgeon: Manya Silvas, MD;  Location: [facility] ENDOSCOPY;  Service: Endoscopy;  Laterality: N/A;  Zosyn IVPB  . JOINT REPLACEMENT    . MASTECTOMY, PARTIAL Right 07/17/2017   Procedure: MASTECTOMY PARTIAL;  Surgeon: Robert Bellow, MD;  Location: [facility] ORS;  Service: General;  Laterality: Right;  . thumb surgery Right 2005   AND CTR  . THYROIDECTOMY  1988  . TOTAL KNEE ARTHROPLASTY Left 2012    There were no vitals filed for this visit.  Subjective Assessment - 01/03/20 0845    Subjective   I am walking better. I still have a pain in the right hip and left hip.    Pertinent History  left TKA    Limitations  Lifting;Standing;Walking    How long can you stand comfortably?  difficulty > 10 minutes    How long can you walk comfortably?  had to use a cane earlier in the summer    Patient Stated Goals  have less pain, walk better    Currently in Pain?  Yes    Pain Score  4     Pain Location  Hip    Pain Orientation  Right    Pain Descriptors / Indicators  Dull    Pain Type  Acute pain    Pain Radiating Towards  down the right thigh    Pain Onset  More than a month ago    Pain Frequency  Constant    Aggravating Factors   walking, standing    Pain Relieving Factors  braces    Multiple Pain Sites  Yes    Pain Score  0                       [facility] Adult PT Treatment/Exercise - 01/03/20 0001      Knee/Hip Exercises: Stretches   Hip Flexor Stretch  Both;1 rep;60 seconds    Hip Flexor Stretch Limitations  foam roller    ITB Stretch  Right;Left;60 seconds    ITB Stretch Limitations  foam roll    Piriformis Stretch  Right;Left;60 seconds    Piriformis Stretch Limitations  foam roll    Other Knee/Hip Stretches  place ball under left iliopsoas and move side to side to mobilize tissue      Knee/Hip Exercises: Aerobic   Recumbent Bike  level 1 for 5 min. while assessing patient      Knee/Hip Exercises: Supine   Bridges  Strengthening;1 set;15 reps    Bridges Limitations  keeping pelvis equal    Bridges with Clamshell  Strengthening;Both;1 set;15 reps      Knee/Hip Exercises: Prone   Hip Extension  AAROM;Strengthening;Left;1 set;10 reps    Other Prone Exercises  prone hip abduction with assistance at end range and mobilization of lumbar spine    Other Prone Exercises  prone pressup with overpressure 15 times      Manual Therapy   Manual Therapy  Joint mobilization;Soft tissue mobilization    Joint Mobilization  anterior glide of left hip in prone    Soft  tissue mobilization  left iliopsoas deep soft tissue work             PT Education - 01/03/20 0922    Education Details  Access Code: [email or web address]    Person(s) Educated  Patient    Methods  Explanation;Demonstration;Verbal cues;Handout    Comprehension  Verbalized understanding;Returned demonstration       PT Short Term Goals - 11/13/19 1417      PT SHORT TERM GOAL #1   Title  independent with initial HEP    Status  Achieved        PT Long Term Goals - 12/12/19 4765      PT LONG TERM GOAL #1   Title  decrease pain 50%    Status  Partially Met      PT LONG TERM GOAL #2   Title  walk without limp    Status  On-going      PT LONG TERM GOAL #3   Title  understand posture and body mechanics    Status  Partially Met      PT LONG TERM GOAL #4   Status  On-going            Plan - 01/03/20 0856    Clinical Impression Statement  Patient is walking with less of a limp, improved heel toe gait, and increased hip extension. Patient is able to extend her hip better in prone and abduct her hip. Patient has tightness in lumbar spine that is affecting her hip motion. Patient will benefit from skilled therapy to elongate the muscles in back and hip, increased strength, and improve right hip ROM.    Stability/Clinical Decision Making  Stable/Uncomplicated    Rehab Potential  Good    PT Frequency  2x / week    PT Duration  8 weeks    PT Treatment/Interventions  ADLs/Self Care Home Management;Cryotherapy;Electrical Stimulation;Iontophoresis 60m/ml Dexamethasone;Moist Heat;Ultrasound;Gait training;Therapeutic activities;Therapeutic exercise;Balance training;Neuromuscular re-education;Manual techniques;Patient/family education;Dry needling    PT Next Visit Plan  dry needling to lumbar, and quads; hip mobilization, measure ROM; braiding in standing    PT Home Exercise Plan  Access Code: [email or web address]    Consulted and Agree with Plan of Care  Patient       Patient will benefit from  skilled therapeutic intervention in order  to improve the following deficits and impairments:  Abnormal gait, Pain, Increased muscle spasms, Decreased mobility, Decreased activity tolerance, Decreased range of motion, Decreased strength, Impaired flexibility, Difficulty walking  Visit Diagnosis: Pain in right hip  Difficulty in walking, not elsewhere classified     Problem List Patient Active Problem List   Diagnosis Date Noted  . Vaginal burning 06/22/2019  . Anxiety 06/19/2018  . Ductal carcinoma in situ (DCIS) of right breast 07/08/2017  . Stress 04/09/2017  . Health care maintenance 06/14/2015  . Numbness of foot 06/09/2014  . Neck nodule 12/29/2013  . 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 Simple partial seizures ([place]) 09/11/2013  . Encounter for therapeutic drug monitoring 09/11/2013  . Hypokalemia 06/24/2013  . Essential hypertension, benign 01/11/2013  . Hypercholesterolemia 01/11/2013  . Seizure disorder ([place]) 01/11/2013  . Gastric ulcer 01/11/2013  . Hypothyroidism 01/11/2013  . Obstructive sleep apnea 01/11/2013    Earlie Counts, PT 01/03/20 9:27 AM   [facility] [address] [place], [place], [address] Phone: [phone]   Fax:  [phone]  Name: PERSEPHONIE HEGWOOD MRN: [id number] Date of Birth: [birth year]

## 2020-01-07 ENCOUNTER — Other Ambulatory Visit: Payer: Self-pay

## 2020-01-07 ENCOUNTER — Ambulatory Visit: Payer: BC Managed Care – PPO | Admitting: Physical Therapy

## 2020-01-07 ENCOUNTER — Encounter: Payer: Self-pay | Admitting: Physical Therapy

## 2020-01-07 DIAGNOSIS — M25552 Pain in left hip: Secondary | ICD-10-CM

## 2020-01-07 DIAGNOSIS — M545 Low back pain, unspecified: Secondary | ICD-10-CM

## 2020-01-07 DIAGNOSIS — M25551 Pain in right hip: Secondary | ICD-10-CM

## 2020-01-07 DIAGNOSIS — R262 Difficulty in walking, not elsewhere classified: Secondary | ICD-10-CM | POA: Diagnosis not present

## 2020-01-07 NOTE — Therapy (Signed)
[facility] [address], [place], [place], [address] Phone: [phone]   Fax:  [phone]  Physical Therapy Treatment  Patient Details  Name: Jennifer Pratt MRN: [id number] Date of Birth: July 01, 1961 Referring Provider (PT): Normajean Glasgow   Encounter Date: 01/07/2020  PT End of Session - 01/07/20 0928    Visit Number  13    Date for PT Re-Evaluation  03/03/20    Authorization Type  BCBS    PT Start Time  0845    PT Stop Time  0926    PT Time Calculation (min)  41 min    Activity Tolerance  Patient tolerated treatment well;No increased pain    Behavior During Therapy  WFL for tasks assessed/performed       Past Medical History:  Diagnosis Date  . Arthritis   . 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  . Hypercholesterolemia   . Hypertension   . Hypothyroidism   . 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 Sleep apnea    USES CPAP  . Ulcer    Gastric    Past Surgical History:  Procedure Laterality Date  . BREAST BIOPSY Right 06/28/2017   Stereo affirm- UOQ/DUCTAL CARCINOMA IN SITU, HIGH NUCLEAR GRADE WITH COMEDONECROSIS   . BREAST EXCISIONAL BIOPSY Right 07/17/2017   WIde excision upper outer quadrant DCIS  Dr. Bary Castilla  . BREAST LUMPECTOMY Right 07/17/2017   DCIS mammosite  . CESAREAN SECTION    . COLONOSCOPY N/A 10/05/2016   Procedure: COLONOSCOPY;  Surgeon: Manya Silvas, MD;  Location: [facility] ENDOSCOPY;  Service: Endoscopy;  Laterality: N/A;  Zosyn IVPB  . JOINT REPLACEMENT    . MASTECTOMY, PARTIAL Right 07/17/2017   Procedure: MASTECTOMY PARTIAL;  Surgeon: Robert Bellow, MD;  Location: [facility] ORS;  Service: General;  Laterality: Right;  . thumb surgery Right 2005   AND CTR  . THYROIDECTOMY  1988  . TOTAL KNEE ARTHROPLASTY Left 2012    There were no vitals filed for this visit.  Subjective Assessment - 01/07/20 0848    Subjective   I feel like some could be piriformis. I have some numbness on the left lateral thigh.    Pertinent History  left TKA    Limitations  Lifting;Standing;Walking    How long can you stand comfortably?  difficulty > 10 minutes    How long can you walk comfortably?  had to use a cane earlier in the summer    Patient Stated Goals  have less pain, walk better    Currently in Pain?  Yes    Pain Location  Hip    Pain Orientation  Right    Pain Descriptors / Indicators  Dull    Pain Type  Acute pain    Pain Onset  More than a month ago    Pain Frequency  Intermittent    Aggravating Factors   walking, standing    Pain Relieving Factors  braces    Pain Score  5    Pain Location  Sacrum    Pain Orientation  Left    Pain Descriptors / Indicators  Stabbing;Dull    Pain Type  Acute pain    Pain Onset  More than a month ago    Pain Frequency  Intermittent    Aggravating Factors   walking, standing in one place    Pain Relieving Factors  sit  [facility] PT Assessment - 01/07/20 0001      Assessment   Medical Diagnosis  right hip and left hip pain    Referring Provider (PT)  Normajean Glasgow    Onset Date/Surgical Date  10/07/19    Prior Therapy  no      Precautions   Precautions  None      Restrictions   Weight Bearing Restrictions  No      Home Environment   Additional Comments  has stairs, was doing yardwork      Prior Function   Level of Independence  Independent    Vocation  Full time employment    Vocation Requirements  accounting    Leisure  rides motorcycles      Cognition   Overall Cognitive Status  Within Functional Limits for tasks assessed      Posture/Postural Control   Posture/Postural Control  Postural limitations    Posture Comments  hips flexed      PROM   Right Hip Extension  5    Right Hip Flexion  110    Right Hip External Rotation   55    Right Hip Internal Rotation   15    Right Hip ABduction  20      Ambulation/Gait   Gait Comments  walk with a limp on  the right, right hip abducted, decreased right hip extension                   [facility] Adult PT Treatment/Exercise - 01/07/20 0001      Knee/Hip Exercises: Stretches   Hip Flexor Stretch  Right;Left;2 reps;30 seconds    Hip Flexor Stretch Limitations  with strap around hip to mobilize hip and foot on step    Gastroc Stretch  Both;1 rep;60 seconds    Gastroc Stretch Limitations  on slant board    Other Knee/Hip Stretches  hip stretches in standing to stretch hip adductors    Other Knee/Hip Stretches  hip flexion ROM with foot on step and strap around hip to mobilize bilaterally      Knee/Hip Exercises: Aerobic   Recumbent Bike  level 1 for 8 min. while assessing patient and updating goals      Manual Therapy   Manual Therapy  Soft tissue mobilization    Soft tissue mobilization  using the addaday to the lumbar multifidi, gluteus to elongate tissue after dry needling       Trigger Point Dry Needling - 01/07/20 0001    Consent Given?  Yes    Education Handout Provided  Previously provided    Muscles Treated Back/Hip  Lumbar multifidi;Gluteus minimus;Gluteus medius;Gluteus maximus    Gluteus Minimus Response  Twitch response elicited;Palpable increased muscle length    Gluteus Medius Response  Palpable increased muscle length;Twitch response elicited    Gluteus Maximus Response  Twitch response elicited;Palpable increased muscle length    Lumbar multifidi Response  Twitch response elicited;Palpable increased muscle length             PT Short Term Goals - 11/13/19 1417      PT SHORT TERM GOAL #1   Title  independent with initial HEP    Status  Achieved        PT Long Term Goals - 01/07/20 [id number]      PT LONG TERM GOAL #1   Title  decrease pain >/= 60% in bilateral hips and sacrum due to decreased gait deficits    Baseline  decreased by  35%    Time  8    Period  Weeks    Status  Revised    Target Date  03/03/20      PT LONG TERM GOAL #2   Title  walk with  minimal gait deficits for for 30 minutes in a store with pain decreased >/= 60%    Time  8    Period  Weeks    Status  Revised    Target Date  03/03/20      PT LONG TERM GOAL #3   Title  understand posture and correct  body mechanics with daily tasks to decreased strain on back and hips    Time  8    Period  Weeks    Status  Revised    Target Date  03/03/20      PT LONG TERM GOAL #4   Title  return to  advance HEP with minimal pain to furher increase strength    Time  8    Period  Weeks    Status  Revised    Target Date  03/03/20            Plan - 01/07/20 [id number]    Clinical Impression Statement  Patient has increased in right hip mobility. Patient is able to walk further with less pain. Patient pain is now intermittent. Patient has increased strength in right hip due to improved ROM. Patient reports her overall pain is 35% better. Patient walks with improved hip extension, increased trunk sway, decreased righthip extension, and less hip abduction. Patient is working on her core and hip strength while reducing her pain and improve mobility. Patient will benefit from skilled therapy to elongate the muscles in back and hip, increased strength, and improve right hip ROM.    Stability/Clinical Decision Making  Stable/Uncomplicated    Rehab Potential  Good    PT Frequency  2x / week    PT Duration  8 weeks    PT Treatment/Interventions  ADLs/Self Care Home Management;Cryotherapy;Electrical Stimulation;Iontophoresis 4mg /ml Dexamethasone;Moist Heat;Ultrasound;Gait training;Therapeutic activities;Therapeutic exercise;Balance training;Neuromuscular re-education;Manual techniques;Patient/family education;Dry needling    PT Next Visit Plan  dry needling to lumbar, and quads; hip mobilization, measure ROM; braiding in standing    PT Home Exercise Plan  Access Code: [email or web address]    Recommended Other Services  sent MD renewal    Consulted and Agree with Plan of Care  Patient       Patient will  benefit from skilled therapeutic intervention in order to improve the following deficits and impairments:  Abnormal gait, Pain, Increased muscle spasms, Decreased mobility, Decreased activity tolerance, Decreased range of motion, Decreased strength, Impaired flexibility, Difficulty walking  Visit Diagnosis: Pain in right hip  Difficulty in walking, not elsewhere classified  Pain in left hip  Acute midline low back pain without sciatica     Problem List Patient Active Problem List   Diagnosis Date Noted  . Vaginal burning 06/22/2019  . Anxiety 06/19/2018  . Ductal carcinoma in situ (DCIS) of right breast 07/08/2017  . Stress 04/09/2017  . Health care maintenance 06/14/2015  . Numbness of foot 06/09/2014  . Neck nodule 12/29/2013  . 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 Simple partial seizures ([place]) 09/11/2013  . Encounter for therapeutic drug monitoring 09/11/2013  . Hypokalemia 06/24/2013  . Essential hypertension, benign 01/11/2013  . Hypercholesterolemia 01/11/2013  . Seizure disorder ([place]) 01/11/2013  . Gastric ulcer 01/11/2013  . Hypothyroidism 01/11/2013  . Obstructive sleep apnea 01/11/2013  Earlie Counts, PT 01/07/20 9:32 AM   [facility] [address] [place], [place], [address] Phone: [phone]   Fax:  [phone]  Name: Jennifer Pratt MRN: [id number] Date of Birth: June 30, 1961

## 2020-01-10 ENCOUNTER — Ambulatory Visit: Payer: BC Managed Care – PPO | Admitting: Physical Therapy

## 2020-01-10 ENCOUNTER — Other Ambulatory Visit: Payer: Self-pay

## 2020-01-10 ENCOUNTER — Encounter: Payer: Self-pay | Admitting: Physical Therapy

## 2020-01-10 DIAGNOSIS — R262 Difficulty in walking, not elsewhere classified: Secondary | ICD-10-CM | POA: Diagnosis not present

## 2020-01-10 DIAGNOSIS — M545 Low back pain, unspecified: Secondary | ICD-10-CM

## 2020-01-10 DIAGNOSIS — M25551 Pain in right hip: Secondary | ICD-10-CM | POA: Diagnosis not present

## 2020-01-10 DIAGNOSIS — M25552 Pain in left hip: Secondary | ICD-10-CM | POA: Diagnosis not present

## 2020-01-10 NOTE — Therapy (Signed)
[facility] [address], [place], [place], [address] Phone: [phone]   Fax:  [phone]  Physical Therapy Treatment  Patient Details  Name: Jennifer Pratt MRN: [id number] Date of Birth: 02-15-1961 Referring Provider (PT): Normajean Glasgow   Encounter Date: 01/10/2020  PT End of Session - 01/10/20 0929    Visit Number  14    Date for PT Re-Evaluation  03/03/20    Authorization Type  BCBS    PT Start Time  0845    PT Stop Time  0925    PT Time Calculation (min)  40 min    Activity Tolerance  Patient tolerated treatment well;No increased pain    Behavior During Therapy  WFL for tasks assessed/performed       Past Medical History:  Diagnosis Date  . Arthritis   . 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  . Hypercholesterolemia   . Hypertension   . Hypothyroidism   . 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 Sleep apnea    USES CPAP  . Ulcer    Gastric    Past Surgical History:  Procedure Laterality Date  . BREAST BIOPSY Right 06/28/2017   Stereo affirm- UOQ/DUCTAL CARCINOMA IN SITU, HIGH NUCLEAR GRADE WITH COMEDONECROSIS   . BREAST EXCISIONAL BIOPSY Right 07/17/2017   WIde excision upper outer quadrant DCIS  Dr. Bary Castilla  . BREAST LUMPECTOMY Right 07/17/2017   DCIS mammosite  . CESAREAN SECTION    . COLONOSCOPY N/A 10/05/2016   Procedure: COLONOSCOPY;  Surgeon: Manya Silvas, MD;  Location: [facility] ENDOSCOPY;  Service: Endoscopy;  Laterality: N/A;  Zosyn IVPB  . JOINT REPLACEMENT    . MASTECTOMY, PARTIAL Right 07/17/2017   Procedure: MASTECTOMY PARTIAL;  Surgeon: Robert Bellow, MD;  Location: [facility] ORS;  Service: General;  Laterality: Right;  . thumb surgery Right 2005   AND CTR  . THYROIDECTOMY  1988  . TOTAL KNEE ARTHROPLASTY Left 2012    There were no vitals filed for this visit.  Subjective Assessment - 01/10/20 0851    Subjective   I still wear the SI belt and it makes me feel more stable. I was sore after last visit. I am able to bring my leg over the other leg with greater ease. I can almost get my shoe on.    Pertinent History  left TKA    Limitations  Lifting;Standing;Walking    How long can you stand comfortably?  difficulty > 10 minutes    How long can you walk comfortably?  had to use a cane earlier in the summer    Patient Stated Goals  have less pain, walk better    Currently in Pain?  Yes    Pain Score  3     Pain Location  Hip    Pain Orientation  Right    Pain Descriptors / Indicators  Dull    Pain Type  Acute pain    Pain Radiating Towards  down the right thigh    Pain Onset  More than a month ago    Pain Frequency  Intermittent    Aggravating Factors   walking, standing    Pain Relieving Factors  braces    Effect of Pain on Daily Activities  limits    Multiple Pain Sites  Yes    Pain Score  6    Pain Location  Sacrum  Pain Orientation  Left    Pain Descriptors / Indicators  Dull;Stabbing    Pain Onset  More than a month ago    Pain Frequency  Intermittent    Aggravating Factors   walking, standing in one place    Pain Relieving Factors  sit                       [facility] Adult PT Treatment/Exercise - 01/10/20 0001      Knee/Hip Exercises: Stretches   Hip Flexor Stretch  Both;1 rep;60 seconds    Hip Flexor Stretch Limitations  foam roller    Piriformis Stretch  Right;Left;60 seconds    Piriformis Stretch Limitations  foam roll      Knee/Hip Exercises: Aerobic   Nustep  level 2 for 6 min hile assessing patient      Manual Therapy   Manual Therapy  Joint mobilization;Soft tissue mobilization;Myofascial release    Joint Mobilization  using the mulligan belt left hip jointmobilization for inferior, lateral and distraction glide    Soft tissue mobilization  to the left lumbar paraspinals, left gluteals and lateral left hip    Myofascial Release  used assistive device to reduce  fascial tightness in the left lumbar, gluteal and piriformis    Other Manual Therapy  manually stretched left posterior hip and left lumbar               PT Short Term Goals - 11/13/19 1417      PT SHORT TERM GOAL #1   Title  independent with initial HEP    Status  Achieved        PT Long Term Goals - 01/07/20 [id number]      PT LONG TERM GOAL #1   Title  decrease pain >/= 60% in bilateral hips and sacrum due to decreased gait deficits    Baseline  decreased by 35%    Time  8    Period  Weeks    Status  Revised    Target Date  03/03/20      PT LONG TERM GOAL #2   Title  walk with minimal gait deficits for for 30 minutes in a store with pain decreased >/= 60%    Time  8    Period  Weeks    Status  Revised    Target Date  03/03/20      PT LONG TERM GOAL #3   Title  understand posture and correct  body mechanics with daily tasks to decreased strain on back and hips    Time  8    Period  Weeks    Status  Revised    Target Date  03/03/20      PT LONG TERM GOAL #4   Title  return to  advance HEP with minimal pain to furher increase strength    Time  8    Period  Weeks    Status  Revised    Target Date  03/03/20            Plan - 01/10/20 0929    Clinical Impression Statement  Today patient had increased pain in left hip and low back with increased fascial tightness. The right hip was feeling good. Patient had tightness in the left gluteals medius. Patient was able to walk with less of a limp on the left after treatment. Patient had full left hip ROM after treatment. Patient will benefit from skilled therapy to elongate the  muscles in back and hip , increased strength, and improve right hip ROM.    Stability/Clinical Decision Making  Stable/Uncomplicated    Rehab Potential  Good    PT Frequency  2x / week    PT Duration  8 weeks    PT Treatment/Interventions  ADLs/Self Care Home Management;Cryotherapy;Electrical Stimulation;Iontophoresis 4mg /ml Dexamethasone;Moist  Heat;Ultrasound;Gait training;Therapeutic activities;Therapeutic exercise;Balance training;Neuromuscular re-education;Manual techniques;Patient/family education;Dry needling    PT Next Visit Plan  dry needling to lumbar, and quads; hip mobilization, measure ROM; braiding in standing    PT Home Exercise Plan  Access Code: [email or web address]    Consulted and Agree with Plan of Care  Patient       Patient will benefit from skilled therapeutic intervention in order to improve the following deficits and impairments:  Abnormal gait, Pain, Increased muscle spasms, Decreased mobility, Decreased activity tolerance, Decreased range of motion, Decreased strength, Impaired flexibility, Difficulty walking  Visit Diagnosis: Pain in right hip  Difficulty in walking, not elsewhere classified  Pain in left hip  Acute midline low back pain without sciatica     Problem List Patient Active Problem List   Diagnosis Date Noted  . Vaginal burning 06/22/2019  . Anxiety 06/19/2018  . Ductal carcinoma in situ (DCIS) of right breast 07/08/2017  . Stress 04/09/2017  . Health care maintenance 06/14/2015  . Numbness of foot 06/09/2014  . Neck nodule 12/29/2013  . 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 Simple partial seizures ([place]) 09/11/2013  . Encounter for therapeutic drug monitoring 09/11/2013  . Hypokalemia 06/24/2013  . Essential hypertension, benign 01/11/2013  . Hypercholesterolemia 01/11/2013  . Seizure disorder ([place]) 01/11/2013  . Gastric ulcer 01/11/2013  . Hypothyroidism 01/11/2013  . Obstructive sleep apnea 01/11/2013    Earlie Counts, PT 01/10/20 9:32 AM   [facility] [address] [place], [place], [address] Phone: [phone]   Fax:  [phone]  Name: Jennifer Pratt MRN: [id number] Date of Birth: 30-Mar-1961

## 2020-01-13 ENCOUNTER — Encounter: Payer: Self-pay | Admitting: Neurology

## 2020-01-13 ENCOUNTER — Other Ambulatory Visit: Payer: Self-pay

## 2020-01-13 ENCOUNTER — Ambulatory Visit: Payer: BC Managed Care – PPO | Admitting: Neurology

## 2020-01-13 VITALS — BP 132/80 | HR 99 | Temp 96.6°F | Ht 68.0 in | Wt 238.6 lb

## 2020-01-13 DIAGNOSIS — G40909 Epilepsy, unspecified, not intractable, without status epilepticus: Secondary | ICD-10-CM | POA: Diagnosis not present

## 2020-01-13 MED ORDER — CARBAMAZEPINE ER 200 MG PO CP12: ORAL_CAPSULE | ORAL | 3 refills | Status: DC

## 2020-01-13 NOTE — Patient Instructions (Signed)
It was great to meet you today! Continue taking medication as prescribed. See you in 1 year :)

## 2020-01-13 NOTE — Progress Notes (Signed)
PATIENT: Jennifer Pratt DOB: 09/24/1961  REASON FOR VISIT: follow up HISTORY FROM: patient  HISTORY OF PRESENT ILLNESS: Today 01/13/20  Jennifer Pratt is a 59 year old female with history of seizures, well controlled on Carbatrol.  Her seizures used to be associated with her menstrual cycle, but she has not had a period in several years.  She had routine lab work in August 2020, carbamazepine level was 5.6, liver function was normal.  She has not had a seizure in about 20 years.  She is currently in physical therapy for leg pain, lumbar stenosis.  She is considering getting a neurosurgical opinion.  She works full-time, in [employer].  She drives about 80 miles back and forth to work.  She needs a refill medication.  She presents today for evaluation unaccompanied.  HISTORY HISTORY OF PRESENT ILLNESS:UPDATE 01/10/2019 CM Jennifer Pratt is a 59 year old right-handed white female with a history of seizures, well controlled on Carbatrol.  She returns for yearly follow-up.  The patient indicates that her last seizure event was 18 years ago. The patient has not had any seizures since being on Carbatrol. Her seizures used to be associated with her menstrual cycle, but she has not had a period in over five years.  She continues to operate a motor vehicle.  Recent labs drawn last month reviewed.  She returns for reevaluation   REVIEW OF SYSTEMS: Out of a complete 14 system review of symptoms, the patient complains only of the following symptoms, and all other reviewed systems are negative.  Seizures  ALLERGIES: Allergies  Allergen Reactions  . Lisinopril Other (See Comments)    UNKNOWN     HOME MEDICATIONS: Outpatient Medications Prior to Visit  Medication Sig Dispense Refill  . amLODipine (NORVASC) 5 MG tablet TAKE 1 TABLET BY MOUTH EVERY DAY 90 tablet 1  . diclofenac (VOLTAREN) 75 MG EC tablet Take 1 tablet (75 mg total) by mouth 2 (two) times daily. 60 tablet 0  . DULoxetine (CYMBALTA) 60 MG  capsule Take 1 capsule (60 mg total) by mouth daily. 90 capsule 1  . fluticasone (FLONASE) 50 MCG/ACT nasal spray Place 1 spray into both nostrils daily as needed for allergies or rhinitis.    Marland Kitchen gabapentin (NEURONTIN) 300 MG capsule Take 600 mg by mouth at bedtime.    Marland Kitchen levothyroxine (SYNTHROID) 175 MCG tablet TAKE 1 TABLET BY MOUTH EVERY DAY 90 tablet 2  . pravastatin (PRAVACHOL) 20 MG tablet TAKE 1 TABLET BY MOUTH EVERY DAY 90 tablet 3  . raloxifene (EVISTA) 60 MG tablet TAKE 1 TABLET BY MOUTH EVERY DAY 90 tablet 4  . sertraline (ZOLOFT) 50 MG tablet Take 1 tablet (50 mg total) by mouth daily. 90 tablet 0  . valsartan (DIOVAN) 160 MG tablet TAKE 1 TABLET BY MOUTH EVERY DAY 90 tablet 2  . carbamazepine (CARBATROL) 200 MG 12 hr capsule TAKE 1 CAPSULE BY MOUTH TWICE A DAY 180 capsule 3   No facility-administered medications prior to visit.    PAST MEDICAL HISTORY: Past Medical History:  Diagnosis Date  . Arthritis   . 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  . Hypercholesterolemia   . Hypertension   . Hypothyroidism   . 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 Sleep apnea    USES CPAP  . Ulcer    Gastric    PAST SURGICAL HISTORY: Past Surgical History:  Procedure Laterality Date  .  BREAST BIOPSY Right 06/28/2017   Stereo affirm- UOQ/DUCTAL CARCINOMA IN SITU, HIGH NUCLEAR GRADE WITH COMEDONECROSIS   . BREAST EXCISIONAL BIOPSY Right 07/17/2017   WIde excision upper outer quadrant DCIS  Dr. Bary Castilla  . BREAST LUMPECTOMY Right 07/17/2017   DCIS mammosite  . CESAREAN SECTION    . COLONOSCOPY N/A 10/05/2016   Procedure: COLONOSCOPY;  Surgeon: Manya Silvas, MD;  Location[facility] END[facility]COPY;  Service: Endoscopy;  Laterality: N/A;  Zosyn IVPB  . JOINT REPLACEMENT    . MASTECTOMY, PARTIAL Right 07/17/2017   Procedure: MASTECTOMY PARTIAL;  Surgeon: Robert Bellow, MD;  Location: [facility] ORS;  Service: General;  Laterality:  Right;  . thumb surgery Right 2005   AND CTR  . THYROIDECTOMY  1988  . TOTAL KNEE ARTHROPLASTY Left 2012    FAMILY HISTORY: Family History  Problem Relation Age of Onset  . Diabetes Father   . Dementia Mother   . Breast cancer Neg Hx   . Colon cancer Neg Hx     SOCIAL HISTORY: Social History   Socioeconomic History  . Marital status: Married    Spouse name: Not on file  . Number of children: 1  . Years of education: Not on file  . Highest education level: Not on file  Occupational History    Employer: [employer]  Tobacco Use  . Smoking status: Former Smoker    Packs/day: 0.25    Years: 2.00    Pack years: 0.50    Types: Cigarettes    Quit date: 08/01/2017    Years since quitting: 2.4  . Smokeless tobacco: Never Used  . Tobacco comment: PT ONLY SMOKES SOCIALLY-SHE MAY GO A FEW MONTHS WITHOUT EVER SMOKING   Substance and Sexual Activity  . Alcohol use: Yes    Alcohol/week: 0.0 standard drinks    Comment: Socially  . Drug use: No  . Sexual activity: Yes  Other Topics Concern  . Not on file  Social History Narrative  . Not on file   Social Determinants of Health   Financial Resource Strain:   . Difficulty of Paying Living Expenses: Not on file  Food Insecurity:   . Worried About Charity fundraiser in the Last Year: Not on file  . Ran Out of Food in the Last Year: Not on file  Transportation Needs:   . Lack of Transportation (Medical): Not on file  . Lack of Transportation (Non-Medical): Not on file  Physical Activity:   . Days of Exercise per Week: Not on file  . Minutes of Exercise per Session: Not on file  Stress:   . Feeling of Stress : Not on file  Social Connections:   . Frequency of Communication with Friends and Family: Not on file  . Frequency of Social Gatherings with Friends and Family: Not on file  . Attends Religious Services: Not on file  . Active Member of Clubs or Organizations: Not on file  . Attends Archivist Meetings:  Not on file  . Marital Status: Not on file  Intimate Partner Violence:   . Fear of Current or Ex-Partner: Not on file  . Emotionally Abused: Not on file  . Physically Abused: Not on file  . Sexually Abused: Not on file   PHYSICAL EXAM  Vitals:   01/13/20 1039  BP: 132/80  Pulse: 99  Temp: (!) 96.6 F (35.9 C)  TempSrc: Oral  Weight: 238 lb 9.6 oz (108.2 kg)  Height: 5\' 8"  (1.727 m)  Body mass index is 36.28 kg/m.  Generalized: Well developed, in no acute distress   Neurological examination  Mentation: Alert oriented to time, place, history taking. Follows all commands speech and language fluent Cranial nerve II-XII: Pupils were equal round reactive to light. Extraocular movements were full, visual field were full on confrontational test. Facial sensation and strength were normal. Head turning and shoulder shrug  were normal and symmetric. Motor: The motor testing reveals 5 over 5 strength of all 4 extremities. Good symmetric motor tone is noted throughout.  Sensory: Sensory testing is intact to soft touch on all 4 extremities. No evidence of extinction is noted.  Coordination: Cerebellar testing reveals good finger-nose-finger and heel-to-shin bilaterally.  Gait and station: Able to rise from seated position without pushoff, slight limp on the right. Tandem gait is normal. Romberg is negative. No drift is seen.  Reflexes: Deep tendon reflexes are symmetric bilaterally  DIAGNOSTIC DATA (LABS, IMAGING, TESTING) - I reviewed patient records, labs, notes, testing and imaging myself where available.  Lab Results  Component Value Date   WBC 4.9 12/06/2018   HGB 13.3 12/06/2018   HCT 39.1 12/06/2018   MCV 91.5 12/06/2018   PLT 233.0 12/06/2018      Component Value Date/Time   NA 143 07/04/2019 0858   K 3.8 07/04/2019 0858   CL 104 07/04/2019 0858   CO2 30 07/04/2019 0858   GLUCOSE 108 (H) 07/04/2019 0858   BUN 17 07/04/2019 0858   CREATININE 0.87 07/04/2019 0858    CALCIUM 9.4 07/04/2019 0858   PROT 6.8 07/04/2019 0858   ALBUMIN 4.6 07/04/2019 0858   AST 16 07/04/2019 0858   ALT 20 07/04/2019 0858   ALKPHOS 59 07/04/2019 0858   BILITOT 0.4 07/04/2019 0858   GFRNONAA >60 08/12/2011 0512   GFRAA >60 08/12/2011 0512   Lab Results  Component Value Date   CHOL 203 (H) 07/04/2019   HDL 59.30 07/04/2019   LDLCALC 116 (H) 07/04/2019   LDLDIRECT 112.0 06/04/2015   TRIG 142.0 07/04/2019   CHOLHDL 3 07/04/2019   Lab Results  Component Value Date   HGBA1C 5.2 07/04/2019   Lab Results  Component Value Date   VITAMINB12 >1500 (H) 06/05/2014   Lab Results  Component Value Date   TSH 1.93 07/04/2019    ASSESSMENT AND PLAN 59 y.o. year old female  has a past medical history of Arthritis, Breast cancer ([place]) (06/28/2017), Hypercholesterolemia, Hypertension, Hypothyroidism, Seizures ([place]), Sleep apnea, and Ulcer. here with:  1.  Seizures  Her seizures remain under excellent control.  She has not had any seizures in about 20 years.  She will remain on Carbatrol 200 mg twice daily.  I reviewed lab work from August 2020.  She will follow-up in 1 year or sooner if needed, call for recurrent seizure.  We discussed in the future, it would be okay to have her primary doctor refill the medication.  I spent 15 minutes with the patient. 50% of this time was spent discussing her plan of care.  Butler Denmark, AGNP-C, DNP 01/13/2020, 11:04 AM [facility] [address] [place], [place] [address] [phone]

## 2020-01-13 NOTE — Progress Notes (Signed)
I have read the note, and I agree with the clinical assessment and plan.  Dorna Mallet K Maryanne Huneycutt   

## 2020-01-15 ENCOUNTER — Other Ambulatory Visit: Payer: Self-pay

## 2020-01-15 ENCOUNTER — Ambulatory Visit: Payer: BC Managed Care – PPO | Admitting: Physical Therapy

## 2020-01-15 ENCOUNTER — Encounter: Payer: Self-pay | Admitting: Physical Therapy

## 2020-01-15 DIAGNOSIS — M545 Low back pain, unspecified: Secondary | ICD-10-CM

## 2020-01-15 DIAGNOSIS — R262 Difficulty in walking, not elsewhere classified: Secondary | ICD-10-CM | POA: Diagnosis not present

## 2020-01-15 DIAGNOSIS — M25551 Pain in right hip: Secondary | ICD-10-CM | POA: Diagnosis not present

## 2020-01-15 DIAGNOSIS — M25552 Pain in left hip: Secondary | ICD-10-CM

## 2020-01-15 NOTE — Therapy (Signed)
[facility] [address], [place], [place], [address] Phone: [phone]   Fax:  [phone]  Physical Therapy Treatment  Patient Details  Name: Jennifer Pratt MRN: [id number] Date of Birth: 1961-04-28 Referring Provider (PT): Normajean Glasgow   Encounter Date: 01/15/2020  PT End of Session - 01/15/20 0924    Visit Number  15    Date for PT Re-Evaluation  03/03/20    Authorization Type  BCBS    PT Start Time  0845    PT Stop Time  0925    PT Time Calculation (min)  40 min    Activity Tolerance  Patient tolerated treatment well;No increased pain    Behavior During Therapy  WFL for tasks assessed/performed       Past Medical History:  Diagnosis Date  . Arthritis   . 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  . Hypercholesterolemia   . Hypertension   . Hypothyroidism   . 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 Sleep apnea    USES CPAP  . Ulcer    Gastric    Past Surgical History:  Procedure Laterality Date  . BREAST BIOPSY Right 06/28/2017   Stereo affirm- UOQ/DUCTAL CARCINOMA IN SITU, HIGH NUCLEAR GRADE WITH COMEDONECROSIS   . BREAST EXCISIONAL BIOPSY Right 07/17/2017   WIde excision upper outer quadrant DCIS  Dr. Bary Castilla  . BREAST LUMPECTOMY Right 07/17/2017   DCIS mammosite  . CESAREAN SECTION    . COLONOSCOPY N/A 10/05/2016   Procedure: COLONOSCOPY;  Surgeon: Manya Silvas, MD;  Location: [facility] ENDOSCOPY;  Service: Endoscopy;  Laterality: N/A;  Zosyn IVPB  . JOINT REPLACEMENT    . MASTECTOMY, PARTIAL Right 07/17/2017   Procedure: MASTECTOMY PARTIAL;  Surgeon: Robert Bellow, MD;  Location: [facility] ORS;  Service: General;  Laterality: Right;  . thumb surgery Right 2005   AND CTR  . THYROIDECTOMY  1988  . TOTAL KNEE ARTHROPLASTY Left 2012    There were no vitals filed for this visit.  Subjective Assessment - 01/15/20 0849    Subjective   After the last treatment I had increased pain so prefer not to aggravate the left side. I did not sleep well over the weekend. I see the chiropractor today    Pertinent History  left TKA    Limitations  Lifting;Standing;Walking    How long can you stand comfortably?  difficulty > 10 minutes    How long can you walk comfortably?  had to use a cane earlier in the summer    Patient Stated Goals  have less pain, walk better    Currently in Pain?  Yes    Pain Score  2     Pain Location  Hip    Pain Orientation  Right    Pain Descriptors / Indicators  Dull    Pain Type  Acute pain    Pain Onset  More than a month ago    Pain Frequency  Intermittent    Aggravating Factors   walking, standing    Pain Relieving Factors  braces    Multiple Pain Sites  Yes    Pain Score  5    Pain Location  Sacrum    Pain Orientation  Left    Pain Descriptors / Indicators  Dull;Stabbing    Pain Type  Acute pain    Pain Onset  More than a month  ago    Pain Frequency  Intermittent    Aggravating Factors   walking, standing in one place    Pain Relieving Factors  sit                       [facility] Adult PT Treatment/Exercise - 01/15/20 0001      Knee/Hip Exercises: Stretches   Active Hamstring Stretch  Right;Left;1 rep;30 seconds    Active Hamstring Stretch Limitations  sitting    Piriformis Stretch  Right;Left;30 seconds;2 reps    Piriformis Stretch Limitations  sitting    Other Knee/Hip Stretches  lunge stretch with therapist guiding the hip anteriorly 30 sec each way 2 times      Knee/Hip Exercises: Aerobic   Elliptical  level 1 for 5 minutes      Knee/Hip Exercises: Standing   Side Lunges  Right;Left;1 set;5 reps;3 seconds    Side Lunges Limitations  working on technique    Functional Squat  1 set;10 reps;5 seconds    Functional Squat Limitations  holding 5#, working on weight on right leg and correct posture    Other Standing Knee Exercises  Braiding 8 feet each way 2 times each;  tandem walk 8 feet 4 times    Other Standing Knee Exercises  high step walking through the ladder with reduced base of support      Manual Therapy   Manual Therapy  Soft tissue mobilization    Soft tissue mobilization  using the addaday to massage the right gluteal and around the right hip             PT Education - 01/15/20 0924    Education Details  discussed with patient about purchasing a heel lift for the left leg    Person(s) Educated  Patient    Methods  Explanation    Comprehension  Verbalized understanding       PT Short Term Goals - 11/13/19 1417      PT SHORT TERM GOAL #1   Title  independent with initial HEP    Status  Achieved        PT Long Term Goals - 01/15/20 0853      PT LONG TERM GOAL #1   Title  decrease pain >/= 60% in bilateral hips and sacrum due to decreased gait deficits    Baseline  decreased by 35%    Time  8    Period  Weeks    Status  On-going      PT LONG TERM GOAL #2   Title  walk with minimal gait deficits for for 30 minutes in a store with pain decreased >/= 60%    Time  8    Period  Weeks    Status  On-going      PT LONG TERM GOAL #3   Title  understand posture and correct  body mechanics with daily tasks to decreased strain on back and hips    Time  8    Period  Weeks    Status  On-going      PT LONG TERM GOAL #4   Title  return to  advance HEP with minimal pain to furher increase strength    Time  8    Period  Weeks    Status  On-going            Plan - 01/15/20 [id number]    Clinical Impression Statement  Patient is now able to bring  her right leg on the left leg without assisting and does better with her back supported. Patient is walking with reduce base of support. Patient has tight right hip extensors. Patient was able to do the elliptical with greater ease. Patient was able to do braiding with minimal difficulty. Patient has less pain in right hip. Patient will benefit from skilled therapy to elongate the muscles  inback and hip, increased strength and improve right hip ROM.    Stability/Clinical Decision Making  Stable/Uncomplicated    Rehab Potential  Good    PT Frequency  2x / week    PT Duration  8 weeks    PT Treatment/Interventions  ADLs/Self Care Home Management;Cryotherapy;Electrical Stimulation;Iontophoresis 4mg /ml Dexamethasone;Moist Heat;Ultrasound;Gait training;Therapeutic activities;Therapeutic exercise;Balance training;Neuromuscular re-education;Manual techniques;Patient/family education;Dry needling    PT Next Visit Plan  work on hip and knee strength, work on hip mobiity, continues with standing strength; soft tissue work as needed    PT Home Exercise Plan  Access Code: [email or web address]    Recommended Other Services  MD signed renewal    Consulted and Agree with Plan of Care  Patient       Patient will benefit from skilled therapeutic intervention in order to improve the following deficits and impairments:  Abnormal gait, Pain, Increased muscle spasms, Decreased mobility, Decreased activity tolerance, Decreased range of motion, Decreased strength, Impaired flexibility, Difficulty walking  Visit Diagnosis: Pain in right hip  Difficulty in walking, not elsewhere classified  Pain in left hip  Acute midline low back pain without sciatica     Problem List Patient Active Problem List   Diagnosis Date Noted  . Vaginal burning 06/22/2019  . Anxiety 06/19/2018  . Ductal carcinoma in situ (DCIS) of right breast 07/08/2017  . Stress 04/09/2017  . Health care maintenance 06/14/2015  . Numbness of foot 06/09/2014  . Neck nodule 12/29/2013  . 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 Encounter for therapeutic drug monitoring 09/11/2013  . Hypokalemia 06/24/2013  . Essential hypertension, benign 01/11/2013  . Hypercholesterolemia 01/11/2013  . Seizure disorder ([place]) 01/11/2013  . Gastric ulcer 01/11/2013  . Hypothyroidism 01/11/2013   . Obstructive sleep apnea 01/11/2013    Earlie Counts, PT 01/15/20 9:28 AM   [facility] [address], [place], [place], [address] Phone: [phone]   Fax:  [phone]  Name: ALDENE HIRATA MRN: [id number] Date of Birth: 10/06/1961

## 2020-01-21 ENCOUNTER — Ambulatory Visit: Payer: BC Managed Care – PPO | Admitting: Physical Therapy

## 2020-01-21 ENCOUNTER — Encounter: Payer: Self-pay | Admitting: Physical Therapy

## 2020-01-21 ENCOUNTER — Other Ambulatory Visit: Payer: Self-pay

## 2020-01-21 DIAGNOSIS — M25552 Pain in left hip: Secondary | ICD-10-CM | POA: Diagnosis not present

## 2020-01-21 DIAGNOSIS — M545 Low back pain, unspecified: Secondary | ICD-10-CM

## 2020-01-21 DIAGNOSIS — R262 Difficulty in walking, not elsewhere classified: Secondary | ICD-10-CM | POA: Diagnosis not present

## 2020-01-21 DIAGNOSIS — M25551 Pain in right hip: Secondary | ICD-10-CM

## 2020-01-21 NOTE — Therapy (Signed)
[facility] [address], [place], [place], [address] Phone: [phone]   Fax:  [phone]  Physical Therapy Treatment  Patient Details  Name: Jennifer Pratt MRN: [id number] Date of Birth: 1961/05/10 Referring Provider (PT): Normajean Glasgow   Encounter Date: 01/21/2020  PT End of Session - 01/21/20 0923    Visit Number  16    Date for PT Re-Evaluation  03/03/20    Authorization Type  BCBS    PT Start Time  0845    PT Stop Time  0923    PT Time Calculation (min)  38 min    Activity Tolerance  Patient tolerated treatment well;No increased pain    Behavior During Therapy  WFL for tasks assessed/performed       Past Medical History:  Diagnosis Date  . Arthritis   . 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  . Hypercholesterolemia   . Hypertension   . Hypothyroidism   . 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 Sleep apnea    USES CPAP  . Ulcer    Gastric    Past Surgical History:  Procedure Laterality Date  . BREAST BIOPSY Right 06/28/2017   Stereo affirm- UOQ/DUCTAL CARCINOMA IN SITU, HIGH NUCLEAR GRADE WITH COMEDONECROSIS   . BREAST EXCISIONAL BIOPSY Right 07/17/2017   WIde excision upper outer quadrant DCIS  Dr. Bary Castilla  . BREAST LUMPECTOMY Right 07/17/2017   DCIS mammosite  . CESAREAN SECTION    . COLONOSCOPY N/A 10/05/2016   Procedure: COLONOSCOPY;  Surgeon: Manya Silvas, MD;  Location: [facility] END[facility]COPY;  Service: Endoscopy;  Laterality: N/A;  Zosyn IVPB  . JOINT REPLACEMENT    . MASTECTOMY, PARTIAL Right 07/17/2017   Procedure: MASTECTOMY PARTIAL;  Surgeon: Robert Bellow, MD;  Location: [facility] ORS;  Service: General;  Laterality: Right;  . thumb surgery Right 2005   AND CTR  . THYROIDECTOMY  1988  . TOTAL KNEE ARTHROPLASTY Left 2012    There were no vitals filed for this visit.  Subjective Assessment - 01/21/20 0846    Subjective   The pain is 50% better. I have trouble stretching the leg. I still have difficulty with putting my shoe on and putting my right leg on left. I saw the chiropractor last week and felt good. The left sacrum area is good.    Pertinent History  left TKA    Limitations  Lifting;Standing;Walking    How long can you stand comfortably?  difficulty > 10 minutes    How long can you walk comfortably?  had to use a cane earlier in the summer    Patient Stated Goals  have less pain, walk better    Currently in Pain?  Yes    Pain Score  5     Pain Location  Hip    Pain Orientation  Right    Pain Descriptors / Indicators  Dull    Pain Type  Acute pain    Pain Onset  More than a month ago    Pain Frequency  Intermittent    Aggravating Factors   walking, standing    Pain Relieving Factors  braces    Multiple Pain Sites  No                       [facility] Adult PT Treatment/Exercise - 01/21/20 0001      Knee/Hip  Exercises: Stretches   Hip Flexor Stretch  Right;1 rep;60 seconds    Hip Flexor Stretch Limitations  on step    Piriformis Stretch  Right;60 seconds;2 reps    Piriformis Stretch Limitations  with right leg on mat in standing    Gastroc Stretch  Right;2 reps;30 seconds    Gastroc Stretch Limitations  laying on mat with left leg on floor    Other Knee/Hip Stretches  lay on left side on ball and stretch the right side of the trunk and hip with therapist loosing up the right lumbar and low thoracic paraspinals      Knee/Hip Exercises: Aerobic   Elliptical  level 1 for 5 minutes      Manual Therapy   Manual Therapy  Soft tissue mobilization    Soft tissue mobilization  using the addaday to the right hamstring, gluteals, quadriceps and ITB       Trigger Point Dry Needling - 01/21/20 0001    Consent Given?  Yes    Education Handout Provided  Previously provided    Muscles Treated Lower Quadrant  Hamstring;Vastus lateralis;Rectus femoris;Vastus intermedius    Muscles Treated  Back/Hip  Gluteus minimus;Gluteus medius;Gluteus maximus    Rectus femoris Response  Twitch response elicited;Palpable increased muscle length    Vastus lateralis Response  Twitch response elicited;Palpable increased muscle length    Vastus intermedius Response  Twitch response elicited;Palpable increased muscle length    Hamstring Response  Twitch response elicited;Palpable increased muscle length    Gluteus Medius Response  Palpable increased muscle length;Twitch response elicited    Gluteus Maximus Response  Twitch response elicited;Palpable increased muscle length             PT Short Term Goals - 11/13/19 1417      PT SHORT TERM GOAL #1   Title  independent with initial HEP    Status  Achieved        PT Long Term Goals - 01/21/20 0926      PT LONG TERM GOAL #1   Title  decrease pain >/= 60% in bilateral hips and sacrum due to decreased gait deficits    Baseline  decreased by 50%    Time  8    Period  Weeks    Status  On-going      PT LONG TERM GOAL #2   Title  walk with minimal gait deficits for for 30 minutes in a store with pain decreased >/= 60%    Time  8    Period  Weeks    Status  On-going      PT LONG TERM GOAL #3   Title  understand posture and correct  body mechanics with daily tasks to decreased strain on back and hips    Time  8    Period  Weeks    Status  On-going      PT LONG TERM GOAL #4   Title  return to  advance HEP with minimal pain to furher increase strength    Time  8    Period  Weeks    Status  On-going            Plan - 01/21/20 [id number]    Clinical Impression Statement  Patient did not have pain in the left sacrum hip area. Patient was able to walk taller and reduced base of support after therapy. Patient has tightness in the right quads, ITB , and right lumbar paraspinals. Patient reports her pain is 50%  better. Patient will benefit from skilled therapy to elongate the muscles in back and hip, increased strength and improve right  hip ROM.    Stability/Clinical Decision Making  Stable/Uncomplicated    Rehab Potential  Good    PT Frequency  2x / week    PT Duration  8 weeks    PT Treatment/Interventions  ADLs/Self Care Home Management;Cryotherapy;Electrical Stimulation;Iontophoresis 4mg /ml Dexamethasone;Moist Heat;Ultrasound;Gait training;Therapeutic activities;Therapeutic exercise;Balance training;Neuromuscular re-education;Manual techniques;Patient/family education;Dry needling    PT Next Visit Plan  work on hip and knee strength, work on hip mobiity, continues with standing strength; soft tissue work as needed    PT Home Exercise Plan  Access Code: [email or web address] and Agree with Plan of Care  Patient       Patient will benefit from skilled therapeutic intervention in order to improve the following deficits and impairments:  Abnormal gait, Pain, Increased muscle spasms, Decreased mobility, Decreased activity tolerance, Decreased range of motion, Decreased strength, Impaired flexibility, Difficulty walking  Visit Diagnosis: Pain in right hip  Difficulty in walking, not elsewhere classified  Pain in left hip  Acute midline low back pain without sciatica     Problem List Patient Active Problem List   Diagnosis Date Noted  . Vaginal burning 06/22/2019  . Anxiety 06/19/2018  . Ductal carcinoma in situ (DCIS) of right breast 07/08/2017  . Stress 04/09/2017  . Health care maintenance 06/14/2015  . Numbness of foot 06/09/2014  . Neck nodule 12/29/2013  . 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 Simple partial seizures ([place]) 09/11/2013  . Encounter for therapeutic drug monitoring 09/11/2013  . Hypokalemia 06/24/2013  . Essential hypertension, benign 01/11/2013  . Hypercholesterolemia 01/11/2013  . Seizure disorder ([place]) 01/11/2013  . Gastric ulcer 01/11/2013  . Hypothyroidism 01/11/2013  . Obstructive sleep apnea 01/11/2013    Earlie Counts, PT 01/21/20 9:28 AM   [facility] [address], [place], [place], [address] Phone: [phone]   Fax:  [phone]  Name: ATTIE LEVINE MRN: [id number] Date of Birth: [birth year]

## 2020-01-23 ENCOUNTER — Encounter: Payer: Self-pay | Admitting: Physical Therapy

## 2020-01-23 ENCOUNTER — Other Ambulatory Visit: Payer: Self-pay

## 2020-01-23 ENCOUNTER — Ambulatory Visit: Payer: BC Managed Care – PPO | Admitting: Physical Therapy

## 2020-01-23 DIAGNOSIS — R262 Difficulty in walking, not elsewhere classified: Secondary | ICD-10-CM

## 2020-01-23 DIAGNOSIS — M25552 Pain in left hip: Secondary | ICD-10-CM

## 2020-01-23 DIAGNOSIS — M545 Low back pain, unspecified: Secondary | ICD-10-CM

## 2020-01-23 DIAGNOSIS — M25551 Pain in right hip: Secondary | ICD-10-CM

## 2020-01-23 NOTE — Therapy (Signed)
[facility] [address] [place], [place], [address] Phone: [phone]   Fax:  [phone]  Physical Therapy Treatment  Patient Details  Name: Jennifer Pratt MRN: [id number] Date of Birth: 1961-03-21 Referring Provider (PT): Normajean Glasgow   Encounter Date: 01/23/2020  PT End of Session - 01/23/20 0927    Visit Number  17    Date for PT Re-Evaluation  03/03/20    Authorization Type  BCBS    PT Start Time  0845    PT Stop Time  0923    PT Time Calculation (min)  38 min    Activity Tolerance  Patient tolerated treatment well;No increased pain    Behavior During Therapy  WFL for tasks assessed/performed       Past Medical History:  Diagnosis Date  . Arthritis   . 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  . Hypercholesterolemia   . Hypertension   . Hypothyroidism   . 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 Sleep apnea    USES CPAP  . Ulcer    Gastric    Past Surgical History:  Procedure Laterality Date  . BREAST BIOPSY Right 06/28/2017   Stereo affirm- UOQ/DUCTAL CARCINOMA IN SITU, HIGH NUCLEAR GRADE WITH COMEDONECROSIS   . BREAST EXCISIONAL BIOPSY Right 07/17/2017   WIde excision upper outer quadrant DCIS  Dr. Bary Castilla  . BREAST LUMPECTOMY Right 07/17/2017   DCIS mammosite  . CESAREAN SECTION    . COLONOSCOPY N/A 10/05/2016   Procedure: COLONOSCOPY;  Surgeon: Manya Silvas, MD;  Location: [facility] ENDOSCOPY;  Service: Endoscopy;  Laterality: N/A;  Zosyn IVPB  . JOINT REPLACEMENT    . MASTECTOMY, PARTIAL Right 07/17/2017   Procedure: MASTECTOMY PARTIAL;  Surgeon: Robert Bellow, MD;  Location: [facility] ORS;  Service: General;  Laterality: Right;  . thumb surgery Right 2005   AND CTR  . THYROIDECTOMY  1988  . TOTAL KNEE ARTHROPLASTY Left 2012    There were no vitals filed for this visit.  Subjective Assessment - 01/23/20 0848    Subjective   I feel good from last visit. The dry needling helped. We are going to the beach this weekend and doing alot of walking.    Pertinent History  left TKA    Limitations  Lifting;Standing;Walking    How long can you stand comfortably?  difficulty > 10 minutes    How long can you walk comfortably?  had to use a cane earlier in the summer    Patient Stated Goals  have less pain, walk better    Currently in Pain?  Yes    Pain Score  4     Pain Location  Hip    Pain Orientation  Right    Pain Descriptors / Indicators  Dull    Pain Type  Acute pain    Pain Onset  More than a month ago    Pain Frequency  Intermittent    Aggravating Factors   walking and standing    Pain Relieving Factors  braces    Multiple Pain Sites  No                       [facility] Adult PT Treatment/Exercise - 01/23/20 0001      Self-Care   Self-Care  Other Self-Care Comments    Other Self-Care Comments   education on shoes  to support her foot, tieing shoes so they are snug on her foot, wearing her orthotics, when shopping for sandals not having an open back and can adjust sround the foot      Knee/Hip Exercises: Stretches   Active Hamstring Stretch  Right;2 reps;30 seconds    Hip Flexor Stretch  Right;2 reps;60 seconds    Hip Flexor Stretch Limitations  with left foot on higher step    Piriformis Stretch  Right;2 reps;60 seconds    Piriformis Stretch Limitations  with right leg on mat in standing and reaching overhead with the right arm; using the addaday on the right thoracolumbar junction and pirifomris;       Knee/Hip Exercises: Aerobic   Elliptical  level 2 for 5 minutes      Knee/Hip Exercises: Machines for Strengthening   Total Gym Leg Press  seat#8, recline #4; both legs 50# 30x with VC to fully straigthen knees; 55# 15x      Knee/Hip Exercises: Standing   Functional Squat  1 set;10 reps;5 seconds    Functional Squat Limitations  holding 5#, working on weight on right leg and correct posture     Other Standing Knee Exercises  walking backwards 8 feet 3 times      Knee/Hip Exercises: Seated   Long Arc Quad  Strengthening;Right;Left;1 set;Weights   30x each leg   Long Arc Quad Weight  5 lbs.    Long Arc Quad Limitations  VC go slowly               PT Short Term Goals - 11/13/19 1417      PT SHORT TERM GOAL #1   Title  independent with initial HEP    Status  Achieved        PT Long Term Goals - 01/21/20 0926      PT LONG TERM GOAL #1   Title  decrease pain >/= 60% in bilateral hips and sacrum due to decreased gait deficits    Baseline  decreased by 50%    Time  8    Period  Weeks    Status  On-going      PT LONG TERM GOAL #2   Title  walk with minimal gait deficits for for 30 minutes in a store with pain decreased >/= 60%    Time  8    Period  Weeks    Status  On-going      PT LONG TERM GOAL #3   Title  understand posture and correct  body mechanics with daily tasks to decreased strain on back and hips    Time  8    Period  Weeks    Status  On-going      PT LONG TERM GOAL #4   Title  return to  advance HEP with minimal pain to furher increase strength    Time  8    Period  Weeks    Status  On-going            Plan - 01/23/20 0915    Clinical Impression Statement  Patient is able to do the leg press and will need to increase the weight next time. Patient was able to put equal weight on both legs with squat. Patient is walking with increased right hip extension and reduction of right hip abduction. Patient will benefit from skilled therpay to elongate themuscles in back and hip, increased strength and improve right hip ROM.    Stability/Clinical Decision Making  Stable/Uncomplicated    Rehab Potential  Good    PT Frequency  2x / week    PT Duration  8 weeks    PT Treatment/Interventions  ADLs/Self Care Home Management;Cryotherapy;Electrical Stimulation;Iontophoresis 4mg /ml Dexamethasone;Moist Heat;Ultrasound;Gait training;Therapeutic  activities;Therapeutic exercise;Balance training;Neuromuscular re-education;Manual techniques;Patient/family education;Dry needling    PT Next Visit Plan  work on hip and knee strength, work on hip mobiity, continues with standing strength; soft tissue work as needed; see how it was at [employer] walking; increase the leg press to 55#    PT Home Exercise Plan  Access Code: [email or web address]    Consulted and Agree with Plan of Care  Patient       Patient will benefit from skilled therapeutic intervention in order to improve the following deficits and impairments:  Abnormal gait, Pain, Increased muscle spasms, Decreased mobility, Decreased activity tolerance, Decreased range of motion, Decreased strength, Impaired flexibility, Difficulty walking  Visit Diagnosis: Pain in right hip  Difficulty in walking, not elsewhere classified  Pain in left hip  Acute midline low back pain without sciatica     Problem List Patient Active Problem List   Diagnosis Date Noted  . Vaginal burning 06/22/2019  . Anxiety 06/19/2018  . Ductal carcinoma in situ (DCIS) of right breast 07/08/2017  . Stress 04/09/2017  . Health care maintenance 06/14/2015  . Numbness of foot 06/09/2014  . Neck nodule 12/29/2013  . 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 Simple partial seizures ([place]) 09/11/2013  . Encounter for therapeutic drug monitoring 09/11/2013  . Hypokalemia 06/24/2013  . Essential hypertension, benign 01/11/2013  . Hypercholesterolemia 01/11/2013  . Seizure disorder ([place]) 01/11/2013  . Gastric ulcer 01/11/2013  . Hypothyroidism 01/11/2013  . Obstructive sleep apnea 01/11/2013    Earlie Counts, PT 01/23/20 9:28 AM   [facility] [address], [place], [place], [address] Phone: [phone]   Fax:  [phone]  Name: Jennifer Pratt MRN: [id number] Date of Birth: 1961/09/09

## 2020-01-28 ENCOUNTER — Encounter: Payer: Self-pay | Admitting: Physical Therapy

## 2020-01-28 ENCOUNTER — Ambulatory Visit: Payer: BC Managed Care – PPO | Attending: Physical Medicine and Rehabilitation | Admitting: Physical Therapy

## 2020-01-28 ENCOUNTER — Other Ambulatory Visit: Payer: Self-pay

## 2020-01-28 DIAGNOSIS — R262 Difficulty in walking, not elsewhere classified: Secondary | ICD-10-CM | POA: Diagnosis not present

## 2020-01-28 DIAGNOSIS — M25551 Pain in right hip: Secondary | ICD-10-CM

## 2020-01-28 DIAGNOSIS — M25552 Pain in left hip: Secondary | ICD-10-CM | POA: Insufficient documentation

## 2020-01-28 DIAGNOSIS — M545 Low back pain, unspecified: Secondary | ICD-10-CM

## 2020-01-28 NOTE — Therapy (Signed)
[facility] [address], [place], [place], [address] Phone: [phone]   Fax:  [phone]  Physical Therapy Treatment  Patient Details  Name: Jennifer Pratt MRN: [id number] Date of Birth: 13-Oct-1961 Referring Provider (PT): Normajean Glasgow   Encounter Date: 01/28/2020  PT End of Session - 01/28/20 0922    Visit Number  18    Date for PT Re-Evaluation  03/03/20    Authorization Type  BCBS    PT Start Time  0845    PT Stop Time  0923    PT Time Calculation (min)  38 min    Activity Tolerance  Patient tolerated treatment well;No increased pain    Behavior During Therapy  WFL for tasks assessed/performed       Past Medical History:  Diagnosis Date  . Arthritis   . 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  . Hypercholesterolemia   . Hypertension   . Hypothyroidism   . 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 Sleep apnea    USES CPAP  . Ulcer    Gastric    Past Surgical History:  Procedure Laterality Date  . BREAST BIOPSY Right 06/28/2017   Stereo affirm- UOQ/DUCTAL CARCINOMA IN SITU, HIGH NUCLEAR GRADE WITH COMEDONECROSIS   . BREAST EXCISIONAL BIOPSY Right 07/17/2017   WIde excision upper outer quadrant DCIS  Dr. Bary Castilla  . BREAST LUMPECTOMY Right 07/17/2017   DCIS mammosite  . CESAREAN SECTION    . COLONOSCOPY N/A 10/05/2016   Procedure: COLONOSCOPY;  Surgeon: Manya Silvas, MD;  Location: [facility] ENDOSCOPY;  Service: Endoscopy;  Laterality: N/A;  Zosyn IVPB  . JOINT REPLACEMENT    . MASTECTOMY, PARTIAL Right 07/17/2017   Procedure: MASTECTOMY PARTIAL;  Surgeon: Robert Bellow, MD;  Location: [facility] ORS;  Service: General;  Laterality: Right;  . thumb surgery Right 2005   AND CTR  . THYROIDECTOMY  1988  . TOTAL KNEE ARTHROPLASTY Left 2012    There were no vitals filed for this visit.  Subjective Assessment - 01/28/20 0848    Subjective   I went to the beach and did alot of walking. I had pain at night.    Pertinent History  left TKA    Limitations  Lifting;Standing;Walking    How long can you stand comfortably?  difficulty > 10 minutes    How long can you walk comfortably?  had to use a cane earlier in the summer    Patient Stated Goals  have less pain, walk better    Currently in Pain?  Yes    Pain Score  4     Pain Location  Hip    Pain Orientation  Right    Pain Descriptors / Indicators  Dull    Pain Type  Acute pain    Pain Onset  More than a month ago    Pain Frequency  Intermittent    Aggravating Factors   walking and standing    Pain Relieving Factors  exercise and stretching    Multiple Pain Sites  Yes    Pain Score  4    Pain Location  Sacrum    Pain Orientation  Left    Pain Descriptors / Indicators  Dull;Stabbing    Pain Type  Acute pain    Pain Onset  More than a month ago    Pain Frequency  Intermittent  Aggravating Factors   walking, standing in one place    Pain Relieving Factors  sit         [facility] PT Assessment - 01/28/20 0001      Assessment   Medical Diagnosis  right hip and left hip pain    Referring Provider (PT)  Normajean Glasgow    Onset Date/Surgical Date  10/07/19    Prior Therapy  no      Precautions   Precautions  None      Restrictions   Weight Bearing Restrictions  No      Home Environment   Additional Comments  has stairs, was doing yardwork      Prior Function   Level of Independence  Independent    Vocation  Full time employment    Vocation Requirements  accounting    Leisure  rides motorcycles      Cognition   Overall Cognitive Status  Within Functional Limits for tasks assessed      Posture/Postural Control   Posture/Postural Control  Postural limitations    Posture Comments  hips flexed                   [facility] Adult PT Treatment/Exercise - 01/28/20 0001      Knee/Hip Exercises: Stretches   Hip Flexor Stretch  Right;2 reps;60 seconds;Left    Hip  Flexor Stretch Limitations  with left foot on higher step then reverse, Therapist helping keeping the knee extended    Piriformis Stretch  Right;2 reps;60 seconds    Piriformis Stretch Limitations  with right leg on mat in standing and reaching overhead with the right arm; using the addaday on the right thoracolumbar junction and pirifomris;     Other Knee/Hip Stretches  quadruped on red ball and stretch the latissimus and thoracolumbar fascia    Other Knee/Hip Stretches  lay on left side on ball and stretch the right side of the trunk and hip with therapist loosing up the right lumbar and low thoracic paraspinals   using addaday to the right lumbar and ITB     Knee/Hip Exercises: Aerobic   Elliptical  level 2 for 5 minutes      Knee/Hip Exercises: Machines for Strengthening   Total Gym Leg Press  seat#8, recline #4; both legs 75# 30x ; right leg 40# 30x with VC to fully straigthen knees; 55# 15x      Knee/Hip Exercises: Standing   Functional Squat  1 set;10 reps;5 seconds    Functional Squat Limitations  holding 5#, working on weight on right leg and correct posture    Other Standing Knee Exercises  dead lift 5# in each hand wiht VC to keep back extended and work the back and hamstrings; back row with 5# in each hand      Knee/Hip Exercises: Prone   Other Prone Exercises  quadruped on ball and hip extension then abduction to work the gluteus medius 10x               PT Short Term Goals - 11/13/19 1417      PT [place] #1   Title  independent with initial HEP    Status  Achieved        PT Long Term Goals - 01/28/20 [id number]      PT LONG TERM GOAL #1   Title  decrease pain >/= 60% in bilateral hips and sacrum due to decreased gait deficits    Baseline  decreased by  50%    Time  8    Period  Weeks    Status  On-going      PT LONG TERM GOAL #2   Title  walk with minimal gait deficits for for 30 minutes in a store with pain decreased >/= 60%    Time  8    Period   Weeks    Status  On-going      PT LONG TERM GOAL #3   Title  understand posture and correct  body mechanics with daily tasks to decreased strain on back and hips    Time  8    Period  Weeks    Status  Achieved      PT LONG TERM GOAL #4   Title  return to  advance HEP with minimal pain to furher increase strength    Time  8    Period  Weeks    Status  On-going            Plan - 01/28/20 0858    Clinical Impression Statement  Patietn needs tactile cues to work the gluteus medius in qudruped. Patient still has tightness in the right gluteus and ITB. Patient reports no more right groin pain just the posterior right hip and ITB area. Patient went up to 75# from 50# on the leg press. Patient reports it is 25% easier to put her shoes on right foot. Patient will benefit from skilled therapy to elongate the muscles in back and hip, increased strength and improve right hip ROM.    Stability/Clinical Decision Making  Stable/Uncomplicated    Rehab Potential  Good    PT Frequency  2x / week    PT Duration  8 weeks    PT Treatment/Interventions  ADLs/Self Care Home Management;Cryotherapy;Electrical Stimulation;Iontophoresis 4mg /ml Dexamethasone;Moist Heat;Ultrasound;Gait training;Therapeutic activities;Therapeutic exercise;Balance training;Neuromuscular re-education;Manual techniques;Patient/family education;Dry needling    PT Next Visit Plan  ionto to right hip; gluteus medius strenght, work on back, continue with addaday    PT Home Exercise Plan  Access Code: [email or web address]    Consulted and Agree with Plan of Care  Patient       Patient will benefit from skilled therapeutic intervention in order to improve the following deficits and impairments:  Abnormal gait, Pain, Increased muscle spasms, Decreased mobility, Decreased activity tolerance, Decreased range of motion, Decreased strength, Impaired flexibility, Difficulty walking  Visit Diagnosis: Pain in right hip  Difficulty in walking, not  elsewhere classified  Pain in left hip  Acute midline low back pain without sciatica     Problem List Patient Active Problem List   Diagnosis Date Noted  . Vaginal burning 06/22/2019  . Anxiety 06/19/2018  . Ductal carcinoma in situ (DCIS) of right breast 07/08/2017  . Stress 04/09/2017  . Health care maintenance 06/14/2015  . Numbness of foot 06/09/2014  . Neck nodule 12/29/2013  . 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 Simple partial seizures ([place]) 09/11/2013  . Encounter for therapeutic drug monitoring 09/11/2013  . Hypokalemia 06/24/2013  . Essential hypertension, benign 01/11/2013  . Hypercholesterolemia 01/11/2013  . Seizure disorder ([place]) 01/11/2013  . Gastric ulcer 01/11/2013  . Hypothyroidism 01/11/2013  . Obstructive sleep apnea 01/11/2013    Earlie Counts, PT 01/28/20 9:26 AM   [facility] [address] [place], [place], [address] Phone: [phone]   Fax:  [phone]  Name: MARCEA BRIEGER MRN: [id number] Date of Birth: 1961-09-08

## 2020-01-31 ENCOUNTER — Ambulatory Visit: Payer: BC Managed Care – PPO | Admitting: Physical Therapy

## 2020-02-04 ENCOUNTER — Encounter: Payer: Self-pay | Admitting: Physical Therapy

## 2020-02-04 ENCOUNTER — Other Ambulatory Visit: Payer: Self-pay

## 2020-02-04 ENCOUNTER — Ambulatory Visit: Payer: BC Managed Care – PPO | Admitting: Physical Therapy

## 2020-02-04 DIAGNOSIS — M25551 Pain in right hip: Secondary | ICD-10-CM

## 2020-02-04 DIAGNOSIS — R262 Difficulty in walking, not elsewhere classified: Secondary | ICD-10-CM

## 2020-02-04 DIAGNOSIS — M545 Low back pain, unspecified: Secondary | ICD-10-CM

## 2020-02-04 DIAGNOSIS — M25552 Pain in left hip: Secondary | ICD-10-CM

## 2020-02-04 NOTE — Therapy (Signed)
[facility] [address], [place], [place], [address] Phone: [phone]   Fax:  [phone]  Physical Therapy Treatment  Patient Details  Name: Jennifer Pratt MRN: [id number] Date of Birth: 1961-01-01 Referring Provider (PT): Normajean Glasgow   Encounter Date: 02/04/2020  PT End of Session - 02/04/20 0909    Visit Number  19    Date for PT Re-Evaluation  03/03/20    Authorization Type  BCBS    PT Start Time  0845    PT Stop Time  0925    PT Time Calculation (min)  40 min    Activity Tolerance  Patient tolerated treatment well;No increased pain    Behavior During Therapy  WFL for tasks assessed/performed       Past Medical History:  Diagnosis Date  . Arthritis   . 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  . Hypercholesterolemia   . Hypertension   . Hypothyroidism   . 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 Sleep apnea    USES CPAP  . Ulcer    Gastric    Past Surgical History:  Procedure Laterality Date  . BREAST BIOPSY Right 06/28/2017   Stereo affirm- UOQ/DUCTAL CARCINOMA IN SITU, HIGH NUCLEAR GRADE WITH COMEDONECROSIS   . BREAST EXCISIONAL BIOPSY Right 07/17/2017   WIde excision upper outer quadrant DCIS  Dr. Bary Castilla  . BREAST LUMPECTOMY Right 07/17/2017   DCIS mammosite  . CESAREAN SECTION    . COLONOSCOPY N/A 10/05/2016   Procedure: COLONOSCOPY;  Surgeon: Manya Silvas, MD;  Location: [facility] ENDOSCOPY;  Service: Endoscopy;  Laterality: N/A;  Zosyn IVPB  . JOINT REPLACEMENT    . MASTECTOMY, PARTIAL Right 07/17/2017   Procedure: MASTECTOMY PARTIAL;  Surgeon: Robert Bellow, MD;  Location: [facility] ORS;  Service: General;  Laterality: Right;  . thumb surgery Right 2005   AND CTR  . THYROIDECTOMY  1988  . TOTAL KNEE ARTHROPLASTY Left 2012    There were no vitals filed for this visit.  Subjective Assessment - 02/04/20 0850    Subjective   Everything is feeling fine except for my original reason for the pain in the right hip and lateral thigh. When I walk much I feel like the right leg will give out and hurts.    Pertinent History  left TKA    Limitations  Lifting;Standing;Walking    How long can you stand comfortably?  difficulty > 10 minutes    How long can you walk comfortably?  had to use a cane earlier in the summer    Patient Stated Goals  have less pain, walk better    Currently in Pain?  Yes    Pain Score  4     Pain Location  Hip    Pain Orientation  Right    Pain Descriptors / Indicators  Dull    Pain Type  Acute pain    Pain Radiating Towards  radiates down into the right lateral thigh and lateral lower leg.    Pain Onset  More than a month ago    Pain Frequency  Intermittent    Aggravating Factors   walking and standing    Pain Relieving Factors  exercise and stretching    Effect of Pain on Daily Activities  limits    Multiple Pain Sites  No         [facility] PT Assessment -  02/04/20 0001      PROM   Right Hip Flexion  115                Pelvic Floor Special Questions - 02/04/20 0001    Pelvic Floor Internal Exam  Patient confirms identification and approves PT to assess pelvic floor and treatment    Exam Type  Vaginal    Palpation  tightness in the right pelvic floor muscles    Strength  fair squeeze, definite lift   at first need VC to not contract gluteals   Strength # of seconds  10        [facility] Adult PT Treatment/Exercise - 02/04/20 0001      Self-Care   Self-Care  Other Self-Care Comments    Other Self-Care Comments   education on vaginal moisturizers and how they reduce vaginal dryness      Knee/Hip Exercises: Aerobic   Elliptical  level 2 for 5 minutes      Knee/Hip Exercises: Standing   Other Standing Knee Exercises  squat with hip hinging to fire the glutes      Manual Therapy   Manual Therapy  Internal Pelvic Floor;Joint mobilization;Soft tissue mobilization    Joint  Mobilization  posterior glide of the right hip, distraction of the right hip in IR and adduction, gapping of the right Lumbar facets in sidely    Soft tissue mobilization  right gluteus, quadratus, lumbar paraspinals, hip rotators, along the right SI joint    Internal Pelvic Floor  to the right obturator internist, levator ani             PT Education - 02/04/20 0934    Education Details  information on vaginal moisturizers and pelvic floor contraction    Person(s) Educated  Patient    Methods  Explanation;Handout    Comprehension  Verbalized understanding       PT Short Term Goals - 11/13/19 1417      PT SHORT TERM GOAL #1   Title  independent with initial HEP    Status  Achieved        PT Long Term Goals - 01/28/20 0924      PT LONG TERM GOAL #1   Title  decrease pain >/= 60% in bilateral hips and sacrum due to decreased gait deficits    Baseline  decreased by 50%    Time  8    Period  Weeks    Status  On-going      PT LONG TERM GOAL #2   Title  walk with minimal gait deficits for for 30 minutes in a store with pain decreased >/= 60%    Time  8    Period  Weeks    Status  On-going      PT LONG TERM GOAL #3   Title  understand posture and correct  body mechanics with daily tasks to decreased strain on back and hips    Time  8    Period  Weeks    Status  Achieved      PT LONG TERM GOAL #4   Title  return to  advance HEP with minimal pain to furher increase strength    Time  8    Period  Weeks    Status  On-going            Plan - 02/04/20 1014    Clinical Impression Statement  Patient is walking better and better function but she is having  pain in right buttocks leading into the right lower leg. Patient is dilegent with her exercises. After manual work right hip flexion increased to 115 degrees. Patient has tightness in the right hip and right lumbar facets. Patient was being educated on hip hinging and how to engage her gluteals today. Patient will  benefit from skilled therap yto elongate teh muscles in back and hip, increased strength and improve right hip ROM.    Stability/Clinical Decision Making  Stable/Uncomplicated    Rehab Potential  Good    PT Frequency  2x / week    PT Duration  8 weeks    PT Treatment/Interventions  ADLs/Self Care Home Management;Cryotherapy;Electrical Stimulation;Iontophoresis 4mg /ml Dexamethasone;Moist Heat;Ultrasound;Gait training;Therapeutic activities;Therapeutic exercise;Balance training;Neuromuscular re-education;Manual techniques;Patient/family education;Dry needling    PT Next Visit Plan  work on tissue, hip joint mobilizations, elongate right hip, engage the gluteals    PT Home Exercise Plan  Access Code: [email or web address]    Consulted and Agree with Plan of Care  Patient       Patient will benefit from skilled therapeutic intervention in order to improve the following deficits and impairments:  Abnormal gait, Pain, Increased muscle spasms, Decreased mobility, Decreased activity tolerance, Decreased range of motion, Decreased strength, Impaired flexibility, Difficulty walking  Visit Diagnosis: Pain in right hip  Difficulty in walking, not elsewhere classified  Pain in left hip  Acute midline low back pain without sciatica     Problem List Patient Active Problem List   Diagnosis Date Noted  . Vaginal burning 06/22/2019  . Anxiety 06/19/2018  . Ductal carcinoma in situ (DCIS) of right breast 07/08/2017  . Stress 04/09/2017  . Health care maintenance 06/14/2015  . Numbness of foot 06/09/2014  . Neck nodule 12/29/2013  . 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 Simple partial seizures ([place]) 09/11/2013  . Encounter for therapeutic drug monitoring 09/11/2013  . Hypokalemia 06/24/2013  . Essential hypertension, benign 01/11/2013  . Hypercholesterolemia 01/11/2013  . Seizure disorder ([place]) 01/11/2013  . Gastric ulcer 01/11/2013  . Hypothyroidism 01/11/2013  . Obstructive sleep apnea  01/11/2013    Earlie Counts, PT 02/04/20 10:18 AM   [facility] [address] [place], [place], [address] Phone: [phone]   Fax:  [phone]  Name: Jennifer Pratt MRN: [id number] Date of Birth: January 18, 1961

## 2020-02-04 NOTE — Patient Instructions (Addendum)
Moisturizers . They are used in the vagina to hydrate the mucous membrane that make up the vaginal canal. . Designed to keep a more normal acid balance (ph) . Once placed in the vagina, it will last between two to three days.  . Use 2-3 times per week at bedtime  . Ingredients to avoid is glycerin and fragrance, can increase chance of infection . Should not be used just before sex due to causing irritation . Most are gels administered either in a tampon-shaped applicator or as a vaginal suppository. They are non-hormonal.   Types of Moisturizers  . Vitamin E vaginal suppositories- Whole foods, Amazon . Moist Again . Coconut oil- can break down condoms . Julva- (Do no use if on Tamoxifen) amazon . Yes moisturizer- amazon . NeuEve Silk , NeuEve Silver for menopausal or over 65 (if have severe vaginal atrophy or cancer treatments use NeuEve Silk for  1 month than move to [employer])- [employer], MapleFlower.dk . Olive and Bee intimate cream- www.oliveandbee.com.au . Mae vaginal [place] . Aloe .    Creams to use externally on the Vulva area  Albertson's (good for for cancer patients that had radiation to the area)- [place] or [employer].FlyingBasics.com.br  V-magic cream - amazon  Julva-amazon  Vital "V Wild Yam salve ( help moisturize and help with thinning vulvar area, does have [place] by Irwin Brakeman labial moisturizer ([place],   Coconut or olive oil  aloe   Things to avoid in the vaginal area . Do not use things to irritate the vulvar area . No lotions just specialized creams for the vulva area- Neogyn, V-magic, No soaps; can use Aveeno or Calendula cleanser if needed. Must be gentle . No deodorants . No douches . Good to sleep without underwear to let the vaginal area to air out . No scrubbing: spread the lips to let warm water rinse over labias and pat dry  Slow Contraction:  Gravity Resisted (Sitting)    Sitting, slowly squeeze pelvic floor for __10_ seconds. Rest for _10__ seconds. Repeat _5__ times. Do __3_ times a day.  Copyright  VHI. All rights reserved.  [facility] [address], [place],  [address] Phone # [phone] Fax [phone]

## 2020-02-06 ENCOUNTER — Encounter: Payer: Self-pay | Admitting: Physical Therapy

## 2020-02-06 ENCOUNTER — Other Ambulatory Visit: Payer: Self-pay

## 2020-02-06 ENCOUNTER — Ambulatory Visit: Payer: BC Managed Care – PPO | Admitting: Physical Therapy

## 2020-02-06 DIAGNOSIS — M545 Low back pain, unspecified: Secondary | ICD-10-CM

## 2020-02-06 DIAGNOSIS — M25552 Pain in left hip: Secondary | ICD-10-CM | POA: Diagnosis not present

## 2020-02-06 DIAGNOSIS — R262 Difficulty in walking, not elsewhere classified: Secondary | ICD-10-CM

## 2020-02-06 DIAGNOSIS — M25551 Pain in right hip: Secondary | ICD-10-CM

## 2020-02-06 NOTE — Therapy (Signed)
[facility] [address], [place], [place], [address] Phone: [phone]   Fax:  [phone]  Physical Therapy Treatment  Patient Details  Name: Jennifer Pratt MRN: [id number] Date of Birth: [birth year] Referring Provider (PT): Normajean Glasgow   Encounter Date: 02/06/2020  PT End of Session - 02/06/20 0929    Visit Number  20    Date for PT Re-Evaluation  03/03/20    Authorization Type  BCBS    PT Start Time  0845    PT Stop Time  0925    PT Time Calculation (min)  40 min    Activity Tolerance  Patient tolerated treatment well;No increased pain    Behavior During Therapy  WFL for tasks assessed/performed       Past Medical History:  Diagnosis Date  . Arthritis   . 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  . Hypercholesterolemia   . Hypertension   . Hypothyroidism   . 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 Sleep apnea    USES CPAP  . Ulcer    Gastric    Past Surgical History:  Procedure Laterality Date  . BREAST BIOPSY Right 06/28/2017   Stereo affirm- UOQ/DUCTAL CARCINOMA IN SITU, HIGH NUCLEAR GRADE WITH COMEDONECROSIS   . BREAST EXCISIONAL BIOPSY Right 07/17/2017   WIde excision upper outer quadrant DCIS  Dr. Bary Castilla  . BREAST LUMPECTOMY Right 07/17/2017   DCIS mammosite  . CESAREAN SECTION    . COLONOSCOPY N/A 10/05/2016   Procedure: COLONOSCOPY;  Surgeon: Manya Silvas, MD;  Location: [facility] ENDOSCOPY;  Service: Endoscopy;  Laterality: N/A;  Zosyn IVPB  . JOINT REPLACEMENT    . MASTECTOMY, PARTIAL Right 07/17/2017   Procedure: MASTECTOMY PARTIAL;  Surgeon: Robert Bellow, MD;  Location: [facility] ORS;  Service: General;  Laterality: Right;  . thumb surgery Right 2005   AND CTR  . THYROIDECTOMY  1988  . TOTAL KNEE ARTHROPLASTY Left 2012    There were no vitals filed for this visit.  Subjective Assessment - 02/06/20 0851    Subjective   I feel better after last visit. I use a pain patch on the lower leg. Both feet and toes are numb.    Pertinent History  left TKA    Limitations  Lifting;Standing;Walking    How long can you stand comfortably?  difficulty > 10 minutes    How long can you walk comfortably?  had to use a cane earlier in the summer    Patient Stated Goals  have less pain, walk better    Currently in Pain?  Yes    Pain Score  4     Pain Location  Hip    Pain Orientation  Right    Pain Descriptors / Indicators  Dull    Pain Type  Acute pain    Pain Onset  More than a month ago    Pain Frequency  Intermittent    Aggravating Factors   walking and standing    Pain Relieving Factors  exercise and standing    Multiple Pain Sites  No                       [facility] Adult PT Treatment/Exercise - 02/06/20 0001      Ambulation/Gait   Gait Comments  instruction on walking with hip to the right and lifting her chest  Lumbar Exercises: Standing   Other Standing Lumbar Exercises  standing lateral shif correction to the right with a towel and pulling hips to the right      Knee/Hip Exercises: Stretches   Hip Flexor Stretch  Right;2 reps;60 seconds;Left    Hip Flexor Stretch Limitations  with left foot on higher step then reverse, Therapist helping keeping the knee extended    Gastroc Stretch  Both;5 reps;30 seconds    Gastroc Stretch Limitations  on step      Knee/Hip Exercises: Aerobic   Elliptical  level 2 for 5 minutes      Manual Therapy   Manual Therapy  Soft tissue mobilization;Joint mobilization    Joint Mobilization  distraction of right femur in IR and adduction    Soft tissue mobilization  right gluteus, quadratus, lumbar paraspinals, hip rotators, along the right SI joint, and hi adductors       Trigger Point Dry Needling - 02/06/20 0001    Consent Given?  Yes    Education Handout Provided  Previously provided    Muscles Treated Lower Quadrant  Adductor longus/brevis/magnus    right   Muscles Treated Back/Hip  Gluteus medius;Gluteus minimus;Tensor fascia lata;Lumbar multifidi;Iliacus;Iliopsoas   rigjt   Adductor Response  Twitch response elicited;Palpable increased muscle length    Gluteus Minimus Response  Twitch response elicited;Palpable increased muscle length    Gluteus Medius Response  Palpable increased muscle length;Twitch response elicited    Tensor Fascia Lata Response  Twitch response elicited;Palpable increased muscle length    Lumbar multifidi Response  Twitch response elicited;Palpable increased muscle length    Iliopsoas Response  Twitch response elicited;Palpable increased muscle length    Quadratus Lumborum Response  Twitch response elicited;Palpable increased muscle length           PT Education - 02/06/20 0929    Education Details  walking with his hips shifted to the right and lifting her chest    Person(s) Educated  Patient    Methods  Explanation;Demonstration    Comprehension  Verbalized understanding;Returned demonstration       PT Short Term Goals - 11/13/19 1417      PT SHORT TERM GOAL #1   Title  independent with initial HEP    Status  Achieved        PT Long Term Goals - 02/06/20 0932      PT LONG TERM GOAL #1   Title  decrease pain >/= 60% in bilateral hips and sacrum due to decreased gait deficits    Baseline  decreased by 50%    Time  8    Period  Weeks    Status  On-going      PT LONG TERM GOAL #2   Title  walk with minimal gait deficits for for 30 minutes in a store with pain decreased >/= 60%    Time  8    Period  Weeks    Status  On-going      PT LONG TERM GOAL #3   Title  understand posture and correct  body mechanics with daily tasks to decreased strain on back and hips    Time  8    Period  Weeks    Status  Achieved      PT LONG TERM GOAL #4   Title  return to  advance HEP with minimal pain to furher increase strength    Time  8    Period  Weeks    Status  On-going  Plan -  02/06/20 0930    Clinical Impression Statement  Patient has tightness in the right hip adductors, TFL, Quadratus and iliopsoas. Patient walks with her hips to the left. Patient had less pain after manual work and walks with increased freeness of the muslces. Patient will benefit from skilled therapy to elongate the muscles in back and right hip , increased strength and improve right hip ROM.    Stability/Clinical Decision Making  Stable/Uncomplicated    Rehab Potential  Good    PT Frequency  2x / week    PT Duration  8 weeks    PT Treatment/Interventions  ADLs/Self Care Home Management;Cryotherapy;Electrical Stimulation;Iontophoresis 4mg /ml Dexamethasone;Moist Heat;Ultrasound;Gait training;Therapeutic activities;Therapeutic exercise;Balance training;Neuromuscular re-education;Manual techniques;Patient/family education;Dry needling    PT Next Visit Plan  work on tissue, hip joint mobilizations, elongate right hip, engage the gluteals; stretch the hip adductors, hip hinge with hips IR, use the suction cup on right side with trunk sidebending    PT Home Exercise Plan  Access Code: [email or web address]    Consulted and Agree with Plan of Care  Patient       Patient will benefit from skilled therapeutic intervention in order to improve the following deficits and impairments:  Abnormal gait, Pain, Increased muscle spasms, Decreased mobility, Decreased activity tolerance, Decreased range of motion, Decreased strength, Impaired flexibility, Difficulty walking  Visit Diagnosis: Pain in right hip  Difficulty in walking, not elsewhere classified  Acute midline low back pain without sciatica     Problem List Patient Active Problem List   Diagnosis Date Noted  . Vaginal burning 06/22/2019  . Anxiety 06/19/2018  . Ductal carcinoma in situ (DCIS) of right breast 07/08/2017  . Stress 04/09/2017  . Health care maintenance 06/14/2015  . Numbness of foot 06/09/2014  . Neck nodule 12/29/2013  . Generalized  nonconvulsive epilepsy ([place]) 09/11/2013  . Simple partial seizures ([place]) 09/11/2013  . Encounter for therapeutic drug monitoring 09/11/2013  . Hypokalemia 06/24/2013  . Essential hypertension, benign 01/11/2013  . Hypercholesterolemia 01/11/2013  . Seizure disorder ([place]) 01/11/2013  . Gastric ulcer 01/11/2013  . Hypothyroidism 01/11/2013  . Obstructive sleep apnea 01/11/2013    Earlie Counts, PT 02/06/20 9:34 AM   [facility] [address] [place], [place], [address] Phone: [phone]   Fax:  [phone]  Name: Jennifer Pratt MRN: [id number] Date of Birth: [birth year]

## 2020-02-11 ENCOUNTER — Other Ambulatory Visit: Payer: Self-pay

## 2020-02-11 ENCOUNTER — Ambulatory Visit: Payer: BC Managed Care – PPO | Admitting: Physical Therapy

## 2020-02-11 ENCOUNTER — Encounter: Payer: Self-pay | Admitting: Physical Therapy

## 2020-02-11 DIAGNOSIS — M25552 Pain in left hip: Secondary | ICD-10-CM

## 2020-02-11 DIAGNOSIS — R262 Difficulty in walking, not elsewhere classified: Secondary | ICD-10-CM

## 2020-02-11 DIAGNOSIS — M545 Low back pain, unspecified: Secondary | ICD-10-CM

## 2020-02-11 DIAGNOSIS — M25551 Pain in right hip: Secondary | ICD-10-CM | POA: Diagnosis not present

## 2020-02-11 NOTE — Therapy (Signed)
[facility] [address] [place], [place], [address] Phone: [phone]   Fax:  [phone]  Physical Therapy Treatment  Patient Details  Name: Jennifer Pratt MRN: [id number] Date of Birth: May 28, 1961 Referring Provider (PT): Normajean Glasgow   Encounter Date: 02/11/2020  PT End of Session - 02/11/20 0848    Visit Number  21    Date for PT Re-Evaluation  03/03/20    Authorization Type  BCBS    PT Start Time  0845    PT Stop Time  0925    PT Time Calculation (min)  40 min    Activity Tolerance  Patient tolerated treatment well;No increased pain    Behavior During Therapy  WFL for tasks assessed/performed       Past Medical History:  Diagnosis Date  . Arthritis   . 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  . Hypercholesterolemia   . Hypertension   . Hypothyroidism   . 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 Sleep apnea    USES CPAP  . Ulcer    Gastric    Past Surgical History:  Procedure Laterality Date  . BREAST BIOPSY Right 06/28/2017   Stereo affirm- UOQ/DUCTAL CARCINOMA IN SITU, HIGH NUCLEAR GRADE WITH COMEDONECROSIS   . BREAST EXCISIONAL BIOPSY Right 07/17/2017   WIde excision upper outer quadrant DCIS  Dr. Bary Castilla  . BREAST LUMPECTOMY Right 07/17/2017   DCIS mammosite  . CESAREAN SECTION    . COLONOSCOPY N/A 10/05/2016   Procedure: COLONOSCOPY;  Surgeon: Manya Silvas, MD;  Location[facility] END[facility]COPY;  Service: Endoscopy;  Laterality: N/A;  Zosyn IVPB  . JOINT REPLACEMENT    . MASTECTOMY, PARTIAL Right 07/17/2017   Procedure: MASTECTOMY PARTIAL;  Surgeon: Robert Bellow, MD;  Location: [facility] ORS;  Service: General;  Laterality: Right;  . thumb surgery Right 2005   AND CTR  . THYROIDECTOMY  1988  . TOTAL KNEE ARTHROPLASTY Left 2012    There were no vitals filed for this visit.  Subjective Assessment - 02/11/20 0847    Subjective   The right hip has very little pain and I have pain in the right lower leg. I see the chiropractor this afternoon.    Pertinent History  left TKA    Limitations  Lifting;Standing;Walking    How long can you stand comfortably?  difficulty > 10 minutes    How long can you walk comfortably?  had to use a cane earlier in the summer    Patient Stated Goals  have less pain, walk better    Currently in Pain?  Yes    Pain Score  3     Pain Location  Hip    Pain Orientation  Right    Pain Descriptors / Indicators  Dull    Pain Type  Acute pain    Pain Onset  More than a month ago    Pain Frequency  Intermittent    Aggravating Factors   walking and standing    Pain Relieving Factors  exercise and standing    Multiple Pain Sites  No                       [facility] Adult PT Treatment/Exercise - 02/11/20 0001      Knee/Hip Exercises: Aerobic   Elliptical  level 2 for 5 minutes      Knee/Hip  Exercises: Sidelying   Other Sidelying Knee/Hip Exercises  modified side plank on the right 10x    Other Sidelying Knee/Hip Exercises  sidely left on physio ball and leaning to the left with breath then rock chest back and forth      Manual Therapy   Manual Therapy  Soft tissue mobilization;Myofascial release;Joint mobilization    Joint Mobilization  mobilized the right fibula head.     Soft tissue mobilization  to right quads, right ITB, right lateral hamstring, right peroneals, right Anterior tibialis, and behind the right fibula head    Myofascial Release  used the suction cup to the lateral  right thigh and lower leg       Trigger Point Dry Needling - 02/11/20 0001    Consent Given?  Yes    Education Handout Provided  Previously provided    Muscles Treated Lower Quadrant  Hamstring;Peroneals;Vastus lateralis   right   Vastus lateralis Response  Twitch response elicited;Palpable increased muscle length    Peroneals Response  Twitch response elicited;Palpable increased muscle length     Hamstring Response  Twitch response elicited;Palpable increased muscle length    Tensor Fascia Lata Response  Twitch response elicited;Palpable increased muscle length             PT Short Term Goals - 11/13/19 1417      PT SHORT TERM GOAL #1   Title  independent with initial HEP    Status  Achieved        PT Long Term Goals - 02/11/20 0849      PT LONG TERM GOAL #1   Title  decrease pain >/= 60% in bilateral hips and sacrum due to decreased gait deficits    Baseline  decreased by 50%    Time  8    Period  Weeks    Status  On-going      PT LONG TERM GOAL #2   Title  walk with minimal gait deficits for for 30 minutes in a store with pain decreased >/= 60%    Time  8    Period  Weeks    Status  On-going      PT LONG TERM GOAL #3   Title  understand posture and correct  body mechanics with daily tasks to decreased strain on back and hips    Time  8    Period  Weeks    Status  Achieved      PT LONG TERM GOAL #4   Title  return to  advance HEP with minimal pain to furher increase strength    Time  8    Period  Weeks    Status  On-going            Plan - 02/11/20 0849    Clinical Impression Statement  Patient right hip pain is better but she still has pain in the right peroneals. Patient walks with a lateral shift to the right but has better hip adduction with walking. Patient has fascial tightness in the right thoracic, lumbar, pelvic and right lateral leg. Patient is working hard at home. Patient has a recumbent bike she is using. Patient will benefit from skilled therapy to improve gait deficits and reduce pain.    Stability/Clinical Decision Making  Stable/Uncomplicated    Rehab Potential  Good    PT Frequency  2x / week    PT Duration  8 weeks    PT Treatment/Interventions  ADLs/Self Care Home Management;Cryotherapy;Electrical Stimulation;Iontophoresis 4mg /ml  Dexamethasone;Moist Heat;Ultrasound;Gait training;Therapeutic activities;Therapeutic  exercise;Balance training;Neuromuscular re-education;Manual techniques;Patient/family education;Dry needling    PT Next Visit Plan  take hip measurements, side planks, rib cage expansion, work on peroneals and lateral thigh    PT Home Exercise Plan  Access Code: [email or web address]    Consulted and Agree with Plan of Care  Patient       Patient will benefit from skilled therapeutic intervention in order to improve the following deficits and impairments:  Abnormal gait, Pain, Increased muscle spasms, Decreased mobility, Decreased activity tolerance, Decreased range of motion, Decreased strength, Impaired flexibility, Difficulty walking  Visit Diagnosis: Pain in right hip  Difficulty in walking, not elsewhere classified  Acute midline low back pain without sciatica  Pain in left hip     Problem List Patient Active Problem List   Diagnosis Date Noted  . Vaginal burning 06/22/2019  . Anxiety 06/19/2018  . Ductal carcinoma in situ (DCIS) of right breast 07/08/2017  . Stress 04/09/2017  . Health care maintenance 06/14/2015  . Numbness of foot 06/09/2014  . Neck nodule 12/29/2013  . 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 Simple partial seizures ([place]) 09/11/2013  . Encounter for therapeutic drug monitoring 09/11/2013  . Hypokalemia 06/24/2013  . Essential hypertension, benign 01/11/2013  . Hypercholesterolemia 01/11/2013  . Seizure disorder ([place]) 01/11/2013  . Gastric ulcer 01/11/2013  . Hypothyroidism 01/11/2013  . Obstructive sleep apnea 01/11/2013    Earlie Counts, PT 02/11/20 9:33 AM   [facility] [address], [place], [place], [address] Phone: [phone]   Fax:  [phone]  Name: AAYRA TRACH MRN: [id number] Date of Birth: July 20, 1961

## 2020-02-14 ENCOUNTER — Encounter: Payer: Self-pay | Admitting: Physical Therapy

## 2020-02-14 ENCOUNTER — Other Ambulatory Visit: Payer: Self-pay

## 2020-02-14 ENCOUNTER — Ambulatory Visit: Payer: BC Managed Care – PPO | Admitting: Physical Therapy

## 2020-02-14 DIAGNOSIS — M545 Low back pain, unspecified: Secondary | ICD-10-CM

## 2020-02-14 DIAGNOSIS — R262 Difficulty in walking, not elsewhere classified: Secondary | ICD-10-CM

## 2020-02-14 DIAGNOSIS — M25551 Pain in right hip: Secondary | ICD-10-CM

## 2020-02-14 DIAGNOSIS — M25552 Pain in left hip: Secondary | ICD-10-CM | POA: Diagnosis not present

## 2020-02-14 NOTE — Therapy (Signed)
[facility] [address] [place], [place], [address] Phone: [phone]   Fax:  [phone]  Physical Therapy Treatment  Patient Details  Name: Jennifer Pratt MRN: [id number] Date of Birth: 1961/07/08 Referring Provider (PT): Normajean Glasgow   Encounter Date: 02/14/2020  PT End of Session - 02/14/20 0931    Visit Number  22    Date for PT Re-Evaluation  03/03/20    Authorization Type  BCBS    PT Start Time  0845    PT Stop Time  0925    PT Time Calculation (min)  40 min    Activity Tolerance  Patient tolerated treatment well;No increased pain    Behavior During Therapy  WFL for tasks assessed/performed       Past Medical History:  Diagnosis Date  . Arthritis   . 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  . Hypercholesterolemia   . Hypertension   . Hypothyroidism   . 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 Sleep apnea    USES CPAP  . Ulcer    Gastric    Past Surgical History:  Procedure Laterality Date  . BREAST BIOPSY Right 06/28/2017   Stereo affirm- UOQ/DUCTAL CARCINOMA IN SITU, HIGH NUCLEAR GRADE WITH COMEDONECROSIS   . BREAST EXCISIONAL BIOPSY Right 07/17/2017   WIde excision upper outer quadrant DCIS  Dr. Bary Castilla  . BREAST LUMPECTOMY Right 07/17/2017   DCIS mammosite  . CESAREAN SECTION    . COLONOSCOPY N/A 10/05/2016   Procedure: COLONOSCOPY;  Surgeon: Manya Silvas, MD;  Location: [facility] ENDOSCOPY;  Service: Endoscopy;  Laterality: N/A;  Zosyn IVPB  . JOINT REPLACEMENT    . MASTECTOMY, PARTIAL Right 07/17/2017   Procedure: MASTECTOMY PARTIAL;  Surgeon: Robert Bellow, MD;  Location: [facility] ORS;  Service: General;  Laterality: Right;  . thumb surgery Right 2005   AND CTR  . THYROIDECTOMY  1988  . TOTAL KNEE ARTHROPLASTY Left 2012    There were no vitals filed for this visit.  Subjective Assessment - 02/14/20 0852    Subjective   The last session helped my lower leg. I hit my right knee since last week and now my right hip is bothering me alot now.    Pertinent History  left TKA    Limitations  Lifting;Standing;Walking    How long can you stand comfortably?  difficulty > 10 minutes    How long can you walk comfortably?  had to use a cane earlier in the summer    Patient Stated Goals  have less pain, walk better    Currently in Pain?  Yes    Pain Score  6     Pain Location  Hip    Pain Orientation  Right    Pain Descriptors / Indicators  Dull   pinched nerve   Pain Type  Chronic pain    Pain Onset  More than a month ago    Pain Frequency  Intermittent    Aggravating Factors   walking and standing    Pain Relieving Factors  exercise and standing    Multiple Pain Sites  No         [facility] PT Assessment - 02/14/20 0001      PROM   Right Hip Flexion  115   started at 90 degrees, after manual work was 115  [place] Adult PT Treatment/Exercise - 02/14/20 0001      Manual Therapy   Manual Therapy  Soft tissue mobilization;Joint mobilization;Myofascial release    Manual therapy comments  used addaday to the righ tgluteals, right lumbar, right ITB, right peroneals    Joint Mobilization  gapping of the right side of L1-S1 in left sidey; posterior right hip glide grade 3    Soft tissue mobilization  right lateral leg, right gluteal, iliopsoas, quads, quadratus, ITB, peroneals    Passive ROM  for righ thip flexion and IR, abduction and adduction               PT Short Term Goals - 11/13/19 1417      PT SHORT TERM GOAL #1   Title  independent with initial HEP    Status  Achieved        PT Long Term Goals - 02/11/20 0849      PT LONG TERM GOAL #1   Title  decrease pain >/= 60% in bilateral hips and sacrum due to decreased gait deficits    Baseline  decreased by 50%    Time  8    Period  Weeks    Status  On-going      PT LONG TERM GOAL #2   Title  walk with minimal  gait deficits for for 30 minutes in a store with pain decreased >/= 60%    Time  8    Period  Weeks    Status  On-going      PT LONG TERM GOAL #3   Title  understand posture and correct  body mechanics with daily tasks to decreased strain on back and hips    Time  8    Period  Weeks    Status  Achieved      PT LONG TERM GOAL #4   Title  return to  advance HEP with minimal pain to furher increase strength    Time  8    Period  Weeks    Status  On-going            Plan - 02/14/20 0931    Clinical Impression Statement  Patient did not have right peroneal pain since the  dry needling was done to the area. Patient hit her right knee an dfelt her right hip be jammed up. Patient was having increased pinching pain in right hip and groin in standing and weightbearing. Patient had tightness in the right hip adduction, abduction, flexion and IR. Patient had increased mobility and no pain after manual work. Patient is able to walk with lateral shift reduced by 75%. Patient was not able to do exercise or cardio today due to the right hip pain. Patient will benefit from skilled therapy to improve gait deficits and reduce pain.    Stability/Clinical Decision Making  Stable/Uncomplicated    Rehab Potential  Good    PT Frequency  2x / week    PT Duration  8 weeks    PT Treatment/Interventions  ADLs/Self Care Home Management;Cryotherapy;Electrical Stimulation;Iontophoresis 4mg /ml Dexamethasone;Moist Heat;Ultrasound;Gait training;Therapeutic activities;Therapeutic exercise;Balance training;Neuromuscular re-education;Manual techniques;Patient/family education;Dry needling    PT Next Visit Plan  take hip measurements, side planks, rib cage expansion, work on peroneals and lateral thigh    PT Home Exercise Plan  Access Code: [email or web address]    Consulted and Agree with Plan of Care  Patient       Patient will benefit from skilled therapeutic intervention in order to improve the following  deficits and  impairments:  Abnormal gait, Pain, Increased muscle spasms, Decreased mobility, Decreased activity tolerance, Decreased range of motion, Decreased strength, Impaired flexibility, Difficulty walking  Visit Diagnosis: Pain in right hip  Difficulty in walking, not elsewhere classified  Acute midline low back pain without sciatica  Pain in left hip     Problem List Patient Active Problem List   Diagnosis Date Noted  . Vaginal burning 06/22/2019  . Anxiety 06/19/2018  . Ductal carcinoma in situ (DCIS) of right breast 07/08/2017  . Stress 04/09/2017  . Health care maintenance 06/14/2015  . Numbness of foot 06/09/2014  . Neck nodule 12/29/2013  . 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 Simple partial seizures ([place]) 09/11/2013  . Encounter for therapeutic drug monitoring 09/11/2013  . Hypokalemia 06/24/2013  . Essential hypertension, benign 01/11/2013  . Hypercholesterolemia 01/11/2013  . Seizure disorder ([place]) 01/11/2013  . Gastric ulcer 01/11/2013  . Hypothyroidism 01/11/2013  . Obstructive sleep apnea 01/11/2013    Earlie Counts, PT 02/14/20 9:37 AM   [facility] [address], [place], [place], [address] Phone: [phone]   Fax:  [phone]  Name: Jennifer Pratt MRN: [id number] Date of Birth: December 06, 1960

## 2020-02-18 ENCOUNTER — Encounter: Payer: Self-pay | Admitting: Physical Therapy

## 2020-02-18 ENCOUNTER — Ambulatory Visit: Payer: BC Managed Care – PPO | Admitting: Physical Therapy

## 2020-02-18 ENCOUNTER — Other Ambulatory Visit: Payer: Self-pay

## 2020-02-18 DIAGNOSIS — M25552 Pain in left hip: Secondary | ICD-10-CM | POA: Diagnosis not present

## 2020-02-18 DIAGNOSIS — M545 Low back pain, unspecified: Secondary | ICD-10-CM

## 2020-02-18 DIAGNOSIS — M25551 Pain in right hip: Secondary | ICD-10-CM

## 2020-02-18 DIAGNOSIS — R262 Difficulty in walking, not elsewhere classified: Secondary | ICD-10-CM | POA: Diagnosis not present

## 2020-02-18 NOTE — Therapy (Signed)
[facility] [address] [place], [place], [address] Phone: [phone]   Fax:  [phone]  Physical Therapy Treatment  Patient Details  Name: Jennifer Pratt MRN: [id number] Date of Birth: Mar 01, 1961 Referring Provider (PT): Normajean Glasgow   Encounter Date: 02/18/2020  PT End of Session - 02/18/20 0852    Visit Number  23    Date for PT Re-Evaluation  03/03/20    Authorization Type  BC[employer]    PT Start Time  0845    PT Stop Time  0925    PT Time Calculation (min)  40 min    Activity Tolerance  Patient tolerated treatment well;No increased pain    Behavior During Therapy  WFL for tasks assessed/performed       Past Medical History:  Diagnosis Date  . Arthritis   . 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  . Hypercholesterolemia   . Hypertension   . Hypothyroidism   . 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 Sleep apnea    USES CPAP  . Ulcer    Gastric    Past Surgical History:  Procedure Laterality Date  . BREAST BIOPSY Right 06/28/2017   Stereo affirm- UOQ/DUCTAL CARCINOMA IN SITU, HIGH NUCLEAR GRADE WITH COMEDONECROSIS   . BREAST EXCISIONAL BIOPSY Right 07/17/2017   WIde excision upper outer quadrant DCIS  Dr. Bary Castilla  . BREAST LUMPECTOMY Right 07/17/2017   DCIS mammosite  . CESAREAN SECTION    . COLONOSCOPY N/A 10/05/2016   Procedure: COLONOSCOPY;  Surgeon: Manya Silvas, MD;  Location: [facility] ENDOSCOPY;  Service: Endoscopy;  Laterality: N/A;  Zosyn IVPB  . JOINT REPLACEMENT    . MASTECTOMY, PARTIAL Right 07/17/2017   Procedure: MASTECTOMY PARTIAL;  Surgeon: Robert Bellow, MD;  Location: [facility] ORS;  Service: General;  Laterality: Right;  . thumb surgery Right 2005   AND CTR  . THYROIDECTOMY  1988  . TOTAL KNEE ARTHROPLASTY Left 2012    There were no vitals filed for this visit.  Subjective Assessment - 02/18/20 0850    Subjective   After the using the recumbent bike I have some left anterior hip discomfort. The lower right leg feels better.    Pertinent History  left TKA    Limitations  Lifting;Standing;Walking    How long can you stand comfortably?  difficulty > 10 minutes    How long can you walk comfortably?  had to use a cane earlier in the summer    Patient Stated Goals  have less pain, walk better    Currently in Pain?  Yes    Pain Score  4     Pain Location  Hip    Pain Orientation  Right    Pain Descriptors / Indicators  Dull    Pain Type  Chronic pain    Pain Onset  More than a month ago    Pain Frequency  Intermittent    Aggravating Factors   walking and standing    Pain Relieving Factors  exercise and standing    Multiple Pain Sites  No         [facility] PT Assessment - 02/18/20 0001      Assessment   Medical Diagnosis  right hip and left hip pain    Referring Provider (PT)  Normajean Glasgow    Onset Date/Surgical Date  10/07/19      PROM  Right Hip Extension  5    Right Hip Flexion  115   started at 90 degrees, after manual work was 115   Right Hip External Rotation   55    Right Hip Internal Rotation   15    Right Hip ABduction  20                   [facility] Adult PT Treatment/Exercise - 02/18/20 0001      Knee/Hip Exercises: Stretches   Hip Flexor Stretch  Right;2 reps;60 seconds;Left    Other Knee/Hip Stretches  stand hip adductor on the right stretch hold for 30 sec 2x      Knee/Hip Exercises: Aerobic   Elliptical  level 2 for 5 minutes (2.5 min forward and backward)      Knee/Hip Exercises: Sidelying   Other Sidelying Knee/Hip Exercises  lay on foam roll on the left with bringing the right leg into stretch for the ITB using the addaday to the right ITB and hip flexor      Knee/Hip Exercises: Prone   Hip Extension  Strengthening;AAROM;Right;1 set;20 reps    Hip Extension Limitations  with therapist cuing to not lift the right pelvis    Other Prone Exercises  prone contract relax  to increase hip ER      Manual Therapy   Manual Therapy  Soft tissue mobilization;Joint mobilization    Joint Mobilization  anterior glide of right hip grade 3 in prone; P-A and rotational mobilization to T6-L5 to work on extension     Soft tissue mobilization  right gluteal and lumbar paraspinals               PT Short Term Goals - 11/13/19 1417      PT SHORT TERM GOAL #1   Title  independent with initial HEP    Status  Achieved        PT Long Term Goals - 02/11/20 0849      PT LONG TERM GOAL #1   Title  decrease pain >/= 60% in bilateral hips and sacrum due to decreased gait deficits    Baseline  decreased by 50%    Time  8    Period  Weeks    Status  On-going      PT LONG TERM GOAL #2   Title  walk with minimal gait deficits for for 30 minutes in a store with pain decreased >/= 60%    Time  8    Period  Weeks    Status  On-going      PT LONG TERM GOAL #3   Title  understand posture and correct  body mechanics with daily tasks to decreased strain on back and hips    Time  8    Period  Weeks    Status  Achieved      PT LONG TERM GOAL #4   Title  return to  advance HEP with minimal pain to furher increase strength    Time  8    Period  Weeks    Status  On-going            Plan - 02/18/20 0925    Clinical Impression Statement  Patient continues to have tightness in the right hip. Patient continues to have difficulty with putting her right shoe on. Patient is working hard at home with her exercises an dusing the recubent bike. Patient has a tight ITB and right hip flexor .Patient has clicking  in her right hip as she extends it in prone. Patient is going to see if she is able to get an appointment to have her right hip further evaluated.Patient will benefit from skilled therapy to improve gait deficits and reduce pain.    Stability/Clinical Decision Making  Stable/Uncomplicated    Rehab Potential  Good    PT Frequency  2x / week    PT Duration  8 weeks     PT Treatment/Interventions  ADLs/Self Care Home Management;Cryotherapy;Electrical Stimulation;Iontophoresis 4mg /ml Dexamethasone;Moist Heat;Ultrasound;Gait training;Therapeutic activities;Therapeutic exercise;Balance training;Neuromuscular re-education;Manual techniques;Patient/family education;Dry needling    PT Next Visit Plan  see if patient was able to ge an appointment with her doctor, possible discharge due to plateuing in PT    PT Home Exercise Plan  Access Code: [email or web address]    Consulted and Agree with Plan of Care  Patient       Patient will benefit from skilled therapeutic intervention in order to improve the following deficits and impairments:  Abnormal gait, Pain, Increased muscle spasms, Decreased mobility, Decreased activity tolerance, Decreased range of motion, Decreased strength, Impaired flexibility, Difficulty walking  Visit Diagnosis: Pain in right hip  Difficulty in walking, not elsewhere classified  Acute midline low back pain without sciatica  Pain in left hip     Problem List Patient Active Problem List   Diagnosis Date Noted  . Vaginal burning 06/22/2019  . Anxiety 06/19/2018  . Ductal carcinoma in situ (DCIS) of right breast 07/08/2017  . Stress 04/09/2017  . Health care maintenance 06/14/2015  . Numbness of foot 06/09/2014  . Neck nodule 12/29/2013  . 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 Simple partial seizures ([place]) 09/11/2013  . Encounter for therapeutic drug monitoring 09/11/2013  . Hypokalemia 06/24/2013  . Essential hypertension, benign 01/11/2013  . Hypercholesterolemia 01/11/2013  . Seizure disorder ([place]) 01/11/2013  . Gastric ulcer 01/11/2013  . Hypothyroidism 01/11/2013  . Obstructive sleep apnea 01/11/2013    Jennifer Pratt, PT 02/18/20 9:32 AM   [facility] [address] [place], [place], [address] Phone: [phone]   Fax:  [phone]  Name: Jennifer Pratt MRN: [id number] Date of Birth: 1960/12/25

## 2020-02-20 ENCOUNTER — Ambulatory Visit: Payer: BC Managed Care – PPO | Admitting: Internal Medicine

## 2020-02-20 ENCOUNTER — Other Ambulatory Visit: Payer: Self-pay

## 2020-02-20 VITALS — BP 128/78 | HR 93 | Temp 95.6°F | Resp 16 | Ht 68.0 in | Wt 234.6 lb

## 2020-02-20 DIAGNOSIS — I10 Essential (primary) hypertension: Secondary | ICD-10-CM

## 2020-02-20 DIAGNOSIS — R739 Hyperglycemia, unspecified: Secondary | ICD-10-CM | POA: Diagnosis not present

## 2020-02-20 DIAGNOSIS — G4733 Obstructive sleep apnea (adult) (pediatric): Secondary | ICD-10-CM

## 2020-02-20 DIAGNOSIS — F419 Anxiety disorder, unspecified: Secondary | ICD-10-CM

## 2020-02-20 DIAGNOSIS — G40309 Generalized idiopathic epilepsy and epileptic syndromes, not intractable, without status epilepticus: Secondary | ICD-10-CM

## 2020-02-20 DIAGNOSIS — D0511 Intraductal carcinoma in situ of right breast: Secondary | ICD-10-CM | POA: Diagnosis not present

## 2020-02-20 DIAGNOSIS — E7800 Pure hypercholesterolemia, unspecified: Secondary | ICD-10-CM

## 2020-02-20 NOTE — Progress Notes (Signed)
Patient ID: Jennifer Pratt, female   DOB: [birth year], 59 y.o.   MRN: [id number]   Subjective:    Patient ID: Jennifer Pratt, female    DOB: 11-Mar-1961, 59 y.o.   MRN: [id number]  HPI  Patient here for a scheduled follow up. She reports she is doing relatively well.  Going to physical therapy for her right hip.  Has f/u planned with ortho.  Tries to stay active.  No chest pain or sob reported.  No abdominal pain or bowel change reported.  Handling stress.  Saw neurology 01/13/20 for f/u seizures.  Has been well controlled on carbatrol - stable.  Recommended f/u in one year.  She was seeing Dr Bary Castilla for f/u right breast cancer.  Last evaluated 06/2018.  Mammogram 06/2019 - Birads II.    Past Medical History:  Diagnosis Date  . Arthritis   . Breast cancer ([place]) 06/28/2017   Grade 3, DCIS, 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  . Hypercholesterolemia   . Hypertension   . Hypothyroidism   . 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 Sleep apnea    USES CPAP  . Ulcer    Gastric   Past Surgical History:  Procedure Laterality Date  . BREAST BIOPSY Right 06/28/2017   Stereo affirm- UOQ/DUCTAL CARCINOMA IN SITU, HIGH NUCLEAR GRADE WITH COMEDONECROSIS   . BREAST EXCISIONAL BIOPSY Right 07/17/2017   WIde excision upper outer quadrant DCIS  Dr. Bary Castilla  . BREAST LUMPECTOMY Right 07/17/2017   DCIS mammosite  . CESAREAN SECTION    . COLONOSCOPY N/A 10/05/2016   Procedure: COLONOSCOPY;  Surgeon: Manya Silvas, MD;  Location: [facility] ENDOSCOPY;  Service: Endoscopy;  Laterality: N/A;  Zosyn IVPB  . JOINT REPLACEMENT    . MASTECTOMY, PARTIAL Right 07/17/2017   Procedure: MASTECTOMY PARTIAL;  Surgeon: Robert Bellow, MD;  Location: [facility] ORS;  Service: General;  Laterality: Right;  . thumb surgery Right 2005   AND CTR  . THYROIDECTOMY  1988  . TOTAL KNEE ARTHROPLASTY Left 2012   Family History  Problem Relation Age of Onset  . Diabetes Father   . Dementia Mother   . Breast  cancer Neg Hx   . Colon cancer Neg Hx    Social History   Socioeconomic History  . Marital status: Married    Spouse name: Not on file  . Number of children: 1  . Years of education: Not on file  . Highest education level: Not on file  Occupational History    Employer: [employer]  Tobacco Use  . Smoking status: Former Smoker    Packs/day: 0.25    Years: 2.00    Pack years: 0.50    Types: Cigarettes    Quit date: 08/01/2017    Years since quitting: 2.5  . Smokeless tobacco: Never Used  . Tobacco comment: PT ONLY SMOKES SOCIALLY-SHE MAY GO A FEW MONTHS WITHOUT EVER SMOKING   Substance and Sexual Activity  . Alcohol use: Yes    Alcohol/week: 0.0 standard drinks    Comment: Socially  . Drug use: No  . Sexual activity: Yes  Other Topics Concern  . Not on file  Social History Narrative  . Not on file   Social Determinants of Health   Financial Resource Strain:   . Difficulty of Paying Living Expenses:   Food Insecurity:   . Worried About Charity fundraiser in the Last Year:   . [employer]  in the Last Year:   Transportation Needs:   . Film/video editor (Medical):   Marland Kitchen Lack of Transportation (Non-Medical):   Physical Activity:   . Days of Exercise per Week:   . Minutes of Exercise per Session:   Stress:   . Feeling of Stress :   Social Connections:   . Frequency of Communication with Friends and Family:   . Frequency of Social Gatherings with Friends and Family:   . Attends Religious Services:   . Active Member of Clubs or Organizations:   . Attends Archivist Meetings:   Marland Kitchen Marital Status:     Outpatient Encounter Medications as of 02/20/2020  Medication Sig  . amLODipine (NORVASC) 5 MG tablet TAKE 1 TABLET BY MOUTH EVERY DAY  . carbamazepine (CARBATROL) 200 MG 12 hr capsule TAKE 1 CAPSULE BY MOUTH TWICE A DAY  . diclofenac (VOLTAREN) 75 MG EC tablet Take 1 tablet (75 mg total) by mouth 2 (two) times daily.  . DULoxetine (CYMBALTA) 60 MG  capsule Take 1 capsule (60 mg total) by mouth daily.  . fluticasone (FLONASE) 50 MCG/ACT nasal spray Place 1 spray into both nostrils daily as needed for allergies or rhinitis.  Marland Kitchen gabapentin (NEURONTIN) 300 MG capsule Take 600 mg by mouth at bedtime.  Marland Kitchen levothyroxine (SYNTHROID) 175 MCG tablet TAKE 1 TABLET BY MOUTH EVERY DAY  . pravastatin (PRAVACHOL) 20 MG tablet TAKE 1 TABLET BY MOUTH EVERY DAY  . raloxifene (EVISTA) 60 MG tablet TAKE 1 TABLET BY MOUTH EVERY DAY  . sertraline (ZOLOFT) 50 MG tablet Take 1 tablet (50 mg total) by mouth daily.  . valsartan (DIOVAN) 160 MG tablet TAKE 1 TABLET BY MOUTH EVERY DAY  . [DISCONTINUED] KLOR-CON 10 10 MEQ tablet TAKE 1 TABLET (10 MEQ TOTAL) BY MOUTH 2 (TWO) TIMES DAILY.   No facility-administered encounter medications on file as of 02/20/2020.    Review of Systems  Constitutional: Negative for appetite change and unexpected weight change.  HENT: Negative for congestion and sinus pressure.   Respiratory: Negative for cough, chest tightness and shortness of breath.   Cardiovascular: Negative for chest pain, palpitations and leg swelling.  Gastrointestinal: Negative for abdominal pain, diarrhea, nausea and vomiting.  Genitourinary: Negative for difficulty urinating and dysuria.  Musculoskeletal: Negative for joint swelling and myalgias.       Seeing PT for right hip pain.    Skin: Negative for color change and rash.  Neurological: Negative for dizziness, light-headedness and headaches.  Psychiatric/Behavioral: Negative for agitation and dysphoric mood.       Objective:    Physical Exam Constitutional:      General: She is not in acute distress.    Appearance: Normal appearance.  HENT:     Head: Normocephalic and atraumatic.     Right Ear: External ear normal.     Left Ear: External ear normal.  Eyes:     General: No scleral icterus.       Right eye: No discharge.        Left eye: No discharge.     Conjunctiva/sclera: Conjunctivae  normal.  Neck:     Thyroid: No thyromegaly.  Cardiovascular:     Rate and Rhythm: Normal rate and regular rhythm.  Pulmonary:     Effort: No respiratory distress.     Breath sounds: Normal breath sounds. No wheezing.  Abdominal:     General: Bowel sounds are normal.     Palpations: Abdomen is soft.  Tenderness: There is no abdominal tenderness.  Musculoskeletal:        General: No swelling or tenderness.     Cervical back: Neck supple. No tenderness.  Lymphadenopathy:     Cervical: No cervical adenopathy.  Skin:    Findings: No erythema or rash.  Neurological:     Mental Status: She is alert.  Psychiatric:        Mood and Affect: Mood normal.        Behavior: Behavior normal.     BP 128/78   Pulse 93   Temp (!) 95.6 F (35.3 C)   Resp 16   Ht 5\' 8"  (1.727 m)   Wt 234 lb 9.6 oz (106.4 kg)   LMP 10/09/2012   SpO2 99%   BMI 35.67 kg/m  Wt Readings from Last 3 Encounters:  02/20/20 234 lb 9.6 oz (106.4 kg)  01/13/20 238 lb 9.6 oz (108.2 kg)  06/20/19 235 lb (106.6 kg)     Lab Results  Component Value Date   WBC 4.9 12/06/2018   HGB 13.3 12/06/2018   HCT 39.1 12/06/2018   PLT 233.0 12/06/2018   GLUCOSE 108 (H) 07/04/2019   CHOL 203 (H) 07/04/2019   TRIG 142.0 07/04/2019   HDL 59.30 07/04/2019   LDLDIRECT 112.0 06/04/2015   LDLCALC 116 (H) 07/04/2019   ALT 20 07/04/2019   AST 16 07/04/2019   NA 143 07/04/2019   K 3.8 07/04/2019   CL 104 07/04/2019   CREATININE 0.87 07/04/2019   BUN 17 07/04/2019   CO2 30 07/04/2019   TSH 1.93 07/04/2019   INR 1.41 08/12/2011   HGBA1C 5.2 07/04/2019    MR LUMBAR SPINE WO CONTRAST  Result Date: 08/09/2019 CLINICAL DATA:  Right leg pain since March 2020.  No known injury. EXAM: MRI LUMBAR SPINE WITHOUT CONTRAST TECHNIQUE: Multiplanar, multisequence MR imaging of the lumbar spine was performed. No intravenous contrast was administered. COMPARISON:  None. FINDINGS: Segmentation:  Standard. Alignment:  3 mm  retrolisthesis of L3 on L4. Vertebrae:  No fracture, evidence of discitis, or bone lesion. Conus medullaris and cauda equina: Conus extends to the L1 level. Conus and cauda equina appear normal. Paraspinal and other soft tissues: No acute paraspinal abnormality. Disc levels: Disc spaces: Degenerative disease with disc height loss throughout the lumbar spine. T10-11 and T11-12: Mild broad-based disc bulge. No central canal stenosis. T12-L1: No significant disc bulge. No evidence of neural foraminal stenosis. No central canal stenosis. L1-L2: Broad-based disc bulge. No evidence of neural foraminal stenosis. No central canal stenosis. L2-L3: Broad-based disc bulge flattening the ventral thecal sac. Mild bilateral facet arthropathy. Mild-moderate spinal stenosis. No foraminal stenosis. L3-L4: Broad-based disc osteophyte complex. Moderate bilateral facet arthropathy. Severe spinal stenosis no evidence of neural foraminal stenosis. L4-L5: Broad-based disc bulge. Moderate bilateral facet arthropathy. Moderate left foraminal stenosis. No right foraminal stenosis. Moderate spinal stenosis. L5-S1: Broad-based disc bulge with a small left paracentral disc protrusion. Mild bilateral facet arthropathy. Mild bilateral foraminal stenosis. No central canal stenosis. IMPRESSION: 1. Diffuse lumbar spine spondylosis as described above. 2.  No acute osseous injury of the lumbar spine. Electronically Signed   By: Kathreen Devoid   On: 08/09/2019 11:34       Assessment & Plan:   Problem List Items Addressed This Visit    Anxiety    Was seeing Dr Nicolasa Ducking.  On zoloft.  D/w pt regarding follow up.       Ductal carcinoma in situ (DCIS) of right breast  Has been followed by Dr Bary Castilla and was also seeing Dr Donella Stade.  Last mammogram 06/2019 - Birads II.  Confirm with pt f/u plans.        Essential hypertension, benign    On amlodipine.  Blood pressure under good control.  Follow pressures.  Follow metabolic panel.       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  Stable on carbatrol.       Relevant Orders   CBC with Differential/Platelet   Carbamazepine level, total   Hypercholesterolemia    On pravastatin.  Low cholesterol diet and exercise.  Follow lipid panel and liver function tests.        Relevant Orders   Hepatic function panel   Lipid panel   Obstructive sleep apnea    CPAP.        Other Visit Diagnoses    Hyperglycemia    -  Primary   Relevant Orders   Hemoglobin A1c       Einar Pheasant, MD

## 2020-02-21 ENCOUNTER — Ambulatory Visit: Payer: BC Managed Care – PPO | Admitting: Physical Therapy

## 2020-02-21 ENCOUNTER — Encounter: Payer: Self-pay | Admitting: Physical Therapy

## 2020-02-21 DIAGNOSIS — R262 Difficulty in walking, not elsewhere classified: Secondary | ICD-10-CM

## 2020-02-21 DIAGNOSIS — M25552 Pain in left hip: Secondary | ICD-10-CM | POA: Diagnosis not present

## 2020-02-21 DIAGNOSIS — M545 Low back pain, unspecified: Secondary | ICD-10-CM

## 2020-02-21 DIAGNOSIS — M25551 Pain in right hip: Secondary | ICD-10-CM | POA: Diagnosis not present

## 2020-02-21 NOTE — Therapy (Signed)
[facility] [address], [place], [place], [address] Phone: [phone]   Fax:  [phone]  Physical Therapy Treatment  Patient Details  Name: Jennifer Pratt MRN: [id number] Date of Birth: 04/07/1961 Referring Provider (PT): Normajean Glasgow   Encounter Date: 02/21/2020  PT End of Session - 02/21/20 0850    Visit Number  24    Date for PT Re-Evaluation  03/03/20    Authorization Type  BCBS    PT Start Time  0845    PT Stop Time  0925    PT Time Calculation (min)  40 min    Activity Tolerance  Patient tolerated treatment well;No increased pain    Behavior During Therapy  WFL for tasks assessed/performed       Past Medical History:  Diagnosis Date  . Arthritis   . Breast cancer ([place]) 06/28/2017   Grade 3, DCIS, 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  . Hypercholesterolemia   . Hypertension   . Hypothyroidism   . 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 Sleep apnea    USES CPAP  . Ulcer    Gastric    Past Surgical History:  Procedure Laterality Date  . BREAST BIOPSY Right 06/28/2017   Stereo affirm- UOQ/DUCTAL CARCINOMA IN SITU, HIGH NUCLEAR GRADE WITH COMEDONECROSIS   . BREAST EXCISIONAL BIOPSY Right 07/17/2017   WIde excision upper outer quadrant DCIS  Dr. Bary Castilla  . BREAST LUMPECTOMY Right 07/17/2017   DCIS mammosite  . CESAREAN SECTION    . COLONOSCOPY N/A 10/05/2016   Procedure: COLONOSCOPY;  Surgeon: Manya Silvas, MD;  Location: [facility] ENDOSCOPY;  Service: Endoscopy;  Laterality: N/A;  Zosyn IVPB  . JOINT REPLACEMENT    . MASTECTOMY, PARTIAL Right 07/17/2017   Procedure: MASTECTOMY PARTIAL;  Surgeon: Robert Bellow, MD;  Location: [facility] ORS;  Service: General;  Laterality: Right;  . thumb surgery Right 2005   AND CTR  . THYROIDECTOMY  1988  . TOTAL KNEE ARTHROPLASTY Left 2012    There were no vitals filed for this visit.  Subjective Assessment - 02/21/20 0848    Subjective   I see Dr. Latanya Maudlin next Wednesday. My hip felt good after last visit. I did alot of walking yesterday and my hip was tired. I still get alot of popping in my right hip and knee.    Pertinent History  left TKA    Limitations  Lifting;Standing;Walking    How long can you stand comfortably?  difficulty > 10 minutes    How long can you walk comfortably?  had to use a cane earlier in the summer    Patient Stated Goals  have less pain, walk better    Currently in Pain?  Yes    Pain Score  3     Pain Location  Hip    Pain Orientation  Right    Pain Descriptors / Indicators  Dull    Pain Type  Chronic pain    Pain Onset  More than a month ago    Pain Frequency  Intermittent    Aggravating Factors   walking and standing    Pain Relieving Factors  exercise, stretching    Multiple Pain Sites  No                       [facility] Adult PT Treatment/Exercise - 02/21/20 0001      Knee/Hip Exercises: Stretches  Hip Flexor Stretch  Right;2 reps;60 seconds;Left      Knee/Hip Exercises: Aerobic   Elliptical  level 2 for 5 minutes (2.5 min forward and backward)      Manual Therapy   Manual Therapy  Soft tissue mobilization;Myofascial release    Soft tissue mobilization  right gluteal, right quadriceps, iliopsoas, ITB, Rectus femoris, Piriformis    Myofascial Release  using the suction cup to pull the tissue off the quadriceps, ITB, along the glueteal, and REctus femorus    Passive ROM  to right hip extension and adduction       Trigger Point Dry Needling - 02/21/20 0001    Consent Given?  Yes    Education Handout Provided  Previously provided    Muscles Treated Lower Quadrant  Vastus lateralis;Rectus femoris;Quadriceps             PT Short Term Goals - 11/13/19 1417      PT SHORT TERM GOAL #1   Title  independent with initial HEP    Status  Achieved        PT Long Term Goals - 02/11/20 0849      PT LONG TERM GOAL #1   Title  decrease pain >/= 60% in bilateral hips  and sacrum due to decreased gait deficits    Baseline  decreased by 50%    Time  8    Period  Weeks    Status  On-going      PT LONG TERM GOAL #2   Title  walk with minimal gait deficits for for 30 minutes in a store with pain decreased >/= 60%    Time  8    Period  Weeks    Status  On-going      PT LONG TERM GOAL #3   Title  understand posture and correct  body mechanics with daily tasks to decreased strain on back and hips    Time  8    Period  Weeks    Status  Achieved      PT LONG TERM GOAL #4   Title  return to  advance HEP with minimal pain to furher increase strength    Time  8    Period  Weeks    Status  On-going            Plan - 02/21/20 [id number]    Clinical Impression Statement  Patient continues to walk with pain and clicking in the right hip. Patient has limited mobility in  right hip. Patient walks with a lateral shift to the left. Patient walks with the right leg abducted at times. Patient is working hard at home wiht her exericses and uses the bike. Patient reports the bike sometimes aggravates the right hip. Patient is able to do the elliiptical. Patient continues to have difficulty with putting the right shoe on. Patient will benefit from skilled therapy to improve gait deficits, reduce pain and improve strength.    Stability/Clinical Decision Making  Stable/Uncomplicated    Rehab Potential  Good    PT Frequency  2x / week    PT Duration  8 weeks    PT Treatment/Interventions  ADLs/Self Care Home Management;Cryotherapy;Electrical Stimulation;Iontophoresis 4mg /ml Dexamethasone;Moist Heat;Ultrasound;Gait training;Therapeutic activities;Therapeutic exercise;Balance training;Neuromuscular re-education;Manual techniques;Patient/family education;Dry needling    PT Next Visit Plan  see what MD says,    PT Home Exercise Plan  Access Code: [email or web address]    Consulted and Agree with Plan of Care  Patient  Patient will benefit from skilled therapeutic intervention in  order to improve the following deficits and impairments:  Abnormal gait, Pain, Increased muscle spasms, Decreased mobility, Decreased activity tolerance, Decreased range of motion, Decreased strength, Impaired flexibility, Difficulty walking  Visit Diagnosis: Pain in right hip  Difficulty in walking, not elsewhere classified  Acute midline low back pain without sciatica  Pain in left hip     Problem List Patient Active Problem List   Diagnosis Date Noted  . Vaginal burning 06/22/2019  . Anxiety 06/19/2018  . Ductal carcinoma in situ (DCIS) of right breast 07/08/2017  . Stress 04/09/2017  . Health care maintenance 06/14/2015  . Numbness of foot 06/09/2014  . Neck nodule 12/29/2013  . 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 Simple partial seizures ([place]) 09/11/2013  . Encounter for therapeutic drug monitoring 09/11/2013  . Hypokalemia 06/24/2013  . Essential hypertension, benign 01/11/2013  . Hypercholesterolemia 01/11/2013  . Seizure disorder ([place]) 01/11/2013  . Gastric ulcer 01/11/2013  . Hypothyroidism 01/11/2013  . Obstructive sleep apnea 01/11/2013    Earlie Counts, PT 02/21/20 9:26 AM   [facility] [address] [place], [place], [address] Phone: [phone]   Fax:  [phone]  Name: DOROTHE MITTEN MRN: [id number] Date of Birth: 1960-12-05

## 2020-02-24 ENCOUNTER — Encounter: Payer: Self-pay | Admitting: Internal Medicine

## 2020-02-24 ENCOUNTER — Telehealth: Payer: Self-pay | Admitting: Internal Medicine

## 2020-02-24 NOTE — Assessment & Plan Note (Signed)
On pravastatin.  Low cholesterol diet and exercise.  Follow lipid panel and liver function tests.   

## 2020-02-24 NOTE — Assessment & Plan Note (Signed)
On amlodipine.  Blood pressure under good control.  Follow pressures.  Follow metabolic panel.

## 2020-02-24 NOTE — Assessment & Plan Note (Signed)
Has been followed by Dr Bary Castilla and was also seeing Dr Donella Stade.  Last mammogram 06/2019 - Birads II.  Confirm with pt f/u plans.

## 2020-02-24 NOTE — Assessment & Plan Note (Signed)
CPAP.  

## 2020-02-24 NOTE — Assessment & Plan Note (Signed)
Was seeing Dr Nicolasa Ducking.  On zoloft.  D/w pt regarding follow up.

## 2020-02-24 NOTE — Telephone Encounter (Signed)
Jennifer Pratt previously saw Dr Bary Castilla for her breast.  Need to confirm with pt if has f/u scheduled.  If not, will need to schedule. Also, see if has f/u scheduled with Dr Donella Stade.

## 2020-02-24 NOTE — Assessment & Plan Note (Signed)
Followed by neurology as outlined.  Stable on carbatrol.

## 2020-02-25 ENCOUNTER — Encounter: Payer: BC Managed Care – PPO | Admitting: Physical Therapy

## 2020-02-26 DIAGNOSIS — M1711 Unilateral primary osteoarthritis, right knee: Secondary | ICD-10-CM | POA: Diagnosis not present

## 2020-02-26 DIAGNOSIS — M1611 Unilateral primary osteoarthritis, right hip: Secondary | ICD-10-CM | POA: Diagnosis not present

## 2020-02-26 NOTE — Telephone Encounter (Signed)
Patient stated that she does not have f/u Dr Nicolasa Ducking since last summer. She had one or two virtuals with her but Dr Nicolasa Ducking told her that she did not feel visits were necessary anymore since pt was doing well. Pt does not plan to see psychiatry. Just needs her zoloft followed by you if ok with you

## 2020-02-27 ENCOUNTER — Other Ambulatory Visit: Payer: Self-pay

## 2020-02-27 ENCOUNTER — Ambulatory Visit: Payer: BC Managed Care – PPO | Attending: Physical Medicine and Rehabilitation | Admitting: Physical Therapy

## 2020-02-27 ENCOUNTER — Encounter: Payer: Self-pay | Admitting: Physical Therapy

## 2020-02-27 DIAGNOSIS — M25551 Pain in right hip: Secondary | ICD-10-CM

## 2020-02-27 DIAGNOSIS — R262 Difficulty in walking, not elsewhere classified: Secondary | ICD-10-CM

## 2020-02-27 DIAGNOSIS — M545 Low back pain, unspecified: Secondary | ICD-10-CM

## 2020-02-27 MED ORDER — SERTRALINE HCL 50 MG PO TABS: 50.0000 mg | ORAL_TABLET | Freq: Every day | ORAL | 1 refills | Status: DC

## 2020-02-27 NOTE — Telephone Encounter (Signed)
rx sent in for zoloft.  Pt did have f/u with Dr Bary Castilla.  See note.

## 2020-02-27 NOTE — Addendum Note (Signed)
Addended by: Alisa Graff on: 02/27/2020 04:24 AM   Modules accepted: Orders

## 2020-02-27 NOTE — Therapy (Signed)
[facility] [address] [place], [place], [address] Phone: [phone]   Fax:  [phone]  Physical Therapy Treatment  Patient Details  Name: SITLALY GUDIEL MRN: [id number] Date of Birth: Apr 12, 1961 Referring Provider (PT): Normajean Glasgow   Encounter Date: 02/27/2020  PT End of Session - 02/27/20 0922    Visit Number  25    Date for PT Re-Evaluation  03/03/20    Authorization Type  BCBS    PT Start Time  0845    PT Stop Time  0923    PT Time Calculation (min)  38 min    Activity Tolerance  Patient tolerated treatment well;No increased pain    Behavior During Therapy  WFL for tasks assessed/performed       Past Medical History:  Diagnosis Date  . Arthritis   . 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  . Hypercholesterolemia   . Hypertension   . Hypothyroidism   . 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 Sleep apnea    USES CPAP  . Ulcer    Gastric    Past Surgical History:  Procedure Laterality Date  . BREAST BIOPSY Right 06/28/2017   Stereo affirm- UOQ/DUCTAL CARCINOMA IN SITU, HIGH NUCLEAR GRADE WITH COMEDONECROSIS   . BREAST EXCISIONAL BIOPSY Right 07/17/2017   WIde excision upper outer quadrant DCIS  Dr. Bary Castilla  . BREAST LUMPECTOMY Right 07/17/2017   DCIS mammosite  . CESAREAN SECTION    . COLONOSCOPY N/A 10/05/2016   Procedure: COLONOSCOPY;  Surgeon: Manya Silvas, MD;  Location: [facility] ENDOSCOPY;  Service: Endoscopy;  Laterality: N/A;  Zosyn IVPB  . JOINT REPLACEMENT    . MASTECTOMY, PARTIAL Right 07/17/2017   Procedure: MASTECTOMY PARTIAL;  Surgeon: Robert Bellow, MD;  Location: [facility] ORS;  Service: General;  Laterality: Right;  . thumb surgery Right 2005   AND CTR  . THYROIDECTOMY  1988  . TOTAL KNEE ARTHROPLASTY Left 2012    There were no vitals filed for this visit.  Subjective Assessment - 02/27/20 0851    Subjective   I saw my Dr. Tylene Fantasia and he said I will need an anterior hip replacement.    Pertinent History  left TKA    Limitations  Lifting;Standing;Walking    How long can you stand comfortably?  difficulty > 10 minutes    How long can you walk comfortably?  had to use a cane earlier in the summer    Patient Stated Goals  have less pain, walk better    Currently in Pain?  Yes    Pain Score  3     Pain Location  Hip    Pain Orientation  Right    Pain Descriptors / Indicators  Dull    Pain Type  Chronic pain    Pain Radiating Towards  radiates down into the right lateral thigh and lateral lower leg    Pain Onset  More than a month ago    Pain Frequency  Intermittent    Aggravating Factors   walking and standing    Pain Relieving Factors  exercise, stretching    Multiple Pain Sites  No         [facility] PT Assessment - 02/27/20 0001      Assessment   Medical Diagnosis  right hip and left hip pain    Referring Provider (PT)  Normajean Glasgow  Onset Date/Surgical Date  10/07/19      Precautions   Precautions  Other (comment)    Precaution Comments  breast cancer      Balance Screen   Has the patient fallen in the past 6 months  No    Has the patient had a decrease in activity level because of a fear of falling?   No    Is the patient reluctant to leave their home because of a fear of falling?   No      Prior Function   Level of Independence  Independent    Vocation  Full time employment    Vocation Requirements  accounting    Leisure  rides motorcycles      Cognition   Overall Cognitive Status  Within Functional Limits for tasks assessed      Posture/Postural Control   Posture/Postural Control  Postural limitations    Posture Comments  left hip shifted to the left      ROM / Strength   AROM / PROM / Strength  AROM;PROM;Strength      PROM   Right Hip Extension  5    Right Hip Flexion  115   started at 90 degrees, after manual work was 115   Right Hip External Rotation   55    Right  Hip Internal Rotation   15    Right Hip ABduction  20                   [facility] Adult PT Treatment/Exercise - 02/27/20 0001      Knee/Hip Exercises: Stretches   Hip Flexor Stretch  Right;2 reps;60 seconds;Left      Knee/Hip Exercises: Aerobic   Elliptical  level 2 for 5 minutes (2.5 min forward and backward)      Manual Therapy   Manual Therapy  Soft tissue mobilization    Soft tissue mobilization  right quadratus, vastus lateralis, peroneals, ITB       Trigger Point Dry Needling - 02/27/20 0001    Consent Given?  Yes    Education Handout Provided  Previously provided    Muscles Treated Lower Quadrant  Peroneals    Peroneals Response  Twitch response elicited;Palpable increased muscle length   right            PT Short Term Goals - 11/13/19 1417      PT SHORT TERM GOAL #1   Title  independent with initial HEP    Status  Achieved        PT Long Term Goals - 02/27/20 0917      PT LONG TERM GOAL #1   Title  decrease pain >/= 60% in bilateral hips and sacrum due to decreased gait deficits    Time  8    Period  Weeks    Status  Achieved      PT LONG TERM GOAL #2   Title  walk with minimal gait deficits for for 30 minutes in a store with pain decreased >/= 60%    Baseline  only with a cart    Time  8    Period  Weeks    Status  Not Met      PT LONG TERM GOAL #3   Title  understand posture and correct  body mechanics with daily tasks to decreased strain on back and hips    Time  8    Period  Weeks    Status  Achieved  PT LONG TERM GOAL #4   Title  return to  advance HEP with minimal pain to furher increase strength    Time  8    Period  Weeks    Status  Achieved            Plan - 02/27/20 0924    Clinical Impression Statement  Patient has seen her orthopedist and he is suggesting a right total hip. Patient has changes in the right hip joint. Patient is doing her HEP. Patient has increase tissue mobility of the right thigh and  peroneals. Patient reports her right hip pain is 60% better. Patient has improve right hip ROM. Patient pain is lower if she is holding onto a cart while in the store. Patient is ready for discharge.    Stability/Clinical Decision Making  Stable/Uncomplicated    Rehab Potential  Good    PT Treatment/Interventions  ADLs/Self Care Home Management;Cryotherapy;Electrical Stimulation;Iontophoresis 79m/ml Dexamethasone;Moist Heat;Ultrasound;Gait training;Therapeutic activities;Therapeutic exercise;Balance training;Neuromuscular re-education;Manual techniques;Patient/family education;Dry needling    PT Next Visit Plan  Discharge to HEP this visit    PT Home Exercise Plan  Access Code: [email or web address]    Consulted and Agree with Plan of Care  Patient       Patient will benefit from skilled therapeutic intervention in order to improve the following deficits and impairments:  Abnormal gait, Pain, Increased muscle spasms, Decreased mobility, Decreased activity tolerance, Decreased range of motion, Decreased strength, Impaired flexibility, Difficulty walking  Visit Diagnosis: Pain in right hip  Difficulty in walking, not elsewhere classified  Acute midline low back pain without sciatica     Problem List Patient Active Problem List   Diagnosis Date Noted  . Vaginal burning 06/22/2019  . Anxiety 06/19/2018  . Ductal carcinoma in situ (DCIS) of right breast 07/08/2017  . Stress 04/09/2017  . Health care maintenance 06/14/2015  . Numbness of foot 06/09/2014  . Neck nodule 12/29/2013  . 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 [place] partial seizures ([place] 09/11/2013  . Encounter for therapeutic drug monitoring 09/11/2013  . Hypokalemia 06/24/2013  . Essential hypertension, benign 01/11/2013  . Hypercholesterolemia 01/11/2013  . [place] ([place] 01/11/2013  . Gastric ulcer 01/11/2013  . Hypothyroidism 01/11/2013  . Obstructive sleep apnea 01/11/2013    CEarlie Counts  PT 02/27/20 9:26 AM   [facility] [address] [place] [address]: [phone]  Fax:  [phone] Name: AINOCENCIA MURTAUGHMRN: [id number] of Birth: 306-Jul-1962 PHYSICAL THERAPY DISCHARGE SUMMARY  Visits from Start of Care: 25  Current functional level related to goals / functional outcomes: See above.    Remaining deficits: See above.    Education / Equipment: HEP Plan: Patient agrees to discharge.  Patient goals were partially met. Patient is being discharged due to being pleased with the current functional level.  Thank you for the referral. CEarlie Counts PT 02/27/20 9:27 AM  ?????

## 2020-03-02 ENCOUNTER — Other Ambulatory Visit: Payer: Self-pay | Admitting: Internal Medicine

## 2020-03-09 DIAGNOSIS — M1611 Unilateral primary osteoarthritis, right hip: Secondary | ICD-10-CM | POA: Diagnosis not present

## 2020-03-12 ENCOUNTER — Other Ambulatory Visit: Payer: BC Managed Care – PPO

## 2020-03-16 ENCOUNTER — Other Ambulatory Visit: Payer: Self-pay | Admitting: Internal Medicine

## 2020-03-26 ENCOUNTER — Encounter: Payer: Self-pay | Admitting: Internal Medicine

## 2020-03-26 NOTE — Telephone Encounter (Signed)
I am ok to order labs, but I will need to see her in office prior to surgery.  When is her surgery planned.

## 2020-03-27 ENCOUNTER — Other Ambulatory Visit: Payer: Self-pay

## 2020-03-27 DIAGNOSIS — Z01818 Encounter for other preprocedural examination: Secondary | ICD-10-CM

## 2020-03-30 NOTE — Telephone Encounter (Signed)
Ok to schedule appt on 04/03/20.

## 2020-03-30 NOTE — Telephone Encounter (Signed)
If she needs her blood work done by 5/10. Are you ok to see her for pre-op on 5/7 at 8:30 and do everything in one OV or would you prefer to see her closer to surgery date?

## 2020-03-31 NOTE — Telephone Encounter (Signed)
Pt scheduled for 5/7 and pre-op paperwork filed in [place] folder for appt date.

## 2020-03-31 NOTE — Telephone Encounter (Signed)
Left message for patient to call back. Need to schedule pre op appt.

## 2020-04-03 ENCOUNTER — Encounter: Payer: Self-pay | Admitting: Internal Medicine

## 2020-04-03 ENCOUNTER — Ambulatory Visit: Payer: BC Managed Care – PPO | Admitting: Internal Medicine

## 2020-04-03 ENCOUNTER — Other Ambulatory Visit: Payer: Self-pay

## 2020-04-03 DIAGNOSIS — I10 Essential (primary) hypertension: Secondary | ICD-10-CM | POA: Diagnosis not present

## 2020-04-03 DIAGNOSIS — E7800 Pure hypercholesterolemia, unspecified: Secondary | ICD-10-CM | POA: Diagnosis not present

## 2020-04-03 DIAGNOSIS — D0511 Intraductal carcinoma in situ of right breast: Secondary | ICD-10-CM

## 2020-04-03 DIAGNOSIS — Z01818 Encounter for other preprocedural examination: Secondary | ICD-10-CM

## 2020-04-03 DIAGNOSIS — E039 Hypothyroidism, unspecified: Secondary | ICD-10-CM

## 2020-04-03 DIAGNOSIS — R739 Hyperglycemia, unspecified: Secondary | ICD-10-CM | POA: Diagnosis not present

## 2020-04-03 DIAGNOSIS — G40909 Epilepsy, unspecified, not intractable, without status epilepticus: Secondary | ICD-10-CM

## 2020-04-03 DIAGNOSIS — G40309 Generalized idiopathic epilepsy and epileptic syndromes, not intractable, without status epilepticus: Secondary | ICD-10-CM | POA: Diagnosis not present

## 2020-04-03 DIAGNOSIS — F419 Anxiety disorder, unspecified: Secondary | ICD-10-CM

## 2020-04-03 DIAGNOSIS — G4733 Obstructive sleep apnea (adult) (pediatric): Secondary | ICD-10-CM

## 2020-04-03 LAB — HEPATIC FUNCTION PANEL
ALT: 21 U/L (ref 0–35)
AST: 15 U/L (ref 0–37)
Albumin: 4.4 g/dL (ref 3.5–5.2)
Alkaline Phosphatase: 66 U/L (ref 39–117)
Bilirubin, Direct: 0.1 mg/dL (ref 0.0–0.3)
Total Bilirubin: 0.5 mg/dL (ref 0.2–1.2)
Total Protein: 6.8 g/dL (ref 6.0–8.3)

## 2020-04-03 LAB — CBC WITH DIFFERENTIAL/PLATELET
Basophils Absolute: 0.1 10*3/uL (ref 0.0–0.1)
Basophils Relative: 1.4 % (ref 0.0–3.0)
Eosinophils Absolute: 0.1 10*3/uL (ref 0.0–0.7)
Eosinophils Relative: 1.7 % (ref 0.0–5.0)
HCT: 38.3 % (ref 36.0–46.0)
Hemoglobin: 13.1 g/dL (ref 12.0–15.0)
Lymphocytes Relative: 37.9 % (ref 12.0–46.0)
Lymphs Abs: 1.7 10*3/uL (ref 0.7–4.0)
MCHC: 34.2 g/dL (ref 30.0–36.0)
MCV: 92.4 fl (ref 78.0–100.0)
Monocytes Absolute: 0.3 10*3/uL (ref 0.1–1.0)
Monocytes Relative: 6.7 % (ref 3.0–12.0)
Neutro Abs: 2.4 10*3/uL (ref 1.4–7.7)
Neutrophils Relative %: 52.3 % (ref 43.0–77.0)
Platelets: 222 10*3/uL (ref 150.0–400.0)
RBC: 4.15 Mil/uL (ref 3.87–5.11)
RDW: 12.7 % (ref 11.5–15.5)
WBC: 4.6 10*3/uL (ref 4.0–10.5)

## 2020-04-03 LAB — LIPID PANEL
Cholesterol: 198 mg/dL (ref 0–200)
HDL: 54.8 mg/dL (ref >39.00)
LDL Cholesterol: 114 mg/dL — ABNORMAL HIGH (ref 0–99)
NonHDL: 143.47
Total CHOL/HDL Ratio: 4
Triglycerides: 146 mg/dL (ref 0.0–149.0)
VLDL: 29.2 mg/dL (ref 0.0–40.0)

## 2020-04-03 LAB — BASIC METABOLIC PANEL
BUN: 15 mg/dL (ref 6–23)
CO2: 28 mEq/L (ref 19–32)
Calcium: 9.1 mg/dL (ref 8.4–10.5)
Chloride: 106 mEq/L (ref 96–112)
Creatinine, Ser: 0.77 mg/dL (ref 0.40–1.20)
GFR: 76.95 mL/min (ref >60.00)
Glucose, Bld: 106 mg/dL — ABNORMAL HIGH (ref 70–99)
Potassium: 4 mEq/L (ref 3.5–5.1)
Sodium: 139 mEq/L (ref 135–145)

## 2020-04-03 LAB — PROTIME-INR
INR: 1 ratio (ref 0.8–1.0)
Prothrombin Time: 11 s (ref 9.6–13.1)

## 2020-04-03 LAB — HEMOGLOBIN A1C: Hgb A1c MFr Bld: 5.1 % (ref 4.6–6.5)

## 2020-04-03 NOTE — Assessment & Plan Note (Signed)
On pravastatin.  Low cholesterol diet and exercise.  Follow lipid panel and liver function tests.   

## 2020-04-03 NOTE — Assessment & Plan Note (Signed)
On amlodipine.  Blood pressure as outlined.  Will need close intra op and post op monitoring of heart rate and blood pressure to avoid extremes.  Check metabolic panel.

## 2020-04-03 NOTE — Progress Notes (Signed)
Patient ID: Jennifer Pratt, female   DOB: [birth year], 59 y.o.   MRN: [id number]   Subjective:    Patient ID: Jennifer Pratt, female    DOB: December 27, 1960, 59 y.o.   MRN: [id number]  HPI This visit occurred during the SARS-CoV-2 public health emergency.  Safety protocols were in place, including screening questions prior to the visit, additional usage of staff PPE, and extensive cleaning of exam room while observing appropriate contact time as indicated for disinfecting solutions.  Patient here for pre op evaluation. She is planning to have total hip replacement - right.  She feels (other than her hip), she is doing well.  No chest pain.  No sob.  No acid reflux reported.  No nausea or vomiting.  No bowel change.  With weather change, she will notice some drainage and cough from this, but denies any chest congestion or chest tightness.  Uses steroid nasal spray.     Past Medical History:  Diagnosis Date  . Arthritis   . 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  . Hypercholesterolemia   . Hypertension   . Hypothyroidism   . 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 Sleep apnea    USES CPAP  . Ulcer    Gastric   Past Surgical History:  Procedure Laterality Date  . BREAST BIOPSY Right 06/28/2017   Stereo affirm- UOQ/DUCTAL CARCINOMA IN SITU, HIGH NUCLEAR GRADE WITH COMEDONECROSIS   . BREAST EXCISIONAL BIOPSY Right 07/17/2017   WIde excision upper outer quadrant DCIS  Dr. Bary Castilla  . BREAST LUMPECTOMY Right 07/17/2017   DCIS mammosite  . CESAREAN SECTION    . COLONOSCOPY N/A 10/05/2016   Procedure: COLONOSCOPY;  Surgeon: Manya Silvas, MD;  Location: [facility] ENDOSCOPY;  Service: Endoscopy;  Laterality: N/A;  Zosyn IVPB  . JOINT REPLACEMENT    . MASTECTOMY, PARTIAL Right 07/17/2017   Procedure: MASTECTOMY PARTIAL;  Surgeon: Robert Bellow, MD;  Location: [facility] ORS;  Service: General;  Laterality: Right;  . thumb surgery  Right 2005   AND CTR  . THYROIDECTOMY  1988  . TOTAL KNEE ARTHROPLASTY Left 2012   Family History  Problem Relation Age of Onset  . Diabetes Father   . Dementia Mother   . Breast cancer Neg Hx   . Colon cancer Neg Hx    Social History   Socioeconomic History  . Marital status: Married    Spouse name: Not on file  . Number of children: 1  . Years of education: Not on file  . Highest education level: Not on file  Occupational History    Employer: [employer]  Tobacco Use  . Smoking status: Former Smoker    Packs/day: 0.25    Years: 2.00    Pack years: 0.50    Types: Cigarettes    Quit date: 08/01/2017    Years since quitting: 2.6  . Smokeless tobacco: Never Used  . Tobacco comment: PT ONLY SMOKES SOCIALLY-SHE MAY GO A FEW MONTHS WITHOUT EVER SMOKING   Substance and Sexual Activity  . Alcohol use: Yes    Alcohol/week: 0.0 standard drinks    Comment: Socially  . Drug use: No  . Sexual activity: Yes  Other Topics Concern  . Not on file  Social History Narrative  . Not on file   Social Determinants of Health   Financial Resource Strain:   . Difficulty of Paying Living Expenses:  Food Insecurity:   . Worried About Charity fundraiser in the Last Year:   . Arboriculturist in the Last Year:   Transportation Needs:   . Film/video editor (Medical):   Marland Kitchen Lack of Transportation (Non-Medical):   Physical Activity:   . Days of Exercise per Week:   . Minutes of Exercise per Session:   Stress:   . Feeling of Stress :   Social Connections:   . Frequency of Communication with Friends and Family:   . Frequency of Social Gatherings with Friends and Family:   . Attends Religious Services:   . Active Member of Clubs or Organizations:   . Attends Archivist Meetings:   Marland Kitchen Marital Status:     Outpatient Encounter Medications as of 04/03/2020  Medication Sig  . amLODipine (NORVASC) 5 MG tablet TAKE 1 TABLET BY MOUTH EVERY DAY  . carbamazepine (CARBATROL) 200  MG 12 hr capsule TAKE 1 CAPSULE BY MOUTH TWICE A DAY  . diclofenac (VOLTAREN) 75 MG EC tablet Take 1 tablet (75 mg total) by mouth 2 (two) times daily.  . DULoxetine (CYMBALTA) 60 MG capsule Take 1 capsule (60 mg total) by mouth daily.  . fluticasone (FLONASE) 50 MCG/ACT nasal spray Place 1 spray into both nostrils daily as needed for allergies or rhinitis.  Marland Kitchen gabapentin (NEURONTIN) 300 MG capsule Take 600 mg by mouth at bedtime.  Marland Kitchen levothyroxine (SYNTHROID) 175 MCG tablet TAKE 1 TABLET BY MOUTH EVERY DAY  . pravastatin (PRAVACHOL) 20 MG tablet TAKE 1 TABLET BY MOUTH EVERY DAY  . raloxifene (EVISTA) 60 MG tablet TAKE 1 TABLET BY MOUTH EVERY DAY  . sertraline (ZOLOFT) 50 MG tablet Take 1 tablet (50 mg total) by mouth daily.  . valsartan (DIOVAN) 160 MG tablet TAKE 1 TABLET BY MOUTH EVERY DAY  . [DISCONTINUED] KLOR-CON 10 10 MEQ tablet TAKE 1 TABLET (10 MEQ TOTAL) BY MOUTH 2 (TWO) TIMES DAILY.   No facility-administered encounter medications on file as of 04/03/2020.    Review of Systems  Constitutional: Negative for appetite change and unexpected weight change.  HENT: Negative for congestion.        Some intermittent drainage.    Respiratory: Negative for chest tightness and shortness of breath.        Some cough associated with drainage, but no increased cough or chest congestion.    Cardiovascular: Negative for chest pain, palpitations and leg swelling.  Gastrointestinal: Negative for abdominal pain, diarrhea, nausea and vomiting.  Genitourinary: Negative for difficulty urinating and dysuria.  Musculoskeletal: Negative for joint swelling and myalgias.  Skin: Negative for color change and rash.  Neurological: Negative for dizziness, light-headedness and headaches.  Psychiatric/Behavioral: Negative for agitation and dysphoric mood.       Objective:    Physical Exam Vitals reviewed.  Constitutional:      General: She is not in acute distress.    Appearance: Normal appearance.  HENT:      Head: Normocephalic and atraumatic.     Right Ear: External ear normal.     Left Ear: External ear normal.  Eyes:     General: No scleral icterus.       Right eye: No discharge.        Left eye: No discharge.     Conjunctiva/sclera: Conjunctivae normal.  Neck:     Thyroid: No thyromegaly.  Cardiovascular:     Rate and Rhythm: Normal rate and regular rhythm.  Pulmonary:  Effort: No respiratory distress.     Breath sounds: Normal breath sounds. No wheezing.  Abdominal:     General: Bowel sounds are normal.     Palpations: Abdomen is soft.     Tenderness: There is no abdominal tenderness.  Musculoskeletal:        General: No swelling or tenderness.     Cervical back: Neck supple. No tenderness.  Lymphadenopathy:     Cervical: No cervical adenopathy.  Skin:    Findings: No erythema or rash.  Neurological:     Mental Status: She is alert.  Psychiatric:        Behavior: Behavior normal.     BP 130/64   Pulse 90   Temp (!) 96.9 F (36.1 C)   Resp 16   Wt 232 lb (105.2 kg)   LMP 10/09/2012   SpO2 98%   BMI 35.28 kg/m  Wt Readings from Last 3 Encounters:  04/03/20 232 lb (105.2 kg)  02/20/20 234 lb 9.6 oz (106.4 kg)  01/13/20 238 lb 9.6 oz (108.2 kg)     Lab Results  Component Value Date   WBC 4.9 12/06/2018   HGB 13.3 12/06/2018   HCT 39.1 12/06/2018   PLT 233.0 12/06/2018   GLUCOSE 108 (H) 07/04/2019   CHOL 203 (H) 07/04/2019   TRIG 142.0 07/04/2019   HDL 59.30 07/04/2019   LDLDIRECT 112.0 06/04/2015   LDLCALC 116 (H) 07/04/2019   ALT 20 07/04/2019   AST 16 07/04/2019   NA 143 07/04/2019   K 3.8 07/04/2019   CL 104 07/04/2019   CREATININE 0.87 07/04/2019   BUN 17 07/04/2019   CO2 30 07/04/2019   TSH 1.93 07/04/2019   INR 1.41 08/12/2011   HGBA1C 5.2 07/04/2019    MR LUMBAR SPINE WO CONTRAST  Result Date: 08/09/2019 CLINICAL DATA:  Right leg pain since March 2020.  No known injury. EXAM: MRI LUMBAR SPINE WITHOUT CONTRAST TECHNIQUE:  Multiplanar, multisequence MR imaging of the lumbar spine was performed. No intravenous contrast was administered. COMPARISON:  None. FINDINGS: Segmentation:  Standard. Alignment:  3 mm retrolisthesis of L3 on L4. Vertebrae:  No fracture, evidence of discitis, or bone lesion. Conus medullaris and cauda equina: Conus extends to the L1 level. Conus and cauda equina appear normal. Paraspinal and other soft tissues: No acute paraspinal abnormality. Disc levels: Disc spaces: Degenerative disease with disc height loss throughout the lumbar spine. T10-11 and T11-12: Mild broad-based disc bulge. No central canal stenosis. T12-L1: No significant disc bulge. No evidence of neural foraminal stenosis. No central canal stenosis. L1-L2: Broad-based disc bulge. No evidence of neural foraminal stenosis. No central canal stenosis. L2-L3: Broad-based disc bulge flattening the ventral thecal sac. Mild bilateral facet arthropathy. Mild-moderate spinal stenosis. No foraminal stenosis. L3-L4: Broad-based disc osteophyte complex. Moderate bilateral facet arthropathy. Severe spinal stenosis no evidence of neural foraminal stenosis. L4-L5: Broad-based disc bulge. Moderate bilateral facet arthropathy. Moderate left foraminal stenosis. No right foraminal stenosis. Moderate spinal stenosis. L5-S1: Broad-based disc bulge with a small left paracentral disc protrusion. Mild bilateral facet arthropathy. Mild bilateral foraminal stenosis. No central canal stenosis. IMPRESSION: 1. Diffuse lumbar spine spondylosis as described above. 2.  No acute osseous injury of the lumbar spine. Electronically Signed   By: Kathreen Devoid   On: 08/09/2019 11:34       Assessment & Plan:   Problem List Items Addressed This Visit    Anxiety    Was seeing Dr Nicolasa Ducking.  Stable.  Continues on zoloft and  cymbalta.       Ductal carcinoma in situ (DCIS) of right breast    Has been followed by Dr Bary Castilla.  Last mammogram 06/2019 - Birads II.  Will need continued f/u  with Dr Bary Castilla.       Essential hypertension, benign    On amlodipine.  Blood pressure as outlined.  Will need close intra op and post op monitoring of heart rate and blood pressure to avoid extremes.  Check metabolic panel.        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  Low cholesterol diet and exercise.  Follow lipid panel and liver function tests.        Hypothyroidism    On thyroid replacement.  Follow tsh.       Obstructive sleep apnea    CPAP.       Pre-op evaluation    Planning for hip surgery as outlined.  Reports no chest pain or sob.  EKG - SR with no acute ischemic changes.  Given no symptoms and given EKG findings, I feel she is at low risk to proceed with the planned surgery.  She will need close intra op and post op monitoring of her heart rate and blood pressure to avoid extremes.  Will check recommended labs and forward results.        Seizure disorder [facility])    Has been followed by neurology.  On carbatrol.  Check level with labs today.        Other Visit Diagnoses    Pre-operative clearance       Hyperglycemia           Einar Pheasant, MD

## 2020-04-03 NOTE — Assessment & Plan Note (Signed)
Has been followed by neurology.  On carbatrol.  Check level with labs today.

## 2020-04-03 NOTE — Assessment & Plan Note (Signed)
Was seeing Dr Nicolasa Ducking.  Stable.  Continues on zoloft and cymbalta.

## 2020-04-03 NOTE — Assessment & Plan Note (Addendum)
CPAP.  

## 2020-04-03 NOTE — Assessment & Plan Note (Signed)
Has been followed by Dr Bary Castilla.  Last mammogram 06/2019 - Birads II.  Will need continued f/u with Dr Bary Castilla.

## 2020-04-03 NOTE — Assessment & Plan Note (Signed)
On thyroid replacement.  Follow tsh.  

## 2020-04-03 NOTE — Assessment & Plan Note (Signed)
Planning for hip surgery as outlined.  Reports no chest pain or sob.  EKG - SR with no acute ischemic changes.  Given no symptoms and given EKG findings, I feel she is at low risk to proceed with the planned surgery.  She will need close intra op and post op monitoring of her heart rate and blood pressure to avoid extremes.  Will check recommended labs and forward results.

## 2020-04-04 ENCOUNTER — Encounter: Payer: Self-pay | Admitting: Internal Medicine

## 2020-04-04 ENCOUNTER — Telehealth: Payer: Self-pay | Admitting: Internal Medicine

## 2020-04-04 LAB — CARBAMAZEPINE LEVEL, TOTAL: Carbamazepine Lvl: <2 mg/L — ABNORMAL LOW (ref 4.0–12.0)

## 2020-04-04 NOTE — Telephone Encounter (Signed)
Need to send lab result (carbatrol level) to her neurologist.  She is followed at [facility].  Her carbamazepine level is low.  Please call and fax and make sure they are aware of level.  May want to adjust. She reports she has been taking.  Planned surgery Wednesday.  Please call and notify them of labs asap.  Let me know if any problems.

## 2020-04-04 NOTE — Telephone Encounter (Signed)
See telephone message regarding her carbatrol level.

## 2020-04-06 NOTE — Telephone Encounter (Signed)
Labs faxed over to neurology. Unable to reach anyone on the phone at [facility]. Will try again.

## 2020-04-06 NOTE — Telephone Encounter (Signed)
See phone note

## 2020-04-07 ENCOUNTER — Telehealth: Payer: Self-pay | Admitting: Neurology

## 2020-04-07 DIAGNOSIS — G40909 Epilepsy, unspecified, not intractable, without status epilepticus: Secondary | ICD-10-CM

## 2020-04-07 NOTE — Telephone Encounter (Signed)
Spoke with neurology to make aware sending over labs. Advised to call me if they do not receive.

## 2020-04-07 NOTE — Telephone Encounter (Signed)
Jennifer Pratt from Dr. Bary Leriche office called wanting to inform RN and Provider that she has faxed over lab results for the pt just in case medication is needed to be adjusted. Please call Jennifer Pratt if they have not been received.

## 2020-04-09 NOTE — Telephone Encounter (Signed)
LMVm for pt to return call about her lab results and adherence to her sz medications.

## 2020-04-09 NOTE — Telephone Encounter (Signed)
I called and notes have not been received.  I called and spoke to [place] and she will fax to [phone].

## 2020-04-09 NOTE — Telephone Encounter (Signed)
Reviewed the chart.  I recently saw Jennifer Pratt in February 2021 for follow-up of seizures, well controlled on Carbatrol.  Her last seizure was about 20 years ago.  She has clearly done well.  Recent carbamazepine level from PCP was low, < 2.0.  I question if the patient may have missed a dose?  Her previous levels have been around 5 or 6. I would recheck the blood level if adherence is reported before making any adjustments.  Again, she has done quite well over the last 20 years on same dose of Carbatrol 200 mg twice daily.

## 2020-04-09 NOTE — Telephone Encounter (Signed)
Pt returned call.  She is taking her carbatrol as ordered.  She has not missed doses.  She is taking gabapentin which she has been on.  No seizures.  She had her labs drawn since 2019 by pcp, (was not trough level, she takes at 0530 and had her labs drawn about 0830).  Only new change is had the covid vaccine x 2 since seen.

## 2020-04-09 NOTE — Addendum Note (Signed)
Addended by: Brandon Melnick on: 04/09/2020 02:46 PM   Modules accepted: Orders

## 2020-04-09 NOTE — Telephone Encounter (Signed)
Received. To SS/NP desk. Looking I see it is in Epic, carbamazepine low.

## 2020-04-13 DIAGNOSIS — M1611 Unilateral primary osteoarthritis, right hip: Secondary | ICD-10-CM | POA: Diagnosis not present

## 2020-04-14 ENCOUNTER — Other Ambulatory Visit: Payer: Self-pay

## 2020-04-14 ENCOUNTER — Other Ambulatory Visit (INDEPENDENT_AMBULATORY_CARE_PROVIDER_SITE_OTHER): Payer: Self-pay

## 2020-04-14 DIAGNOSIS — Z0289 Encounter for other administrative examinations: Secondary | ICD-10-CM

## 2020-04-14 DIAGNOSIS — G40909 Epilepsy, unspecified, not intractable, without status epilepticus: Secondary | ICD-10-CM | POA: Diagnosis not present

## 2020-04-15 ENCOUNTER — Telehealth: Payer: Self-pay | Admitting: *Deleted

## 2020-04-15 LAB — CARBAMAZEPINE LEVEL, TOTAL: Carbamazepine (Tegretol), S: 6.3 ug/mL (ref 4.0–12.0)

## 2020-04-15 NOTE — Telephone Encounter (Signed)
-----   Message from [name], NP sent at 04/15/2020  8:49 AM EDT ----- Carbamazepine level is within normal range now.

## 2020-04-15 NOTE — Telephone Encounter (Signed)
Carbamazepine level is within normal range now.  Sent message thru [employer].

## 2020-04-23 DIAGNOSIS — M1611 Unilateral primary osteoarthritis, right hip: Secondary | ICD-10-CM | POA: Diagnosis not present

## 2020-04-29 ENCOUNTER — Encounter: Payer: Self-pay | Admitting: Internal Medicine

## 2020-05-04 DIAGNOSIS — Z96641 Presence of right artificial hip joint: Secondary | ICD-10-CM | POA: Diagnosis not present

## 2020-05-08 DIAGNOSIS — G4733 Obstructive sleep apnea (adult) (pediatric): Secondary | ICD-10-CM | POA: Diagnosis not present

## 2020-05-08 DIAGNOSIS — H6123 Impacted cerumen, bilateral: Secondary | ICD-10-CM | POA: Diagnosis not present

## 2020-05-22 DIAGNOSIS — G4733 Obstructive sleep apnea (adult) (pediatric): Secondary | ICD-10-CM | POA: Diagnosis not present

## 2020-06-02 ENCOUNTER — Other Ambulatory Visit: Payer: Self-pay | Admitting: Internal Medicine

## 2020-06-02 DIAGNOSIS — G4733 Obstructive sleep apnea (adult) (pediatric): Secondary | ICD-10-CM | POA: Diagnosis not present

## 2020-06-21 DIAGNOSIS — G4733 Obstructive sleep apnea (adult) (pediatric): Secondary | ICD-10-CM | POA: Diagnosis not present

## 2020-06-25 DIAGNOSIS — H25013 Cortical age-related cataract, bilateral: Secondary | ICD-10-CM | POA: Diagnosis not present

## 2020-06-30 ENCOUNTER — Encounter: Payer: BC Managed Care – PPO | Admitting: Internal Medicine

## 2020-07-15 ENCOUNTER — Encounter: Payer: Self-pay | Admitting: Internal Medicine

## 2020-07-15 ENCOUNTER — Other Ambulatory Visit (HOSPITAL_COMMUNITY)
Admission: RE | Admit: 2020-07-15 | Discharge: 2020-07-15 | Disposition: A | Discharge status: Home / Self Care | Payer: BC Managed Care – PPO | Source: Ambulatory Visit | Attending: Internal Medicine | Admitting: Internal Medicine

## 2020-07-15 ENCOUNTER — Ambulatory Visit (INDEPENDENT_AMBULATORY_CARE_PROVIDER_SITE_OTHER): Payer: BC Managed Care – PPO | Admitting: Internal Medicine

## 2020-07-15 ENCOUNTER — Other Ambulatory Visit: Payer: Self-pay

## 2020-07-15 VITALS — BP 124/82 | HR 86 | Temp 98.5°F | Resp 16 | Ht 68.0 in | Wt 235.0 lb

## 2020-07-15 DIAGNOSIS — Z124 Encounter for screening for malignant neoplasm of cervix: Secondary | ICD-10-CM

## 2020-07-15 DIAGNOSIS — R739 Hyperglycemia, unspecified: Secondary | ICD-10-CM

## 2020-07-15 DIAGNOSIS — G40909 Epilepsy, unspecified, not intractable, without status epilepticus: Secondary | ICD-10-CM

## 2020-07-15 DIAGNOSIS — F439 Reaction to severe stress, unspecified: Secondary | ICD-10-CM

## 2020-07-15 DIAGNOSIS — Z0000 Encounter for general adult medical examination without abnormal findings: Secondary | ICD-10-CM

## 2020-07-15 DIAGNOSIS — E7800 Pure hypercholesterolemia, unspecified: Secondary | ICD-10-CM

## 2020-07-15 DIAGNOSIS — G4733 Obstructive sleep apnea (adult) (pediatric): Secondary | ICD-10-CM

## 2020-07-15 DIAGNOSIS — E039 Hypothyroidism, unspecified: Secondary | ICD-10-CM

## 2020-07-15 DIAGNOSIS — I10 Essential (primary) hypertension: Secondary | ICD-10-CM

## 2020-07-15 NOTE — Assessment & Plan Note (Addendum)
Physical today 07/15/20.  PAP 06/20/19- negative with negative HPV, benign repair changes noted.  repeat PAP today 07/15/20.  Colonoscopy 09/2016 - recommended f/u colonoscopy in 5 years.   Mammogram 07/05/19 - Birads II.  She will schedule her mammogram.

## 2020-07-15 NOTE — Progress Notes (Signed)
Patient ID: ROBBI SPELLS, female   DOB: Nov 29, 1960, 59 y.o.   MRN: [id number]   Subjective:    Patient ID: [name], female    DOB: 12-02-1960, 59 y.o.   MRN: [id number]  HPI This visit occurred during the SARS-CoV-2 public health emergency.  Safety protocols were in place, including screening questions prior to the visit, additional usage of staff PPE, and extensive cleaning of exam room while observing appropriate contact time as indicated for disinfecting solutions.  Patient here for her physical exam.  She reports she is doing well.  Is s/p right THR 04/23/20.  Hip is doing well.  No pain.  Off gabapentin.  Trying to stay active.  Discussed diet and exercise.  No chest pain or sob.  No acid reflux reported.  No abdominal pain.  Bowels moving.  Discussed due mammogram.  She will schedule.    Past Medical History:  Diagnosis Date  . Arthritis   . 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  . Hypercholesterolemia   . Hypertension   . Hypothyroidism   . 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 Sleep apnea    USES CPAP  . Ulcer    Gastric   Past Surgical History:  Procedure Laterality Date  . BREAST BIOPSY Right 06/28/2017   Stereo affirm- UOQ/DUCTAL CARCINOMA IN SITU, HIGH NUCLEAR GRADE WITH COMEDONECROSIS   . BREAST EXCISIONAL BIOPSY Right 07/17/2017   WIde excision upper outer quadrant DCIS  Dr. Bary Castilla  . BREAST LUMPECTOMY Right 07/17/2017   DCIS mammosite  . CESAREAN SECTION    . COLONOSCOPY N/A 10/05/2016   Procedure: COLONOSCOPY;  Surgeon: Manya Silvas, MD;  Location: [facility] ENDOSCOPY;  Service: Endoscopy;  Laterality: N/A;  Zosyn IVPB  . JOINT REPLACEMENT    . MASTECTOMY, PARTIAL Right 07/17/2017   Procedure: MASTECTOMY PARTIAL;  Surgeon: Robert Bellow, MD;  Location: [facility] ORS;  Service: General;  Laterality: Right;  . thumb surgery Right 2005   AND CTR  . THYROIDECTOMY  1988  . TOTAL KNEE  ARTHROPLASTY Left 2012   Family History  Problem Relation Age of Onset  . Diabetes Father   . Dementia Mother   . Breast cancer Neg Hx   . Colon cancer Neg Hx    Social History   Socioeconomic History  . Marital status: Married    Spouse name: Not on file  . Number of children: 1  . Years of education: Not on file  . Highest education level: Not on file  Occupational History    Employer: [employer]  Tobacco Use  . Smoking status: Former Smoker    Packs/day: 0.25    Years: 2.00    Pack years: 0.50    Types: Cigarettes    Quit date: 08/01/2017    Years since quitting: 2.9  . Smokeless tobacco: Never Used  . Tobacco comment: PT ONLY SMOKES SOCIALLY-SHE MAY GO A FEW MONTHS WITHOUT EVER SMOKING   Vaping Use  . Vaping Use: Never used  Substance and Sexual Activity  . Alcohol use: Yes    Alcohol/week: 0.0 standard drinks    Comment: Socially  . Drug use: No  . Sexual activity: Yes  Other Topics Concern  . Not on file  Social History Narrative  . Not on file   Social Determinants of Health   Financial Resource Strain:   . Difficulty of Paying Living Expenses: Not  on file  Food Insecurity:   . Worried About Charity fundraiser in the Last Year: Not on file  . Ran Out of Food in the Last Year: Not on file  Transportation Needs:   . Lack of Transportation (Medical): Not on file  . Lack of Transportation (Non-Medical): Not on file  Physical Activity:   . Days of Exercise per Week: Not on file  . Minutes of Exercise per Session: Not on file  Stress:   . Feeling of Stress : Not on file  Social Connections:   . Frequency of Communication with Friends and Family: Not on file  . Frequency of Social Gatherings with Friends and Family: Not on file  . Attends Religious Services: Not on file  . Active Member of Clubs or Organizations: Not on file  . Attends Archivist Meetings: Not on file  . Marital Status: Not on file     Review of Systems    Constitutional: Negative for appetite change and unexpected weight change.  HENT: Negative for congestion and sinus pressure.   Eyes: Negative for pain and visual disturbance.  Respiratory: Negative for cough, chest tightness and shortness of breath.   Cardiovascular: Negative for chest pain, palpitations and leg swelling.  Gastrointestinal: Negative for abdominal pain, diarrhea, nausea and vomiting.  Genitourinary: Negative for difficulty urinating and dysuria.  Musculoskeletal: Negative for joint swelling and myalgias.  Skin: Negative for color change and rash.  Neurological: Negative for dizziness, light-headedness and headaches.  Hematological: Negative for adenopathy. Does not bruise/bleed easily.  Psychiatric/Behavioral: Negative for agitation and dysphoric mood.       Objective:    Physical Exam Vitals reviewed.  Constitutional:      General: She is not in acute distress.    Appearance: Normal appearance. She is well-developed.  HENT:     Head: Normocephalic and atraumatic.  Eyes:     General: No scleral icterus.       Right eye: No discharge.        Left eye: No discharge.     Conjunctiva/sclera: Conjunctivae normal.  Neck:     Thyroid: No thyromegaly.  Cardiovascular:     Rate and Rhythm: Normal rate and regular rhythm.  Pulmonary:     Effort: No tachypnea, accessory muscle usage or respiratory distress.     Breath sounds: Normal breath sounds. No decreased breath sounds or wheezing.  Chest:     Breasts:        Right: No inverted nipple, mass, nipple discharge or tenderness (no axillary adenopathy).        Left: No inverted nipple, mass, nipple discharge or tenderness (no axilarry adenopathy).  Abdominal:     General: Bowel sounds are normal.     Palpations: Abdomen is soft.     Tenderness: There is no abdominal tenderness.  Genitourinary:    Comments: Normal external genitalia.  Vaginal vault without lesions.  Cervix identified.  Pap smear performed.  Some  atrophy changes.  Some cervical stenosis. Could not appreciate any adnexal masses or tenderness.   Musculoskeletal:        General: No swelling or tenderness.     Cervical back: Neck supple. No tenderness.  Lymphadenopathy:     Cervical: No cervical adenopathy.  Skin:    Findings: No erythema or rash.  Neurological:     Mental Status: She is alert and oriented to person, place, and time.  Psychiatric:        Mood and Affect: Mood  normal.        Behavior: Behavior normal.     BP 124/82   Pulse 86   Temp 98.5 F (36.9 C)   Resp 16   Ht 5\' 8"  (1.727 m)   Wt 235 lb (106.6 kg)   LMP 10/09/2012   SpO2 97%   BMI 35.73 kg/m  Wt Readings from Last 3 Encounters:  07/15/20 235 lb (106.6 kg)  04/03/20 232 lb (105.2 kg)  02/20/20 234 lb 9.6 oz (106.4 kg)     Lab Results  Component Value Date   WBC 4.6 04/03/2020   HGB 13.1 04/03/2020   HCT 38.3 04/03/2020   PLT 222.0 04/03/2020   GLUCOSE 106 (H) 04/03/2020   CHOL 198 04/03/2020   TRIG 146.0 04/03/2020   HDL 54.80 04/03/2020   LDLDIRECT 112.0 06/04/2015   LDLCALC 114 (H) 04/03/2020   ALT 21 04/03/2020   AST 15 04/03/2020   NA 139 04/03/2020   K 4.0 04/03/2020   CL 106 04/03/2020   CREATININE 0.77 04/03/2020   BUN 15 04/03/2020   CO2 28 04/03/2020   TSH 1.93 07/04/2019   INR 1.0 04/03/2020   HGBA1C 5.1 04/03/2020    MR LUMBAR SPINE WO CONTRAST  Result Date: 08/09/2019 CLINICAL DATA:  Right leg pain since March 2020.  No known injury. EXAM: MRI LUMBAR SPINE WITHOUT CONTRAST TECHNIQUE: Multiplanar, multisequence MR imaging of the lumbar spine was performed. No intravenous contrast was administered. COMPARISON:  None. FINDINGS: Segmentation:  Standard. Alignment:  3 mm retrolisthesis of L3 on L4. Vertebrae:  No fracture, evidence of discitis, or bone lesion. Conus medullaris and cauda equina: Conus extends to the L1 level. Conus and cauda equina appear normal. Paraspinal and other soft tissues: No acute paraspinal  abnormality. Disc levels: Disc spaces: Degenerative disease with disc height loss throughout the lumbar spine. T10-11 and T11-12: Mild broad-based disc bulge. No central canal stenosis. T12-L1: No significant disc bulge. No evidence of neural foraminal stenosis. No central canal stenosis. L1-L2: Broad-based disc bulge. No evidence of neural foraminal stenosis. No central canal stenosis. L2-L3: Broad-based disc bulge flattening the ventral thecal sac. Mild bilateral facet arthropathy. Mild-moderate spinal stenosis. No foraminal stenosis. L3-L4: Broad-based disc osteophyte complex. Moderate bilateral facet arthropathy. Severe spinal stenosis no evidence of neural foraminal stenosis. L4-L5: Broad-based disc bulge. Moderate bilateral facet arthropathy. Moderate left foraminal stenosis. No right foraminal stenosis. Moderate spinal stenosis. L5-S1: Broad-based disc bulge with a small left paracentral disc protrusion. Mild bilateral facet arthropathy. Mild bilateral foraminal stenosis. No central canal stenosis. IMPRESSION: 1. Diffuse lumbar spine spondylosis as described above. 2.  No acute osseous injury of the lumbar spine. Electronically Signed   By: Kathreen Devoid   On: 08/09/2019 11:34       Assessment & Plan:   Problem List Items Addressed This Visit    Stress    Was followed by Dr Nicolasa Ducking.  On cymbalta and zoloft.  Follow.  Stable.       Seizure disorder ([place])    Followed by neurology.  On carbatrol.        Relevant Orders   Carbamazepine level, total   CBC with Differential/Platelet   Obstructive sleep apnea    CPAP.       Hypothyroidism    On thyroid replacement.  Follow tsh.       Hypercholesterolemia    On pravastatin.  Low cholesterol diet and exercise.  Follow lipid panel and liver function tests.  Relevant Orders   Hepatic function panel   Lipid panel   Health care maintenance    Physical today 07/15/20.  PAP 06/20/19- negative with negative HPV, benign repair changes noted.   repeat PAP today 07/15/20.  Colonoscopy 09/2016 - recommended f/u colonoscopy in 5 years.   Mammogram 07/05/19 - Birads II.  She will schedule her mammogram.        Essential hypertension, benign    Blood pressure as outlined.  Continue on diovan and amlodipine.  Follow pressures. Follow metabolic panel.       Relevant Orders   TSH   Basic metabolic panel    Other Visit Diagnoses    Routine general medical examination at a health care facility    -  Primary   Cervical cancer screening       Relevant Orders   Cytology - PAP( [place]) (Completed)   Hyperglycemia       Relevant Orders   Hemoglobin A1c       Einar Pheasant, MD

## 2020-07-16 LAB — CYTOLOGY - PAP
Comment: NEGATIVE
Diagnosis: NEGATIVE
High risk HPV: NEGATIVE

## 2020-07-20 ENCOUNTER — Encounter: Payer: Self-pay | Admitting: Internal Medicine

## 2020-07-20 NOTE — Assessment & Plan Note (Signed)
CPAP.  

## 2020-07-20 NOTE — Assessment & Plan Note (Signed)
On pravastatin.  Low cholesterol diet and exercise.  Follow lipid panel and liver function tests.   

## 2020-07-20 NOTE — Assessment & Plan Note (Signed)
On thyroid replacement.  Follow tsh.  

## 2020-07-20 NOTE — Assessment & Plan Note (Signed)
Was followed by Dr Nicolasa Ducking.  On cymbalta and zoloft.  Follow.  Stable.

## 2020-07-20 NOTE — Assessment & Plan Note (Signed)
Followed by neurology.  On carbatrol.

## 2020-07-20 NOTE — Assessment & Plan Note (Signed)
Blood pressure as outlined.  Continue on diovan and amlodipine.  Follow pressures. Follow metabolic panel.

## 2020-07-22 DIAGNOSIS — G4733 Obstructive sleep apnea (adult) (pediatric): Secondary | ICD-10-CM | POA: Diagnosis not present

## 2020-07-23 ENCOUNTER — Other Ambulatory Visit (INDEPENDENT_AMBULATORY_CARE_PROVIDER_SITE_OTHER): Payer: BC Managed Care – PPO

## 2020-07-23 ENCOUNTER — Other Ambulatory Visit: Payer: Self-pay

## 2020-07-23 DIAGNOSIS — I10 Essential (primary) hypertension: Secondary | ICD-10-CM

## 2020-07-23 DIAGNOSIS — R739 Hyperglycemia, unspecified: Secondary | ICD-10-CM | POA: Diagnosis not present

## 2020-07-23 DIAGNOSIS — E7800 Pure hypercholesterolemia, unspecified: Secondary | ICD-10-CM | POA: Diagnosis not present

## 2020-07-23 DIAGNOSIS — G40909 Epilepsy, unspecified, not intractable, without status epilepticus: Secondary | ICD-10-CM | POA: Diagnosis not present

## 2020-07-23 LAB — BASIC METABOLIC PANEL
BUN: 15 mg/dL (ref 6–23)
CO2: 31 mEq/L (ref 19–32)
Calcium: 9.2 mg/dL (ref 8.4–10.5)
Chloride: 101 mEq/L (ref 96–112)
Creatinine, Ser: 0.97 mg/dL (ref 0.40–1.20)
GFR: 58.89 mL/min — ABNORMAL LOW (ref >60.00)
Glucose, Bld: 94 mg/dL (ref 70–99)
Potassium: 3.4 mEq/L — ABNORMAL LOW (ref 3.5–5.1)
Sodium: 141 mEq/L (ref 135–145)

## 2020-07-23 LAB — CBC WITH DIFFERENTIAL/PLATELET
Basophils Absolute: 0.1 10*3/uL (ref 0.0–0.1)
Basophils Relative: 1.4 % (ref 0.0–3.0)
Eosinophils Absolute: 0.1 10*3/uL (ref 0.0–0.7)
Eosinophils Relative: 2.6 % (ref 0.0–5.0)
HCT: 40.8 % (ref 36.0–46.0)
Hemoglobin: 13.8 g/dL (ref 12.0–15.0)
Lymphocytes Relative: 40.9 % (ref 12.0–46.0)
Lymphs Abs: 2.3 10*3/uL (ref 0.7–4.0)
MCHC: 33.8 g/dL (ref 30.0–36.0)
MCV: 90.1 fl (ref 78.0–100.0)
Monocytes Absolute: 0.3 10*3/uL (ref 0.1–1.0)
Monocytes Relative: 5 % (ref 3.0–12.0)
Neutro Abs: 2.8 10*3/uL (ref 1.4–7.7)
Neutrophils Relative %: 50.1 % (ref 43.0–77.0)
Platelets: 226 10*3/uL (ref 150.0–400.0)
RBC: 4.53 Mil/uL (ref 3.87–5.11)
RDW: 13.6 % (ref 11.5–15.5)
WBC: 5.5 10*3/uL (ref 4.0–10.5)

## 2020-07-23 LAB — HEPATIC FUNCTION PANEL
ALT: 26 U/L (ref 0–35)
AST: 22 U/L (ref 0–37)
Albumin: 4.5 g/dL (ref 3.5–5.2)
Alkaline Phosphatase: 67 U/L (ref 39–117)
Bilirubin, Direct: 0.1 mg/dL (ref 0.0–0.3)
Total Bilirubin: 0.4 mg/dL (ref 0.2–1.2)
Total Protein: 7.2 g/dL (ref 6.0–8.3)

## 2020-07-23 LAB — LDL CHOLESTEROL, DIRECT: Direct LDL: 160 mg/dL

## 2020-07-23 LAB — LIPID PANEL
Cholesterol: 264 mg/dL — ABNORMAL HIGH (ref 0–200)
HDL: 68.4 mg/dL (ref >39.00)
NonHDL: 195.99
Total CHOL/HDL Ratio: 4
Triglycerides: 202 mg/dL — ABNORMAL HIGH (ref 0.0–149.0)
VLDL: 40.4 mg/dL — ABNORMAL HIGH (ref 0.0–40.0)

## 2020-07-23 LAB — HEMOGLOBIN A1C: Hgb A1c MFr Bld: 5.6 % (ref 4.6–6.5)

## 2020-07-23 LAB — TSH: TSH: 108.82 u[IU]/mL — ABNORMAL HIGH (ref 0.35–4.50)

## 2020-07-24 LAB — CARBAMAZEPINE LEVEL, TOTAL: Carbamazepine Lvl: 5.3 mg/L (ref 4.0–12.0)

## 2020-07-25 ENCOUNTER — Other Ambulatory Visit: Payer: Self-pay | Admitting: Internal Medicine

## 2020-07-25 DIAGNOSIS — E876 Hypokalemia: Secondary | ICD-10-CM

## 2020-07-25 NOTE — Progress Notes (Signed)
Order placed for f/u lab.   

## 2020-07-27 ENCOUNTER — Encounter: Payer: Self-pay | Admitting: Internal Medicine

## 2020-07-27 ENCOUNTER — Other Ambulatory Visit: Payer: Self-pay

## 2020-07-27 MED ORDER — ROSUVASTATIN CALCIUM 10 MG PO TABS: 10.0000 mg | ORAL_TABLET | Freq: Every day | ORAL | 1 refills | Status: DC

## 2020-07-27 NOTE — Telephone Encounter (Signed)
DISCUSSED RESULTS WITH PATIENT

## 2020-07-29 ENCOUNTER — Other Ambulatory Visit: Payer: Self-pay | Admitting: Internal Medicine

## 2020-08-04 ENCOUNTER — Other Ambulatory Visit: Payer: Self-pay | Admitting: Internal Medicine

## 2020-08-04 DIAGNOSIS — Z853 Personal history of malignant neoplasm of breast: Secondary | ICD-10-CM

## 2020-08-07 ENCOUNTER — Other Ambulatory Visit: Payer: Self-pay

## 2020-08-07 ENCOUNTER — Other Ambulatory Visit (INDEPENDENT_AMBULATORY_CARE_PROVIDER_SITE_OTHER): Payer: BC Managed Care – PPO

## 2020-08-07 DIAGNOSIS — E876 Hypokalemia: Secondary | ICD-10-CM

## 2020-08-07 LAB — POTASSIUM: Potassium: 3.9 meq/L (ref 3.5–5.1)

## 2020-08-08 ENCOUNTER — Other Ambulatory Visit: Payer: Self-pay | Admitting: Internal Medicine

## 2020-08-08 DIAGNOSIS — E039 Hypothyroidism, unspecified: Secondary | ICD-10-CM

## 2020-08-08 DIAGNOSIS — E7800 Pure hypercholesterolemia, unspecified: Secondary | ICD-10-CM

## 2020-08-08 NOTE — Progress Notes (Signed)
Order placed for f/u labs.  

## 2020-08-18 ENCOUNTER — Encounter: Payer: Self-pay | Admitting: Internal Medicine

## 2020-08-22 DIAGNOSIS — G4733 Obstructive sleep apnea (adult) (pediatric): Secondary | ICD-10-CM | POA: Diagnosis not present

## 2020-08-25 ENCOUNTER — Other Ambulatory Visit: Payer: BC Managed Care – PPO

## 2020-08-25 ENCOUNTER — Ambulatory Visit
Admission: RE | Admit: 2020-08-25 | Discharge: 2020-08-25 | Disposition: A | Discharge status: Home / Self Care | Payer: BC Managed Care – PPO | Source: Ambulatory Visit | Attending: Internal Medicine | Admitting: Internal Medicine

## 2020-08-25 ENCOUNTER — Other Ambulatory Visit: Payer: Self-pay

## 2020-08-25 DIAGNOSIS — G4733 Obstructive sleep apnea (adult) (pediatric): Secondary | ICD-10-CM | POA: Diagnosis not present

## 2020-08-25 DIAGNOSIS — Z853 Personal history of malignant neoplasm of breast: Secondary | ICD-10-CM | POA: Insufficient documentation

## 2020-08-25 DIAGNOSIS — R928 Other abnormal and inconclusive findings on diagnostic imaging of breast: Secondary | ICD-10-CM | POA: Diagnosis not present

## 2020-09-02 ENCOUNTER — Other Ambulatory Visit: Payer: Self-pay | Admitting: Internal Medicine

## 2020-09-02 MED ORDER — PRAVASTATIN SODIUM 20 MG PO TABS: 20.0000 mg | ORAL_TABLET | Freq: Every day | ORAL | 3 refills | Status: DC

## 2020-09-02 NOTE — Telephone Encounter (Signed)
rx sent in for pravastatin.

## 2020-09-11 DIAGNOSIS — Z01818 Encounter for other preprocedural examination: Secondary | ICD-10-CM | POA: Diagnosis not present

## 2020-09-11 DIAGNOSIS — H43813 Vitreous degeneration, bilateral: Secondary | ICD-10-CM | POA: Diagnosis not present

## 2020-10-02 DIAGNOSIS — H2511 Age-related nuclear cataract, right eye: Secondary | ICD-10-CM | POA: Diagnosis not present

## 2020-10-12 ENCOUNTER — Other Ambulatory Visit: Payer: Self-pay | Admitting: Internal Medicine

## 2020-11-03 ENCOUNTER — Other Ambulatory Visit: Payer: Self-pay | Admitting: General Surgery

## 2020-11-03 DIAGNOSIS — D0511 Intraductal carcinoma in situ of right breast: Secondary | ICD-10-CM

## 2020-11-03 NOTE — Progress Notes (Signed)
a 

## 2020-12-02 ENCOUNTER — Ambulatory Visit: Payer: BC Managed Care – PPO | Admitting: Internal Medicine

## 2020-12-04 ENCOUNTER — Other Ambulatory Visit: Payer: Self-pay | Admitting: Internal Medicine

## 2020-12-06 ENCOUNTER — Other Ambulatory Visit: Payer: Self-pay | Admitting: Internal Medicine

## 2020-12-30 ENCOUNTER — Ambulatory Visit: Payer: BC Managed Care – PPO | Admitting: Internal Medicine

## 2020-12-30 ENCOUNTER — Encounter: Payer: Self-pay | Admitting: Internal Medicine

## 2020-12-30 ENCOUNTER — Other Ambulatory Visit: Payer: Self-pay

## 2020-12-30 VITALS — BP 122/70 | HR 86 | Temp 98.1°F | Resp 16 | Ht 68.0 in | Wt 239.0 lb

## 2020-12-30 DIAGNOSIS — E7800 Pure hypercholesterolemia, unspecified: Secondary | ICD-10-CM | POA: Diagnosis not present

## 2020-12-30 DIAGNOSIS — Z114 Encounter for screening for human immunodeficiency virus [HIV]: Secondary | ICD-10-CM | POA: Diagnosis not present

## 2020-12-30 DIAGNOSIS — R739 Hyperglycemia, unspecified: Secondary | ICD-10-CM | POA: Diagnosis not present

## 2020-12-30 DIAGNOSIS — G4733 Obstructive sleep apnea (adult) (pediatric): Secondary | ICD-10-CM

## 2020-12-30 DIAGNOSIS — D0511 Intraductal carcinoma in situ of right breast: Secondary | ICD-10-CM

## 2020-12-30 DIAGNOSIS — Z1159 Encounter for screening for other viral diseases: Secondary | ICD-10-CM

## 2020-12-30 DIAGNOSIS — G40909 Epilepsy, unspecified, not intractable, without status epilepticus: Secondary | ICD-10-CM | POA: Diagnosis not present

## 2020-12-30 DIAGNOSIS — E039 Hypothyroidism, unspecified: Secondary | ICD-10-CM

## 2020-12-30 DIAGNOSIS — F439 Reaction to severe stress, unspecified: Secondary | ICD-10-CM

## 2020-12-30 DIAGNOSIS — I10 Essential (primary) hypertension: Secondary | ICD-10-CM

## 2020-12-30 LAB — HEPATIC FUNCTION PANEL
ALT: 19 U/L (ref 0–35)
AST: 15 U/L (ref 0–37)
Albumin: 4.5 g/dL (ref 3.5–5.2)
Alkaline Phosphatase: 63 U/L (ref 39–117)
Bilirubin, Direct: 0.1 mg/dL (ref 0.0–0.3)
Total Bilirubin: 0.5 mg/dL (ref 0.2–1.2)
Total Protein: 6.9 g/dL (ref 6.0–8.3)

## 2020-12-30 LAB — LIPID PANEL
Cholesterol: 186 mg/dL (ref 0–200)
HDL: 60.2 mg/dL (ref >39.00)
LDL Cholesterol: 99 mg/dL (ref 0–99)
NonHDL: 125.44
Total CHOL/HDL Ratio: 3
Triglycerides: 134 mg/dL (ref 0.0–149.0)
VLDL: 26.8 mg/dL (ref 0.0–40.0)

## 2020-12-30 LAB — BASIC METABOLIC PANEL
BUN: 20 mg/dL (ref 6–23)
CO2: 27 mEq/L (ref 19–32)
Calcium: 9.4 mg/dL (ref 8.4–10.5)
Chloride: 105 mEq/L (ref 96–112)
Creatinine, Ser: 0.83 mg/dL (ref 0.40–1.20)
GFR: 77.44 mL/min (ref >60.00)
Glucose, Bld: 97 mg/dL (ref 70–99)
Potassium: 4.1 mEq/L (ref 3.5–5.1)
Sodium: 140 mEq/L (ref 135–145)

## 2020-12-30 LAB — TSH: TSH: 0.77 u[IU]/mL (ref 0.35–4.50)

## 2020-12-30 LAB — HEMOGLOBIN A1C: Hgb A1c MFr Bld: 5.4 % (ref 4.6–6.5)

## 2020-12-30 NOTE — Progress Notes (Signed)
Patient ID: Jennifer Pratt, female   DOB: [birth year], 60 y.o.   MRN: [id number]   Subjective:    Patient ID: Jennifer Pratt, female    DOB: 1961/02/20, 60 y.o.   MRN: [id number]  HPI This visit occurred during the SARS-CoV-2 public health emergency.  Safety protocols were in place, including screening questions prior to the visit, additional usage of staff PPE, and extensive cleaning of exam room while observing appropriate contact time as indicated for disinfecting solutions.  Patient here for a scheduled follow up. Here to follow up regarding stress, sleep apnea and cholesterol.  Reports she is doing well. Feels good.  Trying to stay active.  No chest pain or sob with increased activity or exertion.  No nausea or vomiting.  Bowels moving.  Doing well s/p THR.  No significant pain.  Off gabapentin.  Riding stationary bike. Watching her diet. Has lost some weight - weighs at home (242 down to 235).     Past Medical History:  Diagnosis Date  . Arthritis   . 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  . Hypercholesterolemia   . Hypertension   . Hypothyroidism   . 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 Sleep apnea    USES CPAP  . Ulcer    Gastric   Past Surgical History:  Procedure Laterality Date  . BREAST BIOPSY Right 06/28/2017   Stereo affirm- UOQ/DUCTAL CARCINOMA IN SITU, HIGH NUCLEAR GRADE WITH COMEDONECROSIS   . BREAST EXCISIONAL BIOPSY Right 07/17/2017   WIde excision upper outer quadrant DCIS  Dr. Bary Castilla  . BREAST LUMPECTOMY Right 07/17/2017   DCIS mammosite  . CESAREAN SECTION    . COLONOSCOPY N/A 10/05/2016   Procedure: COLONOSCOPY;  Surgeon: Manya Silvas, MD;  Location: [facility] ENDOSCOPY;  Service: Endoscopy;  Laterality: N/A;  Zosyn IVPB  . JOINT REPLACEMENT    . MASTECTOMY, PARTIAL Right 07/17/2017   Procedure: MASTECTOMY PARTIAL;  Surgeon: Robert Bellow, MD;  Location: [facility] ORS;  Service: General;   Laterality: Right;  . thumb surgery Right 2005   AND CTR  . THYROIDECTOMY  1988  . TOTAL KNEE ARTHROPLASTY Left 2012   Family History  Problem Relation Age of Onset  . Diabetes Father   . Dementia Mother   . Breast cancer Neg Hx   . Colon cancer Neg Hx    Social History   Socioeconomic History  . Marital status: Married    Spouse name: Not on file  . Number of children: 1  . Years of education: Not on file  . Highest education level: Not on file  Occupational History    Employer: [employer]  Tobacco Use  . Smoking status: Former Smoker    Packs/day: 0.25    Years: 2.00    Pack years: 0.50    Types: Cigarettes    Quit date: 08/01/2017    Years since quitting: 3.4  . Smokeless tobacco: Never Used  . Tobacco comment: PT ONLY SMOKES SOCIALLY-SHE MAY GO A FEW MONTHS WITHOUT EVER SMOKING   Vaping Use  . Vaping Use: Never used  Substance and Sexual Activity  . Alcohol use: Yes    Alcohol/week: 0.0 standard drinks    Comment: Socially  . Drug use: No  . Sexual activity: Yes  Other Topics Concern  . Not on file  Social History Narrative  . Not on file   Social Determinants of  Health   Financial Resource Strain: Not on file  Food Insecurity: Not on file  Transportation Needs: Not on file  Physical Activity: Not on file  Stress: Not on file  Social Connections: Not on file    Outpatient Encounter Medications as of 12/30/2020  Medication Sig  . amLODipine (NORVASC) 5 MG tablet TAKE 1 TABLET BY MOUTH EVERY DAY  . carbamazepine (CARBATROL) 200 MG 12 hr capsule TAKE 1 CAPSULE BY MOUTH TWICE A DAY  . diclofenac (VOLTAREN) 75 MG EC tablet Take 1 tablet (75 mg total) by mouth 2 (two) times daily.  . DULoxetine (CYMBALTA) 60 MG capsule TAKE 1 CAPSULE BY MOUTH EVERY DAY  . fluticasone (FLONASE) 50 MCG/ACT nasal spray Place 1 spray into both nostrils daily as needed for allergies or rhinitis.  Marland Kitchen levothyroxine (SYNTHROID) 175 MCG tablet TAKE 1 TABLET BY MOUTH EVERY DAY  .  pravastatin (PRAVACHOL) 20 MG tablet TAKE 1 TABLET BY MOUTH EVERY DAY  . raloxifene (EVISTA) 60 MG tablet TAKE 1 TABLET BY MOUTH EVERY DAY  . sertraline (ZOLOFT) 50 MG tablet TAKE 1 TABLET BY MOUTH EVERY DAY  . valsartan (DIOVAN) 160 MG tablet TAKE 1 TABLET BY MOUTH EVERY DAY  . [DISCONTINUED] gabapentin (NEURONTIN) 300 MG capsule Take 600 mg by mouth at bedtime.  . [DISCONTINUED] KLOR-CON 10 10 MEQ tablet TAKE 1 TABLET (10 MEQ TOTAL) BY MOUTH 2 (TWO) TIMES DAILY.   No facility-administered encounter medications on file as of 12/30/2020.    Review of Systems  Constitutional: Negative for appetite change and unexpected weight change.  HENT: Negative for congestion and sinus pressure.   Respiratory: Negative for cough, chest tightness and shortness of breath.   Cardiovascular: Negative for chest pain, palpitations and leg swelling.  Gastrointestinal: Negative for diarrhea, nausea and vomiting.  Genitourinary: Negative for difficulty urinating and dysuria.  Musculoskeletal: Negative for joint swelling and myalgias.  Skin: Negative for color change and rash.  Neurological: Negative for dizziness, light-headedness and headaches.  Psychiatric/Behavioral: Negative for agitation and dysphoric mood.       Objective:    Physical Exam Vitals reviewed.  Constitutional:      General: She is not in acute distress.    Appearance: Normal appearance.  HENT:     Head: Normocephalic and atraumatic.     Right Ear: External ear normal.     Left Ear: External ear normal.     Mouth/Throat:     Mouth: Oropharynx is clear and moist.  Eyes:     General: No scleral icterus.       Right eye: No discharge.        Left eye: No discharge.     Conjunctiva/sclera: Conjunctivae normal.  Neck:     Thyroid: No thyromegaly.  Cardiovascular:     Rate and Rhythm: Normal rate and regular rhythm.  Pulmonary:     Effort: No respiratory distress.     Breath sounds: Normal breath sounds. No wheezing.  Abdominal:      General: Bowel sounds are normal.     Palpations: Abdomen is soft.     Tenderness: There is no abdominal tenderness.  Musculoskeletal:        General: No swelling, tenderness or edema.     Cervical back: Neck supple. No tenderness.  Lymphadenopathy:     Cervical: No cervical adenopathy.  Skin:    Findings: No erythema or rash.  Neurological:     Mental Status: She is alert.  Psychiatric:  Mood and Affect: Mood normal.        Behavior: Behavior normal.     BP 122/70   Pulse 86   Temp 98.1 F (36.7 C) (Oral)   Resp 16   Ht 5\' 8"  (1.727 m)   Wt 239 lb (108.4 kg)   LMP 10/09/2012   SpO2 98%   BMI 36.34 kg/m  Wt Readings from Last 3 Encounters:  12/30/20 239 lb (108.4 kg)  07/15/20 235 lb (106.6 kg)  04/03/20 232 lb (105.2 kg)     Lab Results  Component Value Date   WBC 5.5 07/23/2020   HGB 13.8 07/23/2020   HCT 40.8 07/23/2020   PLT 226.0 07/23/2020   GLUCOSE 97 12/30/2020   CHOL 186 12/30/2020   TRIG 134.0 12/30/2020   HDL 60.20 12/30/2020   LDLDIRECT 160.0 07/23/2020   LDLCALC 99 12/30/2020   ALT 19 12/30/2020   AST 15 12/30/2020   NA 140 12/30/2020   K 4.1 12/30/2020   CL 105 12/30/2020   CREATININE 0.83 12/30/2020   BUN 20 12/30/2020   CO2 27 12/30/2020   TSH 0.77 12/30/2020   INR 1.0 04/03/2020   HGBA1C 5.4 12/30/2020    MM DIAG BREAST TOMO BILATERAL  Result Date: 08/25/2020 CLINICAL DATA:  Status post right lumpectomy and radiation therapy breast cancer in 2018. EXAM: DIGITAL DIAGNOSTIC BILATERAL MAMMOGRAM WITH TOMO AND CAD COMPARISON:  Previous exam(s). ACR Breast Density Category b: There are scattered areas of fibroglandular density. FINDINGS: Stable post lumpectomy changes on the right. No interval findings suspicious for malignancy in either breast. Mammographic images were processed with CAD. IMPRESSION: No evidence of malignancy. RECOMMENDATION: Bilateral diagnostic mammogram in 1 year. I have discussed the findings and  recommendations with the patient. If applicable, a reminder letter will be sent to the patient regarding the next appointment. BI-RADS CATEGORY  2: Benign. Electronically Signed   By: Claudie Revering M.D.   On: 08/25/2020 09:42       Assessment & Plan:   Problem List Items Addressed This Visit    Ductal carcinoma in situ (DCIS) of right breast    08/25/20 - Birads II. Has seen Dr Bary Castilla.       Essential hypertension, benign    Continue diovan and amlodipine.  Blood pressure doing well.  Follow pressures.  Follow metabolic panel.       Relevant Orders   Basic metabolic panel (Completed)   Hypercholesterolemia    On pravastatin.  Low cholesterol diet and exercise.  Follow lipid panel and liver function tests.        Relevant Orders   Lipid panel (Completed)   Hepatic function panel (Completed)   Hypothyroidism    On thyroid replacement.  Follow tsh.       Relevant Orders   TSH (Completed)   Obstructive sleep apnea    CPAP.       Seizure disorder ([place]) - Primary    On carbatrol.  Stable.  Followed by neurology.        Relevant Orders   Carbamazepine level, total (Completed)   Stress    On zoloft and cymbalta.  Stable. Follow.        Other Visit Diagnoses    Need for hepatitis C screening test       Relevant Orders   Hepatitis C antibody (Completed)   Hyperglycemia       Relevant Orders   Hemoglobin A1c (Completed)   Encounter for screening for HIV  Relevant Orders   HIV antibody (with reflex) (Completed)       Einar Pheasant, MD

## 2020-12-31 LAB — CARBAMAZEPINE LEVEL, TOTAL: Carbamazepine Lvl: 6.2 mg/L (ref 4.0–12.0)

## 2020-12-31 LAB — HIV ANTIBODY (ROUTINE TESTING W REFLEX): HIV 1&2 Ab, 4th Generation: NONREACTIVE

## 2020-12-31 LAB — HEPATITIS C ANTIBODY
Hepatitis C Ab: NONREACTIVE
SIGNAL TO CUT-OFF: 0 (ref <1.00)

## 2021-01-03 ENCOUNTER — Encounter: Payer: Self-pay | Admitting: Internal Medicine

## 2021-01-03 NOTE — Assessment & Plan Note (Signed)
On pravastatin.  Low cholesterol diet and exercise.  Follow lipid panel and liver function tests.   

## 2021-01-03 NOTE — Assessment & Plan Note (Signed)
On thyroid replacement.  Follow tsh.  

## 2021-01-03 NOTE — Assessment & Plan Note (Signed)
On carbatrol.  Stable.  Followed by neurology.

## 2021-01-03 NOTE — Assessment & Plan Note (Signed)
Continue diovan and amlodipine.  Blood pressure doing well.  Follow pressures.  Follow metabolic panel.

## 2021-01-03 NOTE — Assessment & Plan Note (Signed)
On zoloft and cymbalta.  Stable. Follow.

## 2021-01-03 NOTE — Assessment & Plan Note (Signed)
CPAP.  

## 2021-01-03 NOTE — Assessment & Plan Note (Signed)
08/25/20 - Birads II. Has seen Dr Bary Castilla.

## 2021-01-13 ENCOUNTER — Other Ambulatory Visit: Payer: Self-pay | Admitting: Internal Medicine

## 2021-01-13 ENCOUNTER — Ambulatory Visit: Payer: BC Managed Care – PPO | Admitting: Neurology

## 2021-01-13 ENCOUNTER — Encounter: Payer: Self-pay | Admitting: Neurology

## 2021-01-13 VITALS — BP 134/82 | HR 86 | Ht 69.0 in | Wt 240.0 lb

## 2021-01-13 DIAGNOSIS — G40909 Epilepsy, unspecified, not intractable, without status epilepticus: Secondary | ICD-10-CM | POA: Diagnosis not present

## 2021-01-13 MED ORDER — CARBAMAZEPINE ER 200 MG PO CP12: ORAL_CAPSULE | ORAL | 4 refills | Status: DC

## 2021-01-13 NOTE — Patient Instructions (Signed)
Continue current medications  I am glad you are doing well! See you back in 1 year

## 2021-01-13 NOTE — Progress Notes (Signed)
PATIENT: Jennifer Pratt DOB: [birth year]  REASON FOR VISIT: follow up HISTORY FROM: patient  HISTORY OF PRESENT ILLNESS: Today 01/13/21 Jennifer Pratt Is a 60 year old female with history of seizures, well-controlled on Carbatrol. Her seizures used to be associated with her menstrual cycle, she has not had a period in several years.  Continues to do well, she had a hip replacement on the right in May, did great.  She continues to work full-time, drives 80 miles round trip daily.  She rides her stationary bike every morning.  Has history of breast cancer, sees oncology annually.  Recently saw her PCP, routine labs, and carbamazepine level were reviewed.  Here today for evaluation unaccompanied.  HISTORY 01/13/2020 SS: Jennifer Pratt is a 60 year old female with history of seizures, well controlled on Carbatrol.  Her seizures used to be associated with her menstrual cycle, but she has not had a period in several years.  She had routine lab work in August 2020, carbamazepine level was 5.6, liver function was normal.  She has not had a seizure in about 20 years.  She is currently in physical therapy for leg pain, lumbar stenosis.  She is considering getting a neurosurgical opinion.  She works full-time, in [employer].  She drives about 80 miles back and forth to work.  She needs a refill medication.  She presents today for evaluation unaccompanied.   REVIEW OF SYSTEMS: Out of a complete 14 system review of symptoms, the patient complains only of the following symptoms, and all other reviewed systems are negative.  n/a  ALLERGIES: Allergies  Allergen Reactions  . Lisinopril Other (See Comments)    UNKNOWN     HOME MEDICATIONS: Outpatient Medications Prior to Visit  Medication Sig Dispense Refill  . amLODipine (NORVASC) 5 MG tablet TAKE 1 TABLET BY MOUTH EVERY DAY 90 tablet 1  . diclofenac (VOLTAREN) 75 MG EC tablet Take 1 tablet (75 mg total) by mouth 2 (two) times daily. 60 tablet 0  .  DULoxetine (CYMBALTA) 60 MG capsule TAKE 1 CAPSULE BY MOUTH EVERY DAY 90 capsule 1  . fluticasone (FLONASE) 50 MCG/ACT nasal spray Place 1 spray into both nostrils daily as needed for allergies or rhinitis.    . pravastatin (PRAVACHOL) 20 MG tablet TAKE 1 TABLET BY MOUTH EVERY DAY 90 tablet 1  . raloxifene (EVISTA) 60 MG tablet TAKE 1 TABLET BY MOUTH EVERY DAY 90 tablet 4  . sertraline (ZOLOFT) 50 MG tablet TAKE 1 TABLET BY MOUTH EVERY DAY 90 tablet 1  . valsartan (DIOVAN) 160 MG tablet TAKE 1 TABLET BY MOUTH EVERY DAY 90 tablet 2  . carbamazepine (CARBATROL) 200 MG 12 hr capsule TAKE 1 CAPSULE BY MOUTH TWICE A DAY 180 capsule 3  . levothyroxine (SYNTHROID) 175 MCG tablet TAKE 1 TABLET BY MOUTH EVERY DAY 90 tablet 2   No facility-administered medications prior to visit.    PAST MEDICAL HISTORY: Past Medical History:  Diagnosis Date  . Arthritis   . 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  . Hypercholesterolemia   . Hypertension   . Hypothyroidism   . 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 Sleep apnea    USES CPAP  . Ulcer    Gastric    PAST SURGICAL HISTORY: Past Surgical History:  Procedure Laterality Date  . BREAST BIOPSY Right 06/28/2017   Stereo affirm- UOQ/DUCTAL CARCINOMA IN SITU, HIGH NUCLEAR GRADE WITH COMEDONECROSIS   .  BREAST EXCISIONAL BIOPSY Right 07/17/2017   WIde excision upper outer quadrant DCIS  Dr. Bary Castilla  . BREAST LUMPECTOMY Right 07/17/2017   DCIS mammosite  . CESAREAN SECTION    . COLONOSCOPY N/A 10/05/2016   Procedure: COLONOSCOPY;  Surgeon: Manya Silvas, MD;  Location: [facility] END[facility]COPY;  Service: Endoscopy;  Laterality: N/A;  Zosyn IVPB  . JOINT REPLACEMENT    . MASTECTOMY, PARTIAL Right 07/17/2017   Procedure: MASTECTOMY PARTIAL;  Surgeon: Robert Bellow, MD;  Location: [facility] ORS;  Service: General;  Laterality: Right;  . thumb surgery Right 2005   AND CTR  . THYROIDECTOMY  1988   . TOTAL KNEE ARTHROPLASTY Left 2012    FAMILY HISTORY: Family History  Problem Relation Age of Onset  . Diabetes Father   . Dementia Mother   . Breast cancer Neg Hx   . Colon cancer Neg Hx     SOCIAL HISTORY: Social History   Socioeconomic History  . Marital status: Married    Spouse name: Not on file  . Number of children: 1  . Years of education: Not on file  . Highest education level: Not on file  Occupational History    Employer: [employer]  Tobacco Use  . Smoking status: Former Smoker    Packs/day: 0.25    Years: 2.00    Pack years: 0.50    Types: Cigarettes    Quit date: 08/01/2017    Years since quitting: 3.4  . Smokeless tobacco: Never Used  . Tobacco comment: PT ONLY SMOKES SOCIALLY-SHE MAY GO A FEW MONTHS WITHOUT EVER SMOKING   Vaping Use  . Vaping Use: Never used  Substance and Sexual Activity  . Alcohol use: Yes    Alcohol/week: 0.0 standard drinks    Comment: Socially  . Drug use: No  . Sexual activity: Yes  Other Topics Concern  . Not on file  Social History Narrative  . Not on file   Social Determinants of Health   Financial Resource Strain: Not on file  Food Insecurity: Not on file  Transportation Needs: Not on file  Physical Activity: Not on file  Stress: Not on file  Social Connections: Not on file  Intimate Partner Violence: Not on file   PHYSICAL EXAM  Vitals:   01/13/21 1115  BP: 134/82  Pulse: 86  Weight: 240 lb (108.9 kg)  Height: 5\' 9"  (1.753 m)   Body mass index is 35.44 kg/m.  Generalized: Well developed, in no acute distress   Neurological examination  Mentation: Alert oriented to time, place, history taking. Follows all commands speech and language fluent Cranial nerve II-XII: Pupils were equal round reactive to light. Extraocular movements were full, visual field were full on confrontational test. Facial sensation and strength were normal. Head turning and shoulder shrug  were normal and symmetric. Motor: The  motor testing reveals 5 over 5 strength of all 4 extremities. Good symmetric motor tone is noted throughout.  Sensory: Sensory testing is intact to soft touch on all 4 extremities. No evidence of extinction is noted.  Coordination: Cerebellar testing reveals good finger-nose-finger and heel-to-shin bilaterally.  Gait and station: Gait is normal. Tandem gait is normal. Romberg is negative. No drift is seen.  Reflexes: Deep tendon reflexes are symmetric and normal bilaterally.   DIAGNOSTIC DATA (LABS, IMAGING, TESTING) - I reviewed patient records, labs, notes, testing and imaging myself where available.  Lab Results  Component Value Date   WBC 5.5 07/23/2020   HGB 13.8 07/23/2020  HCT 40.8 07/23/2020   MCV 90.1 07/23/2020   PLT 226.0 07/23/2020      Component Value Date/Time   NA 140 12/30/2020 0839   K 4.1 12/30/2020 0839   CL 105 12/30/2020 0839   CO2 27 12/30/2020 0839   GLUCOSE 97 12/30/2020 0839   BUN 20 12/30/2020 0839   CREATININE 0.83 12/30/2020 0839   CALCIUM 9.4 12/30/2020 0839   PROT 6.9 12/30/2020 0839   ALBUMIN 4.5 12/30/2020 0839   AST 15 12/30/2020 0839   ALT 19 12/30/2020 0839   ALKPHOS 63 12/30/2020 0839   BILITOT 0.5 12/30/2020 0839   GFRNONAA >60 08/12/2011 0512   GFRAA >60 08/12/2011 0512   Lab Results  Component Value Date   CHOL 186 12/30/2020   HDL 60.20 12/30/2020   LDLCALC 99 12/30/2020   LDLDIRECT 160.0 07/23/2020   TRIG 134.0 12/30/2020   CHOLHDL 3 12/30/2020   Lab Results  Component Value Date   HGBA1C 5.4 12/30/2020   Lab Results  Component Value Date   VITAMINB12 >1500 (H) 06/05/2014   Lab Results  Component Value Date   TSH 0.77 12/30/2020    ASSESSMENT AND PLAN 60 y.o. year old female  has a past medical history of Arthritis, Breast cancer ([place]) (06/28/2017), Hypercholesterolemia, Hypertension, Hypothyroidism, Seizures ([place]), Sleep apnea, and Ulcer. here with:  1.  Seizures -Seizures remain under excellent  control -Continue Carbatrol 200 mg twice a day -Reviewed recent labs from PCP, carbamazepine was 6.2,  BMP, hepatic function panel were unremarkable -Call for seizure activity, otherwise follow-up 1 year or sooner if needed  I spent 20 minutes of face-to-face and non-face-to-face time with patient.  This included previsit chart review, lab review, study review, order entry, electronic health record documentation, patient education.  Butler Denmark, AGNP-C, DNP 01/13/2021, 11:40 AM [facility] [address], [place], [place] [address] [phone]

## 2021-01-13 NOTE — Progress Notes (Signed)
I have read the note, and I agree with the clinical assessment and plan.  Caralee Morea K Leticia Mcdiarmid   

## 2021-01-20 DIAGNOSIS — D0361 Melanoma in situ of right upper limb, including shoulder: Secondary | ICD-10-CM | POA: Diagnosis not present

## 2021-01-20 DIAGNOSIS — L821 Other seborrheic keratosis: Secondary | ICD-10-CM | POA: Diagnosis not present

## 2021-01-20 DIAGNOSIS — D229 Melanocytic nevi, unspecified: Secondary | ICD-10-CM | POA: Diagnosis not present

## 2021-01-20 DIAGNOSIS — L578 Other skin changes due to chronic exposure to nonionizing radiation: Secondary | ICD-10-CM | POA: Diagnosis not present

## 2021-01-20 DIAGNOSIS — D485 Neoplasm of uncertain behavior of skin: Secondary | ICD-10-CM | POA: Diagnosis not present

## 2021-01-20 DIAGNOSIS — C44619 Basal cell carcinoma of skin of left upper limb, including shoulder: Secondary | ICD-10-CM | POA: Diagnosis not present

## 2021-02-05 DIAGNOSIS — C4361 Malignant melanoma of right upper limb, including shoulder: Secondary | ICD-10-CM | POA: Diagnosis not present

## 2021-02-05 DIAGNOSIS — D0361 Melanoma in situ of right upper limb, including shoulder: Secondary | ICD-10-CM | POA: Diagnosis not present

## 2021-02-19 DIAGNOSIS — C44619 Basal cell carcinoma of skin of left upper limb, including shoulder: Secondary | ICD-10-CM | POA: Diagnosis not present

## 2021-04-08 ENCOUNTER — Other Ambulatory Visit: Payer: Self-pay | Admitting: Internal Medicine

## 2021-04-30 ENCOUNTER — Ambulatory Visit: Payer: BC Managed Care – PPO | Admitting: Internal Medicine

## 2021-05-18 ENCOUNTER — Other Ambulatory Visit: Payer: Self-pay | Admitting: Internal Medicine

## 2021-05-20 DIAGNOSIS — Z85820 Personal history of malignant melanoma of skin: Secondary | ICD-10-CM | POA: Diagnosis not present

## 2021-05-20 DIAGNOSIS — L578 Other skin changes due to chronic exposure to nonionizing radiation: Secondary | ICD-10-CM | POA: Diagnosis not present

## 2021-05-20 DIAGNOSIS — L821 Other seborrheic keratosis: Secondary | ICD-10-CM | POA: Diagnosis not present

## 2021-05-20 DIAGNOSIS — D225 Melanocytic nevi of trunk: Secondary | ICD-10-CM | POA: Diagnosis not present

## 2021-06-08 ENCOUNTER — Encounter: Payer: Self-pay | Admitting: Internal Medicine

## 2021-06-08 ENCOUNTER — Ambulatory Visit: Payer: BC Managed Care – PPO | Admitting: Internal Medicine

## 2021-06-08 ENCOUNTER — Other Ambulatory Visit: Payer: Self-pay

## 2021-06-08 DIAGNOSIS — G40909 Epilepsy, unspecified, not intractable, without status epilepticus: Secondary | ICD-10-CM | POA: Diagnosis not present

## 2021-06-08 DIAGNOSIS — E039 Hypothyroidism, unspecified: Secondary | ICD-10-CM

## 2021-06-08 DIAGNOSIS — I10 Essential (primary) hypertension: Secondary | ICD-10-CM | POA: Diagnosis not present

## 2021-06-08 DIAGNOSIS — G4733 Obstructive sleep apnea (adult) (pediatric): Secondary | ICD-10-CM

## 2021-06-08 DIAGNOSIS — E7800 Pure hypercholesterolemia, unspecified: Secondary | ICD-10-CM

## 2021-06-08 DIAGNOSIS — Z85820 Personal history of malignant melanoma of skin: Secondary | ICD-10-CM

## 2021-06-08 DIAGNOSIS — D0511 Intraductal carcinoma in situ of right breast: Secondary | ICD-10-CM

## 2021-06-08 DIAGNOSIS — F439 Reaction to severe stress, unspecified: Secondary | ICD-10-CM

## 2021-06-08 NOTE — Progress Notes (Signed)
Patient ID: Jennifer Pratt, female   DOB: [birth year], 60 y.o.   MRN: [id number]   Subjective:    Patient ID: Jennifer Pratt, female    DOB: [birth year], 60 y.o.   MRN: [id number]  HPI This visit occurred during the SARS-CoV-2 public health emergency.  Safety protocols were in place, including screening questions prior to the visit, additional usage of staff PPE, and extensive cleaning of exam room while observing appropriate contact time as indicated for disinfecting solutions.   Patient here for scheduled follow up. Here to follow up regarding her cholesterol, blood pressure and increased stress.  Overall she appears to be doing well.  No chest pain reported.  Breathing stable.  No increased cough or congestion.  No increased abdominal pain reported.  No bowel issues reported.  No sick contacts.  No fever.  No nausea or vomiting.  No seizures.  Taking Carbatrol. S/p removal of melanoma right arm.  Followed by dermatology.    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 Bary Castilla   BREAST LUMPECTOMY Right 07/17/2017   DCIS mammosite   CESAREAN SECTION     COLONOSCOPY N/A 10/05/2016   Procedure: COLONOSCOPY;  Surgeon: Manya Silvas, MD;  Location: [facility] ENDOSCOPY;  Service: Endoscopy;  Laterality: N/A;  Zosyn IVPB   JOINT REPLACEMENT     MASTECTOMY, PARTIAL Right 07/17/2017   Procedure: MASTECTOMY PARTIAL;  Surgeon: Robert Bellow, MD;  Location: [facility] ORS;  Service:  General;  Laterality: Right;   thumb surgery Right 2005   AND CTR   THYROIDECTOMY  1988   TOTAL KNEE ARTHROPLASTY Left 2012   Family History  Problem Relation Age of Onset   Diabetes Father    Dementia Mother    Breast cancer Neg Hx    Colon cancer Neg Hx    Social History   Socioeconomic History   Marital status: Married    Spouse name: Not on file   Number of children: 1   Years of education: Not on file   Highest education level: Not on file  Occupational History    Employer: [employer]  Tobacco Use   Smoking status: Former    Packs/day: 0.25    Years: 2.00    Pack years: 0.50    Types: Cigarettes    Quit date: 08/01/2017    Years since quitting: 3.8   Smokeless tobacco: Never   Tobacco comments:    PT ONLY SMOKES SOCIALLY-SHE MAY GO A FEW MONTHS WITHOUT EVER SMOKING   Vaping Use   Vaping Use: Never used  Substance and Sexual Activity   Alcohol use: Yes    Alcohol/week: 0.0 standard drinks    Comment: Socially   Drug use: No   Sexual activity: Yes  Other Topics Concern   Not on file  Social History Narrative   Not on file   Social Determinants of  Health   Financial Resource Strain: Not on file  Food Insecurity: Not on file  Transportation Needs: Not on file  Physical Activity: Not on file  Stress: Not on file  Social Connections: Not on file    Review of Systems  Constitutional:  Negative for appetite change and unexpected weight change.  HENT:  Negative for congestion and sinus pressure.   Respiratory:  Negative for cough, chest tightness and shortness of breath.   Cardiovascular:  Negative for chest pain, palpitations and leg swelling.  Gastrointestinal:  Negative for abdominal pain, diarrhea, nausea and vomiting.  Genitourinary:  Negative for difficulty urinating and dysuria.  Musculoskeletal:  Negative for joint swelling and myalgias.  Skin:  Negative for color change and rash.  Neurological:  Negative for dizziness, light-headedness and  headaches.  Psychiatric/Behavioral:  Negative for agitation and dysphoric mood.       Objective:    Physical Exam Vitals reviewed.  Constitutional:      General: She is not in acute distress.    Appearance: Normal appearance.  HENT:     Head: Normocephalic and atraumatic.     Right Ear: External ear normal.     Left Ear: External ear normal.  Eyes:     General: No scleral icterus.       Right eye: No discharge.        Left eye: No discharge.     Conjunctiva/sclera: Conjunctivae normal.  Neck:     Thyroid: No thyromegaly.  Cardiovascular:     Rate and Rhythm: Normal rate and regular rhythm.  Pulmonary:     Effort: No respiratory distress.     Breath sounds: Normal breath sounds. No wheezing.  Abdominal:     General: Bowel sounds are normal.     Palpations: Abdomen is soft.     Tenderness: There is no abdominal tenderness.  Musculoskeletal:        General: No swelling or tenderness.     Cervical back: Neck supple. No tenderness.  Lymphadenopathy:     Cervical: No cervical adenopathy.  Skin:    Findings: No erythema or rash.  Neurological:     Mental Status: She is alert.  Psychiatric:        Mood and Affect: Mood normal.        Behavior: Behavior normal.    BP 124/80 (BP Location: Left Arm, Patient Position: Sitting, Cuff Size: Large)   Pulse 97   Temp 99.3 F (37.4 C) (Oral)   Ht 5\' 9"  (1.753 m)   Wt 240 lb 6.4 oz (109 kg)   LMP 10/09/2012   SpO2 98%   BMI 35.50 kg/m  Wt Readings from Last 3 Encounters:  06/08/21 240 lb 6.4 oz (109 kg)  01/13/21 240 lb (108.9 kg)  12/30/20 239 lb (108.4 kg)    Outpatient Encounter Medications as of 06/08/2021  Medication Sig   amLODipine (NORVASC) 5 MG tablet TAKE 1 TABLET BY MOUTH EVERY DAY   carbamazepine (CARBATROL) 200 MG 12 hr capsule TAKE 1 CAPSULE BY MOUTH TWICE A DAY   diclofenac (VOLTAREN) 75 MG EC tablet Take 1 tablet (75 mg total) by mouth 2 (two) times daily.   DULoxetine (CYMBALTA) 60 MG capsule TAKE 1  CAPSULE BY MOUTH EVERY DAY   fluticasone (FLONASE) 50 MCG/ACT nasal spray Place 1 spray into both nostrils daily as needed for allergies or rhinitis.   levothyroxine (SYNTHROID) 175 MCG tablet TAKE 1 TABLET BY MOUTH EVERY DAY   pravastatin (PRAVACHOL) 20 MG  tablet TAKE 1 TABLET BY MOUTH EVERY DAY   raloxifene (EVISTA) 60 MG tablet TAKE 1 TABLET BY MOUTH EVERY DAY   sertraline (ZOLOFT) 50 MG tablet TAKE 1 TABLET BY MOUTH EVERY DAY   valsartan (DIOVAN) 160 MG tablet TAKE 1 TABLET BY MOUTH EVERY DAY   [DISCONTINUED] KLOR-CON 10 10 MEQ tablet TAKE 1 TABLET (10 MEQ TOTAL) BY MOUTH 2 (TWO) TIMES DAILY.   No facility-administered encounter medications on file as of 06/08/2021.     Lab Results  Component Value Date   WBC 5.5 07/23/2020   HGB 13.8 07/23/2020   HCT 40.8 07/23/2020   PLT 226.0 07/23/2020   GLUCOSE 97 12/30/2020   CHOL 186 12/30/2020   TRIG 134.0 12/30/2020   HDL 60.20 12/30/2020   LDLDIRECT 160.0 07/23/2020   LDLCALC 99 12/30/2020   ALT 19 12/30/2020   AST 15 12/30/2020   NA 140 12/30/2020   K 4.1 12/30/2020   CL 105 12/30/2020   CREATININE 0.83 12/30/2020   BUN 20 12/30/2020   CO2 27 12/30/2020   TSH 0.77 12/30/2020   INR 1.0 04/03/2020   HGBA1C 5.4 12/30/2020    MM DIAG BREAST TOMO BILATERAL  Result Date: 08/25/2020 CLINICAL DATA:  Status post right lumpectomy and radiation therapy breast cancer in 2018. EXAM: DIGITAL DIAGNOSTIC BILATERAL MAMMOGRAM WITH TOMO AND CAD COMPARISON:  Previous exam(s). ACR Breast Density Category b: There are scattered areas of fibroglandular density. FINDINGS: Stable post lumpectomy changes on the right. No interval findings suspicious for malignancy in either breast. Mammographic images were processed with CAD. IMPRESSION: No evidence of malignancy. RECOMMENDATION: Bilateral diagnostic mammogram in 1 year. I have discussed the findings and recommendations with the patient. If applicable, a reminder letter will be sent to the patient  regarding the next appointment. BI-RADS CATEGORY  2: Benign. Electronically Signed   By: Claudie Revering M.D.   On: 08/25/2020 09:42       Assessment & Plan:   Problem List Items Addressed This Visit     Ductal carcinoma in situ (DCIS) of right breast    Mammogram 08/25/2020 BI-RADS 2.  Has seen Dr. Bary Castilla.       Essential hypertension, benign    Continue diovan and amlodipine.  Blood pressure doing well.  Follow pressures.  Follow metabolic panel.        Relevant Orders   Basic metabolic panel   History of melanoma    Status post removal from right upper arm.  Followed by dermatology.       Hypercholesterolemia    On pravastatin.  Low-cholesterol diet and exercise.  Follow lipid panel liver function test.       Relevant Orders   Lipid panel   Hepatic function panel   Hypothyroidism    On thyroid replacement.  Follow TSH.       Relevant Orders   TSH   Obstructive sleep apnea    CPAP       Seizure disorder ([facility])    Remains on Carbatrol.  Follow levels.  Follow CBC and liver function.  No recent seizures.       Relevant Orders   Carbamazepine Level (Tegretol), total   Stress    On Zoloft and Cymbalta.  Stable.  Appears to be doing well.           Einar Pheasant, MD

## 2021-06-14 ENCOUNTER — Encounter: Payer: Self-pay | Admitting: Internal Medicine

## 2021-06-14 DIAGNOSIS — Z85820 Personal history of malignant melanoma of skin: Secondary | ICD-10-CM | POA: Insufficient documentation

## 2021-06-14 NOTE — Assessment & Plan Note (Signed)
Remains on Carbatrol.  Follow levels.  Follow CBC and liver function.  No recent seizures.

## 2021-06-14 NOTE — Assessment & Plan Note (Signed)
On pravastatin.  Low-cholesterol diet and exercise.  Follow lipid panel liver function test.

## 2021-06-14 NOTE — Assessment & Plan Note (Signed)
Continue diovan and amlodipine.  Blood pressure doing well.  Follow pressures.  Follow metabolic panel.

## 2021-06-14 NOTE — Assessment & Plan Note (Signed)
CPAP.  

## 2021-06-14 NOTE — Assessment & Plan Note (Signed)
Status post removal from right upper arm.  Followed by dermatology.

## 2021-06-14 NOTE — Assessment & Plan Note (Signed)
On Zoloft and Cymbalta.  Stable.  Appears to be doing well.

## 2021-06-14 NOTE — Assessment & Plan Note (Signed)
Mammogram 08/25/2020 BI-RADS 2.  Has seen Dr. Bary Castilla.

## 2021-06-14 NOTE — Assessment & Plan Note (Signed)
On thyroid replacement.  Follow TSH 

## 2021-07-12 ENCOUNTER — Other Ambulatory Visit: Payer: Self-pay | Admitting: General Surgery

## 2021-07-12 DIAGNOSIS — Z1231 Encounter for screening mammogram for malignant neoplasm of breast: Secondary | ICD-10-CM

## 2021-07-13 ENCOUNTER — Other Ambulatory Visit: Payer: Self-pay

## 2021-07-13 ENCOUNTER — Other Ambulatory Visit (INDEPENDENT_AMBULATORY_CARE_PROVIDER_SITE_OTHER): Payer: BC Managed Care – PPO

## 2021-07-13 DIAGNOSIS — I10 Essential (primary) hypertension: Secondary | ICD-10-CM | POA: Diagnosis not present

## 2021-07-13 DIAGNOSIS — G40909 Epilepsy, unspecified, not intractable, without status epilepticus: Secondary | ICD-10-CM

## 2021-07-13 DIAGNOSIS — E7800 Pure hypercholesterolemia, unspecified: Secondary | ICD-10-CM | POA: Diagnosis not present

## 2021-07-13 DIAGNOSIS — E039 Hypothyroidism, unspecified: Secondary | ICD-10-CM | POA: Diagnosis not present

## 2021-07-13 LAB — HEPATIC FUNCTION PANEL
ALT: 14 U/L (ref 0–35)
AST: 13 U/L (ref 0–37)
Albumin: 4.2 g/dL (ref 3.5–5.2)
Alkaline Phosphatase: 64 U/L (ref 39–117)
Bilirubin, Direct: 0.1 mg/dL (ref 0.0–0.3)
Total Bilirubin: 0.5 mg/dL (ref 0.2–1.2)
Total Protein: 6.8 g/dL (ref 6.0–8.3)

## 2021-07-13 LAB — LIPID PANEL
Cholesterol: 177 mg/dL (ref 0–200)
HDL: 60.3 mg/dL (ref >39.00)
LDL Cholesterol: 90 mg/dL (ref 0–99)
NonHDL: 116.5
Total CHOL/HDL Ratio: 3
Triglycerides: 131 mg/dL (ref 0.0–149.0)
VLDL: 26.2 mg/dL (ref 0.0–40.0)

## 2021-07-13 LAB — BASIC METABOLIC PANEL
BUN: 19 mg/dL (ref 6–23)
CO2: 31 mEq/L (ref 19–32)
Calcium: 9.3 mg/dL (ref 8.4–10.5)
Chloride: 102 mEq/L (ref 96–112)
Creatinine, Ser: 0.83 mg/dL (ref 0.40–1.20)
GFR: 77.15 mL/min (ref >60.00)
Glucose, Bld: 102 mg/dL — ABNORMAL HIGH (ref 70–99)
Potassium: 3.2 mEq/L — ABNORMAL LOW (ref 3.5–5.1)
Sodium: 143 mEq/L (ref 135–145)

## 2021-07-13 LAB — TSH: TSH: 0.73 u[IU]/mL (ref 0.35–5.50)

## 2021-07-14 LAB — CARBAMAZEPINE LEVEL, TOTAL: Carbamazepine Lvl: 6 mg/L (ref 4.0–12.0)

## 2021-07-16 MED ORDER — POTASSIUM CHLORIDE ER 10 MEQ PO TBCR
10.0000 meq | EXTENDED_RELEASE_TABLET | Freq: Every day | ORAL | 3 refills | Status: DC
Start: 2021-07-16 — End: 2021-09-14

## 2021-07-16 NOTE — Addendum Note (Signed)
Addended by: Elpidio Galea T on: 07/16/2021 11:00 AM   Modules accepted: Orders

## 2021-07-28 ENCOUNTER — Other Ambulatory Visit: Payer: Self-pay

## 2021-07-28 ENCOUNTER — Other Ambulatory Visit (INDEPENDENT_AMBULATORY_CARE_PROVIDER_SITE_OTHER): Payer: BC Managed Care – PPO

## 2021-07-28 DIAGNOSIS — I10 Essential (primary) hypertension: Secondary | ICD-10-CM | POA: Diagnosis not present

## 2021-07-28 LAB — BASIC METABOLIC PANEL
BUN: 13 mg/dL (ref 6–23)
CO2: 28 mEq/L (ref 19–32)
Calcium: 9.1 mg/dL (ref 8.4–10.5)
Chloride: 103 mEq/L (ref 96–112)
Creatinine, Ser: 0.76 mg/dL (ref 0.40–1.20)
GFR: 85.73 mL/min (ref >60.00)
Glucose, Bld: 84 mg/dL (ref 70–99)
Potassium: 3.8 mEq/L (ref 3.5–5.1)
Sodium: 140 mEq/L (ref 135–145)

## 2021-07-29 ENCOUNTER — Telehealth: Payer: Self-pay

## 2021-07-29 DIAGNOSIS — E876 Hypokalemia: Secondary | ICD-10-CM

## 2021-07-29 DIAGNOSIS — R739 Hyperglycemia, unspecified: Secondary | ICD-10-CM

## 2021-07-29 NOTE — Telephone Encounter (Signed)
Placed orders for Potassium for recheck labs

## 2021-08-24 ENCOUNTER — Other Ambulatory Visit: Payer: Self-pay

## 2021-08-24 ENCOUNTER — Other Ambulatory Visit (INDEPENDENT_AMBULATORY_CARE_PROVIDER_SITE_OTHER): Payer: BC Managed Care – PPO

## 2021-08-24 DIAGNOSIS — E876 Hypokalemia: Secondary | ICD-10-CM | POA: Diagnosis not present

## 2021-08-24 LAB — POTASSIUM: Potassium: 3.6 mEq/L (ref 3.5–5.1)

## 2021-08-26 ENCOUNTER — Other Ambulatory Visit: Payer: Self-pay

## 2021-08-26 ENCOUNTER — Ambulatory Visit
Admission: RE | Admit: 2021-08-26 | Discharge: 2021-08-26 | Disposition: A | Discharge status: Home / Self Care | Payer: BC Managed Care – PPO | Source: Ambulatory Visit | Attending: General Surgery | Admitting: General Surgery

## 2021-08-26 DIAGNOSIS — Z1231 Encounter for screening mammogram for malignant neoplasm of breast: Secondary | ICD-10-CM | POA: Insufficient documentation

## 2021-09-14 ENCOUNTER — Other Ambulatory Visit: Payer: Self-pay

## 2021-09-14 ENCOUNTER — Ambulatory Visit (INDEPENDENT_AMBULATORY_CARE_PROVIDER_SITE_OTHER): Payer: BC Managed Care – PPO | Admitting: Internal Medicine

## 2021-09-14 VITALS — BP 132/76 | HR 90 | Temp 97.9°F | Resp 16 | Ht 69.0 in | Wt 242.0 lb

## 2021-09-14 DIAGNOSIS — G40909 Epilepsy, unspecified, not intractable, without status epilepticus: Secondary | ICD-10-CM

## 2021-09-14 DIAGNOSIS — E7800 Pure hypercholesterolemia, unspecified: Secondary | ICD-10-CM

## 2021-09-14 DIAGNOSIS — F439 Reaction to severe stress, unspecified: Secondary | ICD-10-CM

## 2021-09-14 DIAGNOSIS — I10 Essential (primary) hypertension: Secondary | ICD-10-CM | POA: Diagnosis not present

## 2021-09-14 DIAGNOSIS — E876 Hypokalemia: Secondary | ICD-10-CM | POA: Diagnosis not present

## 2021-09-14 DIAGNOSIS — G4733 Obstructive sleep apnea (adult) (pediatric): Secondary | ICD-10-CM

## 2021-09-14 DIAGNOSIS — E039 Hypothyroidism, unspecified: Secondary | ICD-10-CM

## 2021-09-14 DIAGNOSIS — Z0000 Encounter for general adult medical examination without abnormal findings: Secondary | ICD-10-CM | POA: Diagnosis not present

## 2021-09-14 NOTE — Assessment & Plan Note (Addendum)
Physical today 09/14/21.  PAP 06/20/19 - negative with negative HPV - benign repair changes.  Repeat pap 8/18/221 - negative (atrophy) with negative HPV.  Colonoscopy 09/2016 - recommended f/u in 5 years.  Due.  Has f/u with Dr Bary Castilla next week. Plans to discuss f/u colonoscopy with him.  Mammogram 09/01/21 - Birads I.

## 2021-09-14 NOTE — Progress Notes (Signed)
Patient ID: Jennifer Pratt, female   DOB: 23-Apr-1961, 60 y.o.   MRN: [id number]   Subjective:    Patient ID: Jennifer Pratt, female    DOB: 26-Feb-1961, 60 y.o.   MRN: [id number]  This visit occurred during the SARS-CoV-2 public health emergency.  Safety protocols were in place, including screening questions prior to the visit, additional usage of staff PPE, and extensive cleaning of exam room while observing appropriate contact time as indicated for disinfecting solutions.   Patient here for her physical exam.   Chief Complaint  Patient presents with   Annual Exam   .   HPI She is doing well.  Trying to stay active.  No chest pain or sob reported.  No abdominal pain or bowel change reported.  No acid reflux.  Had flu vaccine two weeks ago.  Has f/u with Dr Bary Castilla next week. Discussed due colonoscopy.    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 Bary Castilla   BREAST LUMPECTOMY Right 07/17/2017   DCIS mammosite   CESAREAN SECTION     COLONOSCOPY N/A 10/05/2016   Procedure: COLONOSCOPY;  Surgeon: Manya Silvas, MD;  Location: [facility] ENDOSCOPY;  Service: Endoscopy;  Laterality: N/A;  Zosyn IVPB   JOINT REPLACEMENT     MASTECTOMY, PARTIAL Right 07/17/2017   Procedure: MASTECTOMY PARTIAL;  Surgeon: Robert Bellow, MD;  Location: [facility] ORS;  Service: General;  Laterality: Right;   thumb surgery Right 2005   AND CTR   THYROIDECTOMY  1988   TOTAL KNEE ARTHROPLASTY  Left 2012   Family History  Problem Relation Age of Onset   Diabetes Father    Dementia Mother    Breast cancer Neg Hx    Colon cancer Neg Hx    Social History   Socioeconomic History   Marital status: Married    Spouse name: Not on file   Number of children: 1   Years of education: Not on file   Highest education level: Not on file  Occupational History    Employer: [employer]  Tobacco Use   Smoking status: Former    Packs/day: 0.25    Years: 2.00    Pack years: 0.50    Types: Cigarettes    Quit date: 08/01/2017    Years since quitting: 4.1   Smokeless tobacco: Never   Tobacco comments:    PT ONLY SMOKES SOCIALLY-SHE MAY GO A FEW MONTHS WITHOUT EVER SMOKING   Vaping Use   Vaping Use: Never used  Substance and Sexual Activity   Alcohol use: Yes    Alcohol/week: 0.0 standard drinks    Comment: Socially   Drug use: No   Sexual activity: Yes  Other Topics Concern   Not on file  Social History Narrative   Not on file   Social Determinants of Health   Financial Resource Strain: Not on file  Food Insecurity: Not on file  Transportation Needs: Not on file  Physical Activity: Not on file  Stress: Not on file  Social Connections: Not on file     Review of Systems  Constitutional:  Negative for appetite change and unexpected weight change.  HENT:  Negative for congestion, sinus pressure and sore throat.   Eyes:  Negative for pain and visual disturbance.  Respiratory:  Negative for cough, chest tightness and shortness of breath.   Cardiovascular:  Negative for chest pain, palpitations and leg swelling.  Gastrointestinal:  Negative for abdominal pain, constipation, diarrhea and vomiting.  Genitourinary:  Negative for difficulty urinating and dysuria.  Musculoskeletal:  Negative for joint swelling and myalgias.  Skin:  Negative for color change and rash.  Neurological:  Negative for dizziness, light-headedness and headaches.  Hematological:  Negative for  adenopathy. Does not bruise/bleed easily.  Psychiatric/Behavioral:  Negative for agitation and dysphoric mood.       Objective:     BP 132/76   Pulse 90   Temp 97.9 F (36.6 C)   Resp 16   Ht 5\' 9"  (1.753 m)   Wt 242 lb (109.8 kg)   LMP 10/09/2012   SpO2 98%   BMI 35.74 kg/m  Wt Readings from Last 3 Encounters:  09/14/21 242 lb (109.8 kg)  06/08/21 240 lb 6.4 oz (109 kg)  01/13/21 240 lb (108.9 kg)    Physical Exam Vitals reviewed.  Constitutional:      General: She is not in acute distress.    Appearance: Normal appearance. She is well-developed.  HENT:     Head: Normocephalic and atraumatic.     Right Ear: External ear normal.     Left Ear: External ear normal.  Eyes:     General: No scleral icterus.       Right eye: No discharge.        Left eye: No discharge.     Conjunctiva/sclera: Conjunctivae normal.  Neck:     Thyroid: No thyromegaly.  Cardiovascular:     Rate and Rhythm: Normal rate and regular rhythm.  Pulmonary:     Effort: No tachypnea, accessory muscle usage or respiratory distress.     Breath sounds: Normal breath sounds. No decreased breath sounds or wheezing.  Chest:  Breasts:    Right: No inverted nipple, mass, nipple discharge or tenderness (no axillary adenopathy).     Left: No inverted nipple, mass, nipple discharge or tenderness (no axilarry adenopathy).  Abdominal:     General: Bowel sounds are normal.     Palpations: Abdomen is soft.     Tenderness: There is no abdominal tenderness.  Musculoskeletal:        General: No swelling or tenderness.     Cervical back: Neck supple.  Lymphadenopathy:     Cervical: No cervical adenopathy.  Skin:    Findings: No erythema or rash.  Neurological:     Mental Status: She is alert and oriented to person, place, and time.  Psychiatric:        Mood and Affect: Mood normal.        Behavior: Behavior normal.     Outpatient Encounter Medications as of 09/14/2021  Medication Sig   amLODipine  (NORVASC) 5 MG tablet TAKE 1 TABLET BY MOUTH EVERY DAY   carbamazepine (CARBATROL) 200 MG 12 hr capsule TAKE 1 CAPSULE BY MOUTH TWICE A DAY   diclofenac (VOLTAREN) 75 MG EC tablet Take 1 tablet (75 mg total) by mouth 2 (two)  times daily.   DULoxetine (CYMBALTA) 60 MG capsule TAKE 1 CAPSULE BY MOUTH EVERY DAY   fluticasone (FLONASE) 50 MCG/ACT nasal spray Place 1 spray into both nostrils daily as needed for allergies or rhinitis.   levothyroxine (SYNTHROID) 175 MCG tablet TAKE 1 TABLET BY MOUTH EVERY DAY   pravastatin (PRAVACHOL) 20 MG tablet TAKE 1 TABLET BY MOUTH EVERY DAY   raloxifene (EVISTA) 60 MG tablet TAKE 1 TABLET BY MOUTH EVERY DAY   sertraline (ZOLOFT) 50 MG tablet TAKE 1 TABLET BY MOUTH EVERY DAY   valsartan (DIOVAN) 160 MG tablet TAKE 1 TABLET BY MOUTH EVERY DAY   [DISCONTINUED] potassium chloride (KLOR-CON 10) 10 MEQ tablet Take 1 tablet (10 mEq total) by mouth daily. (Patient not taking: Reported on 09/14/2021)   No facility-administered encounter medications on file as of 09/14/2021.     Lab Results  Component Value Date   WBC 5.5 07/23/2020   HGB 13.8 07/23/2020   HCT 40.8 07/23/2020   PLT 226.0 07/23/2020   GLUCOSE 84 07/28/2021   CHOL 177 07/13/2021   TRIG 131.0 07/13/2021   HDL 60.30 07/13/2021   LDLDIRECT 160.0 07/23/2020   LDLCALC 90 07/13/2021   ALT 14 07/13/2021   AST 13 07/13/2021   NA 140 07/28/2021   K 3.6 09/14/2021   CL 103 07/28/2021   CREATININE 0.76 07/28/2021   BUN 13 07/28/2021   CO2 28 07/28/2021   TSH 0.73 07/13/2021   INR 1.0 04/03/2020   HGBA1C 5.4 12/30/2020    MM 3D SCREEN BREAST BILATERAL  Result Date: 09/01/2021 CLINICAL DATA:  Screening. EXAM: DIGITAL SCREENING BILATERAL MAMMOGRAM WITH TOMOSYNTHESIS AND CAD TECHNIQUE: Bilateral screening digital craniocaudal and mediolateral oblique mammograms were obtained. Bilateral screening digital breast tomosynthesis was performed. The images were evaluated with computer-aided detection.  COMPARISON:  Previous exam(s). ACR Breast Density Category b: There are scattered areas of fibroglandular density. FINDINGS: There are no findings suspicious for malignancy. IMPRESSION: No mammographic evidence of malignancy. A result letter of this screening mammogram will be mailed directly to the patient. RECOMMENDATION: Screening mammogram in one year. (Code:SM-B-01Y) BI-RADS CATEGORY  1: Negative. Electronically Signed   By: Audie Pinto M.D.   On: 09/01/2021 12:21      Assessment & Plan:   Problem List Items Addressed This Visit     Essential hypertension, benign    Continue diovan and amlodipine.  Blood pressure doing well.  Follow pressures.  Follow metabolic panel.       Relevant Orders   Basic metabolic panel   Health care maintenance    Physical today 09/14/21.  PAP 06/20/19 - negative with negative HPV - benign repair changes.  Repeat pap 8/18/221 - negative (atrophy) with negative HPV.  Colonoscopy 09/2016 - recommended f/u in 5 years.  Due.  Has f/u with Dr Bary Castilla next week. Plans to discuss f/u colonoscopy with him.  Mammogram 09/01/21 - Birads I.        Hypercholesterolemia    On pravastatin.  Low-cholesterol diet and exercise.  Follow lipid panel liver function test.      Relevant Orders   Hepatic function panel   Lipid panel   Hypokalemia    Not on potassium.  Follow potassium level.       Relevant Orders   Potassium (Completed)   Hypothyroidism    On thyroid replacement.  Follow TSH.      Obstructive sleep apnea    CPAP.       Seizure disorder ([place])  Remains on Carbatrol.  Follow levels.  Follow CBC and liver function.  No recent seizures.      Relevant Orders   CBC with Differential/Platelet   Carbamazepine level, total   Stress    On Zoloft and Cymbalta.  Stable.  Appears to be doing well.        Other Visit Diagnoses     Routine general medical examination at a health care facility    -  Primary        Einar Pheasant, MD

## 2021-09-15 LAB — POTASSIUM: Potassium: 3.6 mEq/L (ref 3.5–5.1)

## 2021-09-21 DIAGNOSIS — Z1211 Encounter for screening for malignant neoplasm of colon: Secondary | ICD-10-CM | POA: Diagnosis not present

## 2021-09-21 DIAGNOSIS — Z853 Personal history of malignant neoplasm of breast: Secondary | ICD-10-CM | POA: Diagnosis not present

## 2021-09-22 ENCOUNTER — Other Ambulatory Visit: Payer: Self-pay | Admitting: General Surgery

## 2021-09-22 NOTE — Progress Notes (Signed)
Subjective:     Patient ID: Jennifer Pratt is a 60 y.o. female.   HPI       This an established patient is here today for: office visit. Here for her follow up mammogram, post right lumpectomy in 2018. Patient reports no new breast concerns.    The patient would also like to discuss a colonoscopy. Patient reports her last colonoscopy was done in 2017. She reports she has bowel movements once per day. She denies any rectal bleeding or mucus.        Review of Systems  Constitutional: Negative for chills and fever.  Respiratory: Negative for cough.          Chief Complaint  Patient presents with   Follow-up      Pulse 98   Temp 36.3 C (97.3 F)   Ht 175.3 cm (5\' 9" )   Wt (!) 108.9 kg (240 lb)   LMP  (LMP Unknown)   SpO2 95%   BMI 35.44 kg/m        Past Medical History:  Diagnosis Date   Arthritis     Breast cancer ([facility]) 06/28/2017    Right, high-grade DCIS   Hypercholesterolemia     Hypertension     Hypothyroidism     Osteoarthritis     Seizures ([facility])     Sleep apnea             Past Surgical History:  Procedure Laterality Date   BREAST EXCISIONAL BIOPSY Right 07/17/2017   CATARACT EXTRACTION   09/2020   CESAREAN SECTION       COLONOSCOPY   10/05/2016    Serrated Adenoma: CBF 09/2021   EGD   04/29/2008    No repeat per [facility]   history of hip replacement Right 2021   JOINT REPLACEMENT Left 2012   MASTECTOMY PARTIAL / LUMPECTOMY Right 07/17/2017    10 mm foci high-grade DCIS, ER positive.   thumb surgery Right 2005   THYROIDECTOMY TOTAL   1988                OB History     Gravida  1   Para  1   Term      Preterm      AB      Living         SAB      IAB      Ectopic      Molar      Multiple      Live Births           Obstetric Comments  Age at first period 11 Age of first pregnancy 66             Social History           Socioeconomic History   Marital status: Married  Tobacco Use   Smoking status:  Former Smoker      Packs/day: 0.25      Years: 2.00      Pack years: 0.50      Types: Cigarettes      Quit date: 08/01/2017      Years since quitting: 4.1   Smokeless tobacco: Never Used  Substance and Sexual Activity   Alcohol use: Yes      Comment: occassionally   Drug use: Never            Allergies  Allergen Reactions   Lisinopril Cough      Current Medications  Current Outpatient Medications  Medication Sig Dispense Refill   carBAMazepine (CARBATROL) 200 MG CR capsule Take 200 mg by mouth 2 (two) times daily.       diclofenac (VOLTAREN) 75 MG EC tablet once daily.       DULoxetine (CYMBALTA) 30 MG DR capsule Take 30 mg by mouth once daily.       fluticasone propionate (FLONASE) 50 mcg/actuation nasal spray Place into one nostril       levothyroxine (SYNTHROID, LEVOTHROID) 175 MCG tablet Take 175 mcg by mouth once daily. Take on an empty stomach with a glass of water at least 30-60 minutes before breakfast.       polyethylene glycol (MIRALAX) powder Take as directed for colonoscopy prep. 255 g 0   pravastatin (PRAVACHOL) 10 MG tablet TAKE 1 TABLET (10 MG TOTAL) BY MOUTH DAILY.       raloxifene (EVISTA) 60 mg tablet Take 1 tablet (60 mg total) by mouth once daily 90 tablet 3   sertraline (ZOLOFT) 50 MG tablet Take 1 tablet by mouth once daily       valsartan (DIOVAN) 80 MG tablet TAKE 1 TABLET (80 MG TOTAL) BY MOUTH DAILY.        No current facility-administered medications for this visit.             Family History  Problem Relation Age of Onset   Dementia Mother     Diabetes type II Father     Colon cancer Neg Hx     Breast cancer Neg Hx          Labs and Radiology:    Mammogram review:   Bilateral screening mammograms dated August 26, 2021 were independently reviewed.  Postsurgical changes of the right.  No interval change.   Colonoscopy pathology review:   A. COLON POLYP, HEPATIC FLEXURE; COLD SNARE:  - CONSISTENT WITH TRADITIONAL SERRATED  ADENOMA.  - NEGATIVE FOR DYSPLASIA AND MALIGNANCY.    Size: aggregate, 1.1 x 0.5 x 0.1 cm     B. COLON POLYP, PROXIMAL SIGMOID; COLD BIOPSY:  - NO PATHOLOGIC CHANGE.    Laboratory review:   Component Ref Range & Units 8 d ago  (09/14/21) 4 wk ago  (08/24/21) 1 mo ago  (07/28/21) 2 mo ago  (07/13/21) 8 mo ago  (12/30/20) 1 yr ago  (08/07/20) 1 yr ago  (07/23/20)  Potassium 3.5 - 5.1 mEq/L 3.6  3.6  3.8  3.2 Low   4.1  3.9  3.4 Low             Objective:   Physical Exam Exam conducted with a chaperone present.  Constitutional:      Appearance: Normal appearance.  Cardiovascular:     Rate and Rhythm: Normal rate and regular rhythm.     Pulses: Normal pulses.     Heart sounds: Normal heart sounds.  Pulmonary:     Effort: Pulmonary effort is normal.     Breath sounds: Normal breath sounds.  Chest:  Breasts:     Left: Normal.         Comments: Focal area of thickening along the wide excision site secondary to MammoSite radiation. Musculoskeletal:     Cervical back: Neck supple.  Lymphadenopathy:     Upper Body:     Right upper body: No supraclavicular or axillary adenopathy.     Left upper body: No supraclavicular or axillary adenopathy.  Skin:    General: Skin is warm and dry.  Neurological:  Mental Status: She is alert and oriented to person, place, and time.  Psychiatric:        Mood and Affect: Mood normal.        Behavior: Behavior normal.           Assessment:     No evidence of recurrent breast cancer.   Candidate for repeat colonoscopy based on the identification of a sessile serrated adenoma in 2017.    Plan:     Follow-up screening mammograms in 1 year.   Indications for colonoscopy reviewed with the patient.  Prep instructions reviewed by staff.      This note is partially prepared by Karie Fetch, RN, acting as a scribe in the presence of Dr. Hervey Ard, MD.  The documentation recorded by the scribe accurately reflects the service I  personally performed and the decisions made by me.    Robert Bellow, MD FACS

## 2021-09-27 ENCOUNTER — Encounter: Payer: Self-pay | Admitting: Internal Medicine

## 2021-09-27 NOTE — Assessment & Plan Note (Signed)
Not on potassium.  Follow potassium level.

## 2021-09-27 NOTE — Assessment & Plan Note (Signed)
On Zoloft and Cymbalta.  Stable.  Appears to be doing well.

## 2021-09-27 NOTE — Assessment & Plan Note (Signed)
On thyroid replacement.  Follow TSH 

## 2021-09-27 NOTE — Assessment & Plan Note (Signed)
Continue diovan and amlodipine.  Blood pressure doing well.  Follow pressures.  Follow metabolic panel.

## 2021-09-27 NOTE — Assessment & Plan Note (Signed)
CPAP.  

## 2021-09-27 NOTE — Assessment & Plan Note (Signed)
Remains on Carbatrol.  Follow levels.  Follow CBC and liver function.  No recent seizures.

## 2021-09-27 NOTE — Assessment & Plan Note (Signed)
On pravastatin.  Low-cholesterol diet and exercise.  Follow lipid panel liver function test.

## 2021-10-05 ENCOUNTER — Other Ambulatory Visit: Payer: Self-pay | Admitting: Internal Medicine

## 2021-10-11 ENCOUNTER — Encounter: Payer: Self-pay | Admitting: Internal Medicine

## 2021-10-11 MED ORDER — VALSARTAN 160 MG PO TABS: 160.0000 mg | ORAL_TABLET | Freq: Every day | ORAL | 2 refills | Status: DC

## 2021-10-11 MED ORDER — SERTRALINE HCL 50 MG PO TABS: 50.0000 mg | ORAL_TABLET | Freq: Every day | ORAL | 1 refills | Status: DC

## 2021-11-09 ENCOUNTER — Encounter: Payer: Self-pay | Admitting: General Surgery

## 2021-11-10 ENCOUNTER — Encounter: Payer: Self-pay | Admitting: General Surgery

## 2021-11-10 ENCOUNTER — Encounter
Admission: RE | Disposition: A | Discharge status: Home / Self Care | Payer: Self-pay | Source: Home / Self Care | Attending: General Surgery

## 2021-11-10 ENCOUNTER — Ambulatory Visit: Payer: BC Managed Care – PPO | Admitting: Anesthesiology

## 2021-11-10 ENCOUNTER — Ambulatory Visit
Admission: RE | Admit: 2021-11-10 | Discharge: 2021-11-10 | Disposition: A | Discharge status: Home / Self Care | Payer: BC Managed Care – PPO | Attending: General Surgery | Admitting: General Surgery

## 2021-11-10 DIAGNOSIS — Z86010 Personal history of colonic polyps: Secondary | ICD-10-CM | POA: Insufficient documentation

## 2021-11-10 DIAGNOSIS — F17200 Nicotine dependence, unspecified, uncomplicated: Secondary | ICD-10-CM | POA: Diagnosis not present

## 2021-11-10 DIAGNOSIS — Z1211 Encounter for screening for malignant neoplasm of colon: Secondary | ICD-10-CM | POA: Diagnosis not present

## 2021-11-10 DIAGNOSIS — K5730 Diverticulosis of large intestine without perforation or abscess without bleeding: Secondary | ICD-10-CM | POA: Insufficient documentation

## 2021-11-10 DIAGNOSIS — G4733 Obstructive sleep apnea (adult) (pediatric): Secondary | ICD-10-CM | POA: Diagnosis not present

## 2021-11-10 HISTORY — PX: COLONOSCOPY WITH PROPOFOL: SHX5780

## 2021-11-10 HISTORY — DX: Unspecified osteoarthritis, unspecified site: M19.90

## 2021-11-10 SURGERY — COLONOSCOPY WITH PROPOFOL
Anesthesia: General

## 2021-11-10 MED ORDER — PROPOFOL 500 MG/50ML IV EMUL
INTRAVENOUS | Status: AC
  Filled 2021-11-10: qty 50

## 2021-11-10 MED ORDER — PROPOFOL 10 MG/ML IV BOLUS
INTRAVENOUS | Status: DC | PRN
  Administered 2021-11-10: 90 mg via INTRAVENOUS

## 2021-11-10 MED ORDER — SODIUM CHLORIDE 0.9 % IV SOLN: INTRAVENOUS | Status: DC

## 2021-11-10 MED ORDER — LIDOCAINE HCL (CARDIAC) PF 100 MG/5ML IV SOSY
PREFILLED_SYRINGE | INTRAVENOUS | Status: DC | PRN
  Administered 2021-11-10: 40 mg via INTRAVENOUS

## 2021-11-10 MED ORDER — PROPOFOL 500 MG/50ML IV EMUL
INTRAVENOUS | Status: DC | PRN
  Administered 2021-11-10: 150 ug/kg/min via INTRAVENOUS

## 2021-11-10 NOTE — Anesthesia Postprocedure Evaluation (Signed)
Anesthesia Post Note  Patient: Jennifer Pratt  Procedure(s) Performed: COLONOSCOPY WITH PROPOFOL  Patient location during evaluation: Endoscopy Anesthesia Type: General Level of consciousness: awake and alert Pain management: pain level controlled Vital Signs Assessment: post-procedure vital signs reviewed and stable Respiratory status: spontaneous breathing, nonlabored ventilation, respiratory function stable and patient connected to nasal cannula oxygen Cardiovascular status: blood pressure returned to baseline and stable Postop Assessment: no apparent nausea or vomiting Anesthetic complications: no   No notable events documented.   Last Vitals:  Vitals:   11/10/21 0758 11/10/21 0807  BP: 122/83   Pulse:    Resp:    Temp:  (!) 35.9 C  SpO2:      Last Pain:  Vitals:   11/10/21 0810  TempSrc:   PainSc: 0-No pain                 Arita Miss

## 2021-11-10 NOTE — Op Note (Signed)
[facility] Gastroenterology Patient Name: Jennifer Pratt Procedure Date: 11/10/2021 6:58 AM MRN: [id number] Account #: [id number] Date of Birth: Dec 05, 1960 Admit Type: Outpatient Age: 60 Room: [facility] ENDO ROOM 1 Gender: Female Note Status: Finalized Instrument Name: Peds Colonoscope [id number] Procedure:             Colonoscopy Indications:           High risk colon cancer surveillance: Personal history                         of colonic polyps Providers:             Robert Bellow, MD Referring MD:          Einar Pheasant, MD (Referring MD) Medicines:             Propofol per Anesthesia Complications:         No immediate complications. Procedure:             Pre-Anesthesia Assessment:                        - Prior to the procedure, a History and Physical was                         performed, and patient medications, allergies and                         sensitivities were reviewed. The patient's tolerance                         of previous anesthesia was reviewed.                        - The risks and benefits of the procedure and the                         sedation options and risks were discussed with the                         patient. All questions were answered and informed                         consent was obtained.                        After obtaining informed consent, the colonoscope was                         passed under direct vision. Throughout the procedure,                         the patient's blood pressure, pulse, and oxygen                         saturations were monitored continuously. The                         Colonoscope was introduced through the anus and                         advanced  to the the cecum, identified by appendiceal                         orifice and ileocecal valve. The colonoscopy was                         somewhat difficult due to a tortuous colon. Successful                         completion of the  procedure was aided by using manual                         pressure. The patient tolerated the procedure well.                         The quality of the bowel preparation was excellent. Findings:      Many medium-mouthed diverticula were found in the sigmoid colon.      The retroflexed view of the distal rectum and anal verge was normal and       showed no anal or rectal abnormalities. Impression:            - Diverticulosis in the sigmoid colon.                        - The distal rectum and anal verge are normal on                         retroflexion view.                        - No specimens collected. Recommendation:        - Repeat colonoscopy in 8 years for surveillance. Procedure Code(s):     --- Professional ---                        [phone], Colonoscopy, flexible; diagnostic, including                         collection of specimen(s) by brushing or washing, when                         performed (separate procedure) Diagnosis Code(s):     --- Professional ---                        Z86.010, Personal history of colonic polyps                        K57.30, Diverticulosis of large intestine without                         perforation or abscess without bleeding CPT copyright 2019 American Medical Association. All rights reserved. The codes documented in this report are preliminary and upon coder review may  be revised to meet current compliance requirements. Robert Bellow, MD 11/10/2021 7:58:43 AM This report has been signed electronically. Number of Addenda: 0 Note Initiated On: 11/10/2021 6:58 AM Scope Withdrawal Time: 0 hours 11 minutes 39 seconds  Total Procedure Duration: 0 hours 21 minutes 0 seconds  Estimated Blood Loss:  Estimated blood loss: none.      [facility]

## 2021-11-10 NOTE — H&P (Signed)
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 For follow up exam. Tolerated prep well.   Medications Prior to Admission  Medication Sig Dispense Refill Last Dose   amLODipine (NORVASC) 5 MG tablet TAKE 1 TABLET BY MOUTH EVERY DAY 90 tablet 1 11/09/2021   carbamazepine (CARBATROL) 200 MG 12 hr capsule TAKE 1 CAPSULE BY MOUTH TWICE A DAY 180 capsule 4 11/10/2021   diclofenac (VOLTAREN) 75 MG EC tablet Take 1 tablet (75 mg total) by mouth 2 (two) times daily. 60 tablet 0 11/09/2021   DULoxetine (CYMBALTA) 60 MG capsule TAKE 1 CAPSULE BY MOUTH EVERY DAY 90 capsule 1 11/09/2021   fluticasone (FLONASE) 50 MCG/ACT nasal spray Place 1 spray into both nostrils daily as needed for allergies or rhinitis.   11/09/2021   levothyroxine (SYNTHROID) 175 MCG tablet TAKE 1 TABLET BY MOUTH EVERY DAY 90 tablet 3 11/09/2021   pravastatin (PRAVACHOL) 20 MG tablet TAKE 1 TABLET BY MOUTH EVERY DAY 90 tablet 1 11/09/2021   raloxifene (EVISTA) 60 MG tablet TAKE 1 TABLET BY MOUTH EVERY DAY 90 tablet 4 11/09/2021   sertraline (ZOLOFT) 50 MG tablet Take 1 tablet (50 mg total) by mouth daily. 90 tablet 1 11/09/2021   valsartan (DIOVAN) 160 MG tablet Take 1 tablet (160 mg total) by mouth daily. 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 Bary Castilla   BREAST LUMPECTOMY Right 07/17/2017   DCIS mammosite   CATARACT EXTRACTION     CESAREAN SECTION     COLONOSCOPY N/A 10/05/2016   Procedure: COLONOSCOPY;  Surgeon: Manya Silvas, MD;  Location: [facility] ENDOSCOPY;  Service: Endoscopy;  Laterality: N/A;  Zosyn IVPB   JOINT REPLACEMENT     MASTECTOMY, PARTIAL Right 07/17/2017   Procedure: MASTECTOMY PARTIAL;  Surgeon: Robert Bellow, MD;  Location: [facility] ORS;  Service: General;  Laterality: Right;   thumb surgery Right 2005   Plum   TOTAL KNEE ARTHROPLASTY Left 2012   Social History   Socioeconomic History   Marital status: Married    Spouse name: Not on file   Number of children: 1   Years of education: Not on file   Highest education level: Not on file  Occupational History    Employer: [employer]  Tobacco Use   Smoking status: Some Days    Packs/day: 0.25    Years: 2.00    Pack years: 0.50    Types: Cigarettes    Last attempt to quit: 08/01/2017    Years since quitting: 4.2   Smokeless tobacco: Never   Tobacco comments:    PT ONLY SMOKES SOCIALLY-SHE MAY GO A  FEW MONTHS WITHOUT EVER SMOKING   Vaping Use   Vaping Use: Never used  Substance and Sexual Activity   Alcohol use: Not Currently   Drug use: No   Sexual activity: Yes  Other Topics Concern   Not on file  Social History Narrative   Not on file   Social Determinants of Health   Financial Resource Strain: Not on file  Food Insecurity: Not on file  Transportation Needs: Not on file  Physical Activity: Not on file  Stress: Not on file  Social Connections: Not on file  Intimate Partner Violence: Not on file   Social History   Social History Narrative   Not on file     ROS: Negative.     PE: HEENT: Negative. Lungs: Clear. Cardio: RR.  Assessment/Plan:  Proceed with planned endoscopy.  Forest Gleason [facility] 11/10/2021

## 2021-11-10 NOTE — Anesthesia Preprocedure Evaluation (Signed)
Anesthesia Evaluation  Patient identified by MRN, date of birth, ID band Patient awake    Reviewed: Allergy & Precautions, NPO status , Patient's Chart, lab work & pertinent test results  History of Anesthesia Complications Negative for: history of anesthetic complications  Airway Mallampati: III  TM Distance: >3 FB Neck ROM: Full    Dental no notable dental hx. (+) Teeth Intact   Pulmonary sleep apnea and Continuous Positive Airway Pressure Ventilation , neg COPD, Current Smoker and Patient abstained from smoking.,    Pulmonary exam normal breath sounds clear to auscultation       Cardiovascular Exercise Tolerance: Good METShypertension, (-) CAD and (-) Past MI (-) dysrhythmias  Rhythm:Regular Rate:Normal - Systolic murmurs    Neuro/Psych Seizures -, Well Controlled,  PSYCHIATRIC DISORDERS Anxiety Last seizure 2001    GI/Hepatic PUD, neg GERD  ,(+)     (-) substance abuse  ,   Endo/Other  neg diabetesHypothyroidism   Renal/GU negative Renal ROS     Musculoskeletal   Abdominal   Peds  Hematology   Anesthesia Other Findings Past Medical History: No date: Arthritis 06/28/2017: Breast cancer (HCC)     Comment:  Grade 3, DCIS, 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 No date: Sleep apnea     Comment:  USES CPAP No date: Ulcer     Comment:  Gastric  Reproductive/Obstetrics                             Anesthesia Physical Anesthesia Plan  ASA: 3  Anesthesia Plan: General   Post-op Pain Management: Minimal or no pain anticipated   Induction: Intravenous  PONV Risk Score and Plan: 2 and Propofol infusion, TIVA and Ondansetron  Airway Management Planned: Nasal Cannula  Additional Equipment: None  Intra-op  Plan:   Post-operative Plan:   Informed Consent: I have reviewed the patients History and Physical, chart, labs and discussed the procedure including the risks, benefits and alternatives for the proposed anesthesia with the patient or authorized representative who has indicated his/her understanding and acceptance.     Dental advisory given  Plan Discussed with: CRNA and Surgeon  Anesthesia Plan Comments: (Discussed risks of anesthesia with patient, including possibility of difficulty with spontaneous ventilation under anesthesia necessitating airway intervention, PONV, and rare risks such as cardiac or respiratory or neurological events, and allergic reactions. Discussed the role of CRNA in patient's perioperative care. Patient understands.)        Anesthesia Quick Evaluation

## 2021-11-10 NOTE — Transfer of Care (Signed)
Immediate Anesthesia Transfer of Care Note  Patient: Jennifer Pratt  Procedure(s) Performed: Procedure(s): COLONOSCOPY WITH PROPOFOL (N/A)  Patient Location: PACU and Endoscopy Unit  Anesthesia Type:General  Level of Consciousness: sedated  Airway & Oxygen Therapy: Patient Spontanous Breathing and Patient connected to nasal cannula oxygen  Post-op Assessment: Report given to RN and Post -op Vital signs reviewed and stable  Post vital signs: Reviewed and stable  Last Vitals:  Vitals:   11/10/21 0703  BP: (!) 147/101  Pulse: 82  Resp: 18  Temp: (!) 36 C  SpO2: 183%    Complications: No apparent anesthesia complications

## 2021-11-11 ENCOUNTER — Encounter: Payer: Self-pay | Admitting: General Surgery

## 2021-11-12 DIAGNOSIS — M1611 Unilateral primary osteoarthritis, right hip: Secondary | ICD-10-CM | POA: Diagnosis not present

## 2021-11-17 ENCOUNTER — Other Ambulatory Visit: Payer: BC Managed Care – PPO

## 2021-12-08 DIAGNOSIS — M67911 Unspecified disorder of synovium and tendon, right shoulder: Secondary | ICD-10-CM | POA: Diagnosis not present

## 2021-12-08 DIAGNOSIS — M25511 Pain in right shoulder: Secondary | ICD-10-CM | POA: Diagnosis not present

## 2021-12-14 ENCOUNTER — Other Ambulatory Visit: Payer: Self-pay

## 2021-12-14 ENCOUNTER — Other Ambulatory Visit (INDEPENDENT_AMBULATORY_CARE_PROVIDER_SITE_OTHER): Payer: BC Managed Care – PPO

## 2021-12-14 DIAGNOSIS — E7800 Pure hypercholesterolemia, unspecified: Secondary | ICD-10-CM

## 2021-12-14 DIAGNOSIS — I10 Essential (primary) hypertension: Secondary | ICD-10-CM

## 2021-12-14 DIAGNOSIS — G40909 Epilepsy, unspecified, not intractable, without status epilepticus: Secondary | ICD-10-CM | POA: Diagnosis not present

## 2021-12-14 LAB — CBC WITH DIFFERENTIAL/PLATELET
Basophils Absolute: 0.1 10*3/uL (ref 0.0–0.1)
Basophils Relative: 1.3 % (ref 0.0–3.0)
Eosinophils Absolute: 0.1 10*3/uL (ref 0.0–0.7)
Eosinophils Relative: 2.2 % (ref 0.0–5.0)
HCT: 41.5 % (ref 36.0–46.0)
Hemoglobin: 13.9 g/dL (ref 12.0–15.0)
Lymphocytes Relative: 38.3 % (ref 12.0–46.0)
Lymphs Abs: 2.1 10*3/uL (ref 0.7–4.0)
MCHC: 33.4 g/dL (ref 30.0–36.0)
MCV: 92 fl (ref 78.0–100.0)
Monocytes Absolute: 0.4 10*3/uL (ref 0.1–1.0)
Monocytes Relative: 6.4 % (ref 3.0–12.0)
Neutro Abs: 2.9 10*3/uL (ref 1.4–7.7)
Neutrophils Relative %: 51.8 % (ref 43.0–77.0)
Platelets: 249 10*3/uL (ref 150.0–400.0)
RBC: 4.51 Mil/uL (ref 3.87–5.11)
RDW: 13.3 % (ref 11.5–15.5)
WBC: 5.6 10*3/uL (ref 4.0–10.5)

## 2021-12-14 LAB — BASIC METABOLIC PANEL
BUN: 18 mg/dL (ref 6–23)
CO2: 31 mEq/L (ref 19–32)
Calcium: 9.2 mg/dL (ref 8.4–10.5)
Chloride: 102 mEq/L (ref 96–112)
Creatinine, Ser: 0.8 mg/dL (ref 0.40–1.20)
GFR: 80.39 mL/min (ref >60.00)
Glucose, Bld: 106 mg/dL — ABNORMAL HIGH (ref 70–99)
Potassium: 3.9 mEq/L (ref 3.5–5.1)
Sodium: 140 mEq/L (ref 135–145)

## 2021-12-14 LAB — LIPID PANEL
Cholesterol: 210 mg/dL — ABNORMAL HIGH (ref 0–200)
HDL: 59.5 mg/dL (ref >39.00)
LDL Cholesterol: 117 mg/dL — ABNORMAL HIGH (ref 0–99)
NonHDL: 150
Total CHOL/HDL Ratio: 4
Triglycerides: 164 mg/dL — ABNORMAL HIGH (ref 0.0–149.0)
VLDL: 32.8 mg/dL (ref 0.0–40.0)

## 2021-12-14 LAB — HEPATIC FUNCTION PANEL
ALT: 18 U/L (ref 0–35)
AST: 13 U/L (ref 0–37)
Albumin: 4.5 g/dL (ref 3.5–5.2)
Alkaline Phosphatase: 66 U/L (ref 39–117)
Bilirubin, Direct: 0.1 mg/dL (ref 0.0–0.3)
Total Bilirubin: 0.5 mg/dL (ref 0.2–1.2)
Total Protein: 7.1 g/dL (ref 6.0–8.3)

## 2021-12-15 LAB — CARBAMAZEPINE LEVEL, TOTAL: Carbamazepine Lvl: 5.5 mg/L (ref 4.0–12.0)

## 2021-12-16 ENCOUNTER — Other Ambulatory Visit: Payer: Self-pay | Admitting: Internal Medicine

## 2022-01-10 ENCOUNTER — Other Ambulatory Visit: Payer: Self-pay | Admitting: Internal Medicine

## 2022-01-13 ENCOUNTER — Ambulatory Visit (INDEPENDENT_AMBULATORY_CARE_PROVIDER_SITE_OTHER): Payer: BC Managed Care – PPO | Admitting: Neurology

## 2022-01-13 ENCOUNTER — Encounter: Payer: Self-pay | Admitting: Neurology

## 2022-01-13 VITALS — BP 152/106 | HR 90 | Ht 69.0 in | Wt 241.5 lb

## 2022-01-13 DIAGNOSIS — G40909 Epilepsy, unspecified, not intractable, without status epilepticus: Secondary | ICD-10-CM | POA: Diagnosis not present

## 2022-01-13 MED ORDER — CARBAMAZEPINE ER 200 MG PO CP12
ORAL_CAPSULE | ORAL | 4 refills | Status: DC
Start: 2022-01-13 — End: 2023-01-18

## 2022-01-13 NOTE — Progress Notes (Signed)
PATIENT: Jennifer Pratt DOB: 1961-01-04  REASON FOR VISIT: follow up for seizures HISTORY FROM: patient PRIMARY NEUROLOGIST: Dr. April Manson   HISTORY OF PRESENT ILLNESS: Today 01/13/22 [name] is here today for follow-up for seizures. Well controlled on Carbatrol. No seizures since starting the medication around 2000. Has had 1 grand mal seizures, the rest were focal aware seizures, hear music playing for 1 minute always around her menstrual cycle. Carbamazepine level was 5.5 in Jan 2023,  CBC, BMP, LFT unremarkable. Still commuting for work 80 miles.   Update 01/13/2021 SS: [name] Is a 61 year old female with history of seizures, well-controlled on Carbatrol. Her seizures used to be associated with her menstrual cycle, she has not had a period in several years.  Continues to do well, she had a hip replacement on the right in May, did great.  She continues to work full-time, drives 80 miles round trip daily.  She rides her stationary bike every morning.  Has history of breast cancer, sees oncology annually.  Recently saw her PCP, routine labs, and carbamazepine level were reviewed.  Here today for evaluation unaccompanied.  HISTORY 01/13/2020 SS: [name] is a 61 year old female with history of seizures, well controlled on Carbatrol.  Her seizures used to be associated with her menstrual cycle, but she has not had a period in several years.  She had routine lab work in August 2020, carbamazepine level was 5.6, liver function was normal.  She has not had a seizure in about 20 years.  She is currently in physical therapy for leg pain, lumbar stenosis.  She is considering getting a neurosurgical opinion.  She works full-time, in [employer].  She drives about 80 miles back and forth to work.  She needs a refill medication.  She presents today for evaluation unaccompanied.   REVIEW OF SYSTEMS: Out of a complete 14 system review of symptoms, the patient complains only of the following symptoms, and  all other reviewed systems are negative.  n/a  ALLERGIES: Allergies  Allergen Reactions   Lisinopril Other (See Comments)    UNKNOWN     HOME MEDICATIONS: Outpatient Medications Prior to Visit  Medication Sig Dispense Refill   amLODipine (NORVASC) 5 MG tablet TAKE 1 TABLET BY MOUTH EVERY DAY 90 tablet 1   diclofenac (VOLTAREN) 75 MG EC tablet Take 1 tablet (75 mg total) by mouth 2 (two) times daily. 60 tablet 0   DULoxetine (CYMBALTA) 60 MG capsule TAKE 1 CAPSULE BY MOUTH EVERY DAY 90 capsule 1   fluticasone (FLONASE) 50 MCG/ACT nasal spray Place 1 spray into both nostrils daily as needed for allergies or rhinitis.     levothyroxine (SYNTHROID) 175 MCG tablet TAKE 1 TABLET BY MOUTH EVERY DAY 90 tablet 3   pravastatin (PRAVACHOL) 20 MG tablet TAKE 1 TABLET BY MOUTH EVERY DAY 90 tablet 3   raloxifene (EVISTA) 60 MG tablet TAKE 1 TABLET BY MOUTH EVERY DAY 90 tablet 4   sertraline (ZOLOFT) 50 MG tablet Take 1 tablet (50 mg total) by mouth daily. 90 tablet 1   valsartan (DIOVAN) 160 MG tablet Take 1 tablet (160 mg total) by mouth daily. 90 tablet 2   carbamazepine (CARBATROL) 200 MG 12 hr capsule TAKE 1 CAPSULE BY MOUTH TWICE A DAY 180 capsule 4   No facility-administered medications prior to visit.    PAST MEDICAL HISTORY: Past Medical History:  Diagnosis Date   Arthritis    Breast cancer ([place]) 06/28/2017   Grade 3, DCIS, ER/ PR  90%. Right upper outer quadrant.   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 Bary Castilla   BREAST LUMPECTOMY Right 07/17/2017    DCIS mammosite   CATARACT EXTRACTION     CESAREAN SECTION     COLONOSCOPY N/A 10/05/2016   Procedure: COLONOSCOPY;  Surgeon: Manya Silvas, MD;  Location: [facility] ENDOSCOPY;  Service: Endoscopy;  Laterality: N/A;  Zosyn IVPB   COLONOSCOPY WITH PROPOFOL N/A 11/10/2021   Procedure: COLONOSCOPY WITH PROPOFOL;  Surgeon: Robert Bellow, MD;  Location: [facility] ENDOSCOPY;  Service: Endoscopy;  Laterality: N/A;   JOINT REPLACEMENT     MASTECTOMY, PARTIAL Right 07/17/2017   Procedure: MASTECTOMY PARTIAL;  Surgeon: Robert Bellow, MD;  Location: [facility] ORS;  Service: General;  Laterality: Right;   thumb surgery Right 2005   AND CTR   THYROIDECTOMY  1988   TOTAL KNEE ARTHROPLASTY Left 2012    FAMILY HISTORY: Family History  Problem Relation Age of Onset   Diabetes Father    Dementia Mother    Breast cancer Neg Hx    Colon cancer Neg Hx     SOCIAL HISTORY: Social History   Socioeconomic History   Marital status: Married    Spouse name: Not on file   Number of children: 1   Years of education: Not on file   Highest education level: Not on file  Occupational History    Employer: [employer]  Tobacco Use   Smoking status: Some Days    Packs/day: 0.25    Years: 2.00    Pack years: 0.50    Types: Cigarettes    Last attempt to quit: 08/01/2017    Years since quitting: 4.4   Smokeless tobacco: Never   Tobacco comments:    PT ONLY SMOKES SOCIALLY-SHE MAY GO A FEW MONTHS WITHOUT EVER SMOKING   Vaping Use   Vaping Use: Never used  Substance and Sexual Activity   Alcohol use: Not Currently   Drug use: No   Sexual activity: Yes  Other Topics Concern   Not on file  Social History Narrative   Not on file   Social Determinants of Health   Financial Resource Strain: Not on file  Food Insecurity: Not on file  Transportation Needs: Not on file  Physical Activity: Not on file  Stress: Not on file  Social Connections: Not on file  Intimate Partner Violence: Not on file    PHYSICAL EXAM  Vitals:   01/13/22 1115  BP: (!) 152/106  Pulse: 90  Weight: 241 lb 8 oz (109.5 kg)  Height: 5\' 9"  (1.753 m)    Body mass index is 35.66 kg/m.  Generalized: Well developed, in no acute distress   Neurological examination  Mentation: Alert oriented to time, place, history taking. Follows all commands speech and language fluent Cranial nerve II-XII: Pupils were equal round reactive to light. Extraocular movements were full, visual field were full on confrontational test. Facial sensation and strength were normal. Head turning and shoulder shrug  were normal and  symmetric. Motor: The motor testing reveals 5 over 5 strength of all 4 extremities. Good symmetric motor tone is noted throughout.  Sensory: Sensory testing is intact to soft touch on all 4 extremities. No evidence of extinction is noted.  Coordination: Cerebellar testing reveals good finger-nose-finger and heel-to-shin bilaterally.  Gait and station: Gait is normal. Tandem gait is normal.  Reflexes: Deep tendon reflexes are symmetric and normal bilaterally.   DIAGNOSTIC DATA (LABS, IMAGING, TESTING) - I reviewed patient records, labs, notes, testing and imaging myself where available.  Lab Results  Component Value Date   WBC 5.6 12/14/2021   HGB 13.9 12/14/2021   HCT 41.5 12/14/2021   MCV 92.0 12/14/2021   PLT 249.0 12/14/2021      Component Value Date/Time   NA 140 12/14/2021 0849   K 3.9 12/14/2021 0849   CL 102 12/14/2021 0849   CO2 31 12/14/2021 0849   GLUCOSE 106 (H) 12/14/2021 0849   BUN 18 12/14/2021 0849   CREATININE 0.80 12/14/2021 0849   CALCIUM 9.2 12/14/2021 0849   PROT 7.1 12/14/2021 0849   ALBUMIN 4.5 12/14/2021 0849   AST 13 12/14/2021 0849   ALT 18 12/14/2021 0849   ALKPHOS 66 12/14/2021 0849   BILITOT 0.5 12/14/2021 0849   GFRNONAA >60 08/12/2011 0512   GFRAA >60 08/12/2011 0512   Lab Results  Component Value Date   CHOL 210 (H) 12/14/2021   HDL 59.50 12/14/2021    LDLCALC 117 (H) 12/14/2021   LDLDIRECT 160.0 07/23/2020   TRIG 164.0 (H) 12/14/2021   CHOLHDL 4 12/14/2021   Lab Results  Component Value Date   HGBA1C 5.4 12/30/2020   Lab Results  Component Value Date   VITAMINB12 >1500 (H) 06/05/2014   Lab Results  Component Value Date   TSH 0.73 07/13/2021    ASSESSMENT AND PLAN 61 y.o. year old female  has a past medical history of Arthritis, Breast cancer ([place]) (06/28/2017), Hypercholesterolemia, Hypertension, Hypothyroidism, Osteoarthritis, Seizures ([place]), Sleep apnea, and Ulcer. here with:  1.  Seizures  -Remains under excellent control, last was in 2000, her seizure history has consisted of 1 generalized seizure, the rest were focal aware seizures hearing music at the time of her menstrual cycle -For now, she will continue on seizure medication, we may be able to consider a wean off in the future, but right now she commutes 80 miles a day for work  -Continue Carbatrol 200 mg twice a day -I was able to review recent labs from PCP -Call for seizure activity, otherwise follow-up in 1 year or sooner if needed, 15 minute VV  Butler Denmark, [place], DNP 01/13/2022, 11:36 AM [facility] [address] [place], [place] [address] [phone]

## 2022-01-17 ENCOUNTER — Ambulatory Visit: Payer: BC Managed Care – PPO | Admitting: Internal Medicine

## 2022-01-17 ENCOUNTER — Other Ambulatory Visit: Payer: Self-pay

## 2022-01-17 DIAGNOSIS — E039 Hypothyroidism, unspecified: Secondary | ICD-10-CM

## 2022-01-17 DIAGNOSIS — D0511 Intraductal carcinoma in situ of right breast: Secondary | ICD-10-CM

## 2022-01-17 DIAGNOSIS — G40909 Epilepsy, unspecified, not intractable, without status epilepticus: Secondary | ICD-10-CM

## 2022-01-17 DIAGNOSIS — G4733 Obstructive sleep apnea (adult) (pediatric): Secondary | ICD-10-CM | POA: Diagnosis not present

## 2022-01-17 DIAGNOSIS — E7800 Pure hypercholesterolemia, unspecified: Secondary | ICD-10-CM

## 2022-01-17 DIAGNOSIS — F439 Reaction to severe stress, unspecified: Secondary | ICD-10-CM

## 2022-01-17 DIAGNOSIS — I10 Essential (primary) hypertension: Secondary | ICD-10-CM

## 2022-01-17 DIAGNOSIS — Z1211 Encounter for screening for malignant neoplasm of colon: Secondary | ICD-10-CM

## 2022-01-17 NOTE — Progress Notes (Signed)
Patient ID: Jennifer Pratt, female   DOB: [birth year], 61 y.o.   MRN: [id number]   Subjective:    Patient ID: Jennifer Pratt, female    DOB: 12/22/1960, 61 y.o.   MRN: [id number]  This visit occurred during the SARS-CoV-2 public health emergency.  Safety protocols were in place, including screening questions prior to the visit, additional usage of staff PPE, and extensive cleaning of exam room while observing appropriate contact time as indicated for disinfecting solutions.   Patient here for a scheduled follow up.   Chief Complaint  Patient presents with   Hyperlipidemia   Hypertension   .   HPI Reports she is doing relatively well.  Is s/p right THR 03/2020.  Also history of lumbar spondylosis - previous injections.  Some arthritis.  Takes voltaren bid.  Discussed possible side effects and risk of medication.  States needs to function.  No GI upset.  Tries to stay active.  No chest pain or sob reported.  No increased cough or congestion.  No abdominal pain or bowel change.  Did fall off porch - 11/19/21.  Landed on elbow.  Has seen ortho.  Xray ok.  Also fell - 2/7 - tripped over rock.  No head injury.  Discussed recent falls.  Discussed recent labs.  Discussed increasing pravastatin. 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 Bary Castilla   BREAST LUMPECTOMY Right 07/17/2017   DCIS  mammosite   CATARACT EXTRACTION     CESAREAN SECTION     COLONOSCOPY N/A 10/05/2016   Procedure: COLONOSCOPY;  Surgeon: Manya Silvas, MD;  Location: [facility] ENDOSCOPY;  Service: Endoscopy;  Laterality: N/A;  Zosyn IVPB   COLONOSCOPY WITH PROPOFOL N/A 11/10/2021   Procedure: COLONOSCOPY WITH PROPOFOL;  Surgeon: Robert Bellow, MD;  Location: [facility] ENDOSCOPY;  Service: Endoscopy;  Laterality: N/A;   JOINT REPLACEMENT     MASTECTOMY, PARTIAL Right 07/17/2017   Procedure: MASTECTOMY PARTIAL;  Surgeon: Robert Bellow, MD;  Location: [facility] ORS;  Service: General;  Laterality: Right;   thumb surgery Right 2005   AND CTR   THYROIDECTOMY  1988   TOTAL KNEE ARTHROPLASTY Left 2012   Family History  Problem Relation Age of Onset   Diabetes Father    Dementia Mother    Breast cancer Neg Hx    Colon cancer Neg Hx    Social History   Socioeconomic History   Marital status: Married    Spouse name: Not on file   Number of children: 1   Years of education: Not on file   Highest education level: Not on file  Occupational History    Employer: [employer]  Tobacco Use   Smoking status: Some Days    Packs/day: 0.25    Years: 2.00  Pack years: 0.50    Types: Cigarettes    Last attempt to quit: 08/01/2017    Years since quitting: 4.4   Smokeless tobacco: Never   Tobacco comments:    PT ONLY SMOKES SOCIALLY-SHE MAY GO A FEW MONTHS WITHOUT EVER SMOKING   Vaping Use   Vaping Use: Never used  Substance and Sexual Activity   Alcohol use: Not Currently   Drug use: No   Sexual activity: Yes  Other Topics Concern   Not on file  Social History Narrative   Not on file   Social Determinants of Health   Financial Resource Strain: Not on file  Food Insecurity: Not on file  Transportation Needs: Not on file  Physical Activity: Not on file  Stress: Not on file  Social Connections: Not on file     Review of Systems  Constitutional:  Negative for appetite change and unexpected  weight change.  HENT:  Negative for congestion and sinus pressure.   Respiratory:  Negative for cough, chest tightness and shortness of breath.   Cardiovascular:  Negative for chest pain, palpitations and leg swelling.  Gastrointestinal:  Negative for abdominal pain, diarrhea, nausea and vomiting.  Genitourinary:  Negative for difficulty urinating and dysuria.  Musculoskeletal:  Negative for myalgias.       Joint pains as outlined.   Skin:  Negative for color change and rash.  Neurological:  Negative for dizziness, light-headedness and headaches.  Psychiatric/Behavioral:  Negative for agitation and dysphoric mood.       Objective:     BP 138/74    Pulse 89    Temp 97.8 F (36.6 C)    Resp 16    Ht 5\' 9"  (1.753 m)    Wt 241 lb (109.3 kg)    LMP 10/09/2012    SpO2 98%    BMI 35.59 kg/m  Wt Readings from Last 3 Encounters:  01/17/22 241 lb (109.3 kg)  01/13/22 241 lb 8 oz (109.5 kg)  11/10/21 235 lb (106.6 kg)    Physical Exam Vitals reviewed.  Constitutional:      General: She is not in acute distress.    Appearance: Normal appearance.  HENT:     Head: Normocephalic and atraumatic.     Right Ear: External ear normal.     Left Ear: External ear normal.  Eyes:     General: No scleral icterus.       Right eye: No discharge.        Left eye: No discharge.     Conjunctiva/sclera: Conjunctivae normal.  Neck:     Thyroid: No thyromegaly.  Cardiovascular:     Rate and Rhythm: Normal rate and regular rhythm.  Pulmonary:     Effort: No respiratory distress.     Breath sounds: Normal breath sounds. No wheezing.  Abdominal:     General: Bowel sounds are normal.     Palpations: Abdomen is soft.     Tenderness: There is no abdominal tenderness.  Musculoskeletal:        General: No swelling or tenderness.     Cervical back: Neck supple. No tenderness.  Lymphadenopathy:     Cervical: No cervical adenopathy.  Skin:    Findings: No erythema or rash.  Neurological:     Mental  Status: She is alert.  Psychiatric:        Mood and Affect: Mood normal.        Behavior: Behavior normal.     Outpatient Encounter Medications as  of 01/17/2022  Medication Sig   amLODipine (NORVASC) 5 MG tablet TAKE 1 TABLET BY MOUTH EVERY DAY   carbamazepine (CARBATROL) 200 MG 12 hr capsule TAKE 1 CAPSULE BY MOUTH TWICE A DAY   diclofenac (VOLTAREN) 75 MG EC tablet Take 1 tablet (75 mg total) by mouth 2 (two) times daily.   DULoxetine (CYMBALTA) 60 MG capsule TAKE 1 CAPSULE BY MOUTH EVERY DAY   fluticasone (FLONASE) 50 MCG/ACT nasal spray Place 1 spray into both nostrils daily as needed for allergies or rhinitis.   levothyroxine (SYNTHROID) 175 MCG tablet TAKE 1 TABLET BY MOUTH EVERY DAY   pravastatin (PRAVACHOL) 20 MG tablet TAKE 1 TABLET BY MOUTH EVERY DAY   raloxifene (EVISTA) 60 MG tablet TAKE 1 TABLET BY MOUTH EVERY DAY   sertraline (ZOLOFT) 50 MG tablet Take 1 tablet (50 mg total) by mouth daily.   valsartan (DIOVAN) 160 MG tablet Take 1 tablet (160 mg total) by mouth daily.   No facility-administered encounter medications on file as of 01/17/2022.     Lab Results  Component Value Date   WBC 5.6 12/14/2021   HGB 13.9 12/14/2021   HCT 41.5 12/14/2021   PLT 249.0 12/14/2021   GLUCOSE 106 (H) 12/14/2021   CHOL 210 (H) 12/14/2021   TRIG 164.0 (H) 12/14/2021   HDL 59.50 12/14/2021   LDLDIRECT 160.0 07/23/2020   LDLCALC 117 (H) 12/14/2021   ALT 18 12/14/2021   AST 13 12/14/2021   NA 140 12/14/2021   K 3.9 12/14/2021   CL 102 12/14/2021   CREATININE 0.80 12/14/2021   BUN 18 12/14/2021   CO2 31 12/14/2021   TSH 0.73 07/13/2021   INR 1.0 04/03/2020   HGBA1C 5.4 12/30/2020       Assessment & Plan:   Problem List Items Addressed This Visit     Colon cancer screening    Colonoscopy 10/2021 - diverticulosis.  Recommended f/u colonoscopy in 8 years.       Ductal carcinoma in situ (DCIS) of right breast    Mammogram 08/25/2020 BI-RADS 2.  Has seen Dr. Bary Castilla -  09/22/21 - recommended f/u screening mammogram in 1 year.      Essential hypertension, benign    Continue diovan and amlodipine.  Blood pressure as outlined.  Elevated above goal.  Have her spot check her pressures.  Send in readings.  Follow pressures.  Follow metabolic panel. Hold on changing medication today.        Hypercholesterolemia    On pravastatin.  Low-cholesterol diet and exercise.  Follow lipid panel liver function test. Desires not to increase pravastatin to 40mg  at this time.  Wants to work on diet and exercise.        Hypothyroidism    On thyroid replacement.  Follow TSH.      Obstructive sleep apnea    CPAP      Seizure disorder (HCC)    Remains on Carbatrol.  Follow levels.  Carbamazepine level 12/14/21 - 5.5. saw neurology.  No changes made. Follow CBC and liver function.  No recent seizures.      Stress    On Zoloft and Cymbalta.  Stable.  Appears to be doing well.          Einar Pheasant, MD

## 2022-01-18 ENCOUNTER — Encounter: Payer: Self-pay | Admitting: Internal Medicine

## 2022-01-18 DIAGNOSIS — Z1211 Encounter for screening for malignant neoplasm of colon: Secondary | ICD-10-CM | POA: Insufficient documentation

## 2022-01-18 NOTE — Assessment & Plan Note (Signed)
Remains on Carbatrol.  Follow levels.  Carbamazepine level 12/14/21 - 5.5. saw neurology.  No changes made. Follow CBC and liver function.  No recent seizures. 

## 2022-01-18 NOTE — Assessment & Plan Note (Signed)
Mammogram 08/25/2020 BI-RADS 2.  Has seen Dr. Bary Castilla - 09/22/21 - recommended f/u screening mammogram in 1 year.

## 2022-01-18 NOTE — Assessment & Plan Note (Signed)
On pravastatin.  Low-cholesterol diet and exercise.  Follow lipid panel liver function test. Desires not to increase pravastatin to 40mg  at this time.  Wants to work on diet and exercise.

## 2022-01-18 NOTE — Assessment & Plan Note (Signed)
On Zoloft and Cymbalta.  Stable.  Appears to be doing well.

## 2022-01-18 NOTE — Assessment & Plan Note (Signed)
On thyroid replacement.  Follow TSH 

## 2022-01-18 NOTE — Assessment & Plan Note (Signed)
Continue diovan and amlodipine.  Blood pressure as outlined.  Elevated above goal.  Have her spot check her pressures.  Send in readings.  Follow pressures.  Follow metabolic panel. Hold on changing medication today.

## 2022-01-18 NOTE — Assessment & Plan Note (Signed)
CPAP.  

## 2022-01-18 NOTE — Assessment & Plan Note (Signed)
Colonoscopy 10/2021 - diverticulosis.  Recommended f/u colonoscopy in 8 years.

## 2022-02-10 DIAGNOSIS — L821 Other seborrheic keratosis: Secondary | ICD-10-CM | POA: Diagnosis not present

## 2022-02-10 DIAGNOSIS — L298 Other pruritus: Secondary | ICD-10-CM | POA: Diagnosis not present

## 2022-02-10 DIAGNOSIS — L578 Other skin changes due to chronic exposure to nonionizing radiation: Secondary | ICD-10-CM | POA: Diagnosis not present

## 2022-02-10 DIAGNOSIS — D225 Melanocytic nevi of trunk: Secondary | ICD-10-CM | POA: Diagnosis not present

## 2022-02-22 DIAGNOSIS — B300 Keratoconjunctivitis due to adenovirus: Secondary | ICD-10-CM | POA: Diagnosis not present

## 2022-04-01 ENCOUNTER — Ambulatory Visit: Payer: BC Managed Care – PPO | Admitting: Internal Medicine

## 2022-04-11 ENCOUNTER — Other Ambulatory Visit: Payer: Self-pay | Admitting: Internal Medicine

## 2022-06-10 ENCOUNTER — Other Ambulatory Visit: Payer: Self-pay | Admitting: Internal Medicine

## 2022-07-14 ENCOUNTER — Other Ambulatory Visit: Payer: Self-pay | Admitting: Internal Medicine

## 2022-07-14 DIAGNOSIS — Z1231 Encounter for screening mammogram for malignant neoplasm of breast: Secondary | ICD-10-CM

## 2022-07-15 ENCOUNTER — Other Ambulatory Visit: Payer: Self-pay | Admitting: Internal Medicine

## 2022-08-17 DIAGNOSIS — Z85820 Personal history of malignant melanoma of skin: Secondary | ICD-10-CM | POA: Diagnosis not present

## 2022-08-17 DIAGNOSIS — Z85828 Personal history of other malignant neoplasm of skin: Secondary | ICD-10-CM | POA: Diagnosis not present

## 2022-08-17 DIAGNOSIS — L578 Other skin changes due to chronic exposure to nonionizing radiation: Secondary | ICD-10-CM | POA: Diagnosis not present

## 2022-08-17 DIAGNOSIS — D225 Melanocytic nevi of trunk: Secondary | ICD-10-CM | POA: Diagnosis not present

## 2022-08-29 ENCOUNTER — Ambulatory Visit
Admission: RE | Admit: 2022-08-29 | Discharge: 2022-08-29 | Disposition: A | Discharge status: Home / Self Care | Payer: BC Managed Care – PPO | Source: Ambulatory Visit | Attending: Internal Medicine | Admitting: Internal Medicine

## 2022-08-29 DIAGNOSIS — Z1231 Encounter for screening mammogram for malignant neoplasm of breast: Secondary | ICD-10-CM | POA: Insufficient documentation

## 2022-09-15 ENCOUNTER — Encounter: Payer: Self-pay | Admitting: Internal Medicine

## 2022-09-15 ENCOUNTER — Ambulatory Visit (INDEPENDENT_AMBULATORY_CARE_PROVIDER_SITE_OTHER): Payer: BC Managed Care – PPO | Admitting: Internal Medicine

## 2022-09-15 VITALS — BP 138/74 | HR 87 | Temp 99.0°F | Ht 69.0 in | Wt 236.6 lb

## 2022-09-15 DIAGNOSIS — Z0000 Encounter for general adult medical examination without abnormal findings: Secondary | ICD-10-CM

## 2022-09-15 DIAGNOSIS — G40909 Epilepsy, unspecified, not intractable, without status epilepticus: Secondary | ICD-10-CM

## 2022-09-15 DIAGNOSIS — E7800 Pure hypercholesterolemia, unspecified: Secondary | ICD-10-CM | POA: Diagnosis not present

## 2022-09-15 DIAGNOSIS — I10 Essential (primary) hypertension: Secondary | ICD-10-CM

## 2022-09-15 DIAGNOSIS — G4733 Obstructive sleep apnea (adult) (pediatric): Secondary | ICD-10-CM

## 2022-09-15 DIAGNOSIS — D0511 Intraductal carcinoma in situ of right breast: Secondary | ICD-10-CM

## 2022-09-15 DIAGNOSIS — F439 Reaction to severe stress, unspecified: Secondary | ICD-10-CM

## 2022-09-15 DIAGNOSIS — E039 Hypothyroidism, unspecified: Secondary | ICD-10-CM

## 2022-09-15 LAB — LIPID PANEL
Cholesterol: 192 mg/dL (ref 0–200)
HDL: 57.9 mg/dL (ref >39.00)
LDL Cholesterol: 98 mg/dL (ref 0–99)
NonHDL: 134.43
Total CHOL/HDL Ratio: 3
Triglycerides: 183 mg/dL — ABNORMAL HIGH (ref 0.0–149.0)
VLDL: 36.6 mg/dL (ref 0.0–40.0)

## 2022-09-15 LAB — CBC WITH DIFFERENTIAL/PLATELET
Basophils Absolute: 0.1 10*3/uL (ref 0.0–0.1)
Basophils Relative: 1.2 % (ref 0.0–3.0)
Eosinophils Absolute: 0.1 10*3/uL (ref 0.0–0.7)
Eosinophils Relative: 1.9 % (ref 0.0–5.0)
HCT: 41.6 % (ref 36.0–46.0)
Hemoglobin: 13.7 g/dL (ref 12.0–15.0)
Lymphocytes Relative: 35.8 % (ref 12.0–46.0)
Lymphs Abs: 1.9 10*3/uL (ref 0.7–4.0)
MCHC: 33 g/dL (ref 30.0–36.0)
MCV: 91.8 fl (ref 78.0–100.0)
Monocytes Absolute: 0.3 10*3/uL (ref 0.1–1.0)
Monocytes Relative: 6.4 % (ref 3.0–12.0)
Neutro Abs: 2.9 10*3/uL (ref 1.4–7.7)
Neutrophils Relative %: 54.7 % (ref 43.0–77.0)
Platelets: 237 10*3/uL (ref 150.0–400.0)
RBC: 4.53 Mil/uL (ref 3.87–5.11)
RDW: 13.2 % (ref 11.5–15.5)
WBC: 5.3 10*3/uL (ref 4.0–10.5)

## 2022-09-15 LAB — HEPATIC FUNCTION PANEL
ALT: 18 U/L (ref 0–35)
AST: 16 U/L (ref 0–37)
Albumin: 4.4 g/dL (ref 3.5–5.2)
Alkaline Phosphatase: 64 U/L (ref 39–117)
Bilirubin, Direct: 0.1 mg/dL (ref 0.0–0.3)
Total Bilirubin: 0.4 mg/dL (ref 0.2–1.2)
Total Protein: 6.9 g/dL (ref 6.0–8.3)

## 2022-09-15 LAB — BASIC METABOLIC PANEL
BUN: 17 mg/dL (ref 6–23)
CO2: 30 mEq/L (ref 19–32)
Calcium: 9.2 mg/dL (ref 8.4–10.5)
Chloride: 104 mEq/L (ref 96–112)
Creatinine, Ser: 0.83 mg/dL (ref 0.40–1.20)
GFR: 76.51 mL/min (ref >60.00)
Glucose, Bld: 103 mg/dL — ABNORMAL HIGH (ref 70–99)
Potassium: 3.7 mEq/L (ref 3.5–5.1)
Sodium: 141 mEq/L (ref 135–145)

## 2022-09-15 LAB — TSH: TSH: 0.28 u[IU]/mL — ABNORMAL LOW (ref 0.35–5.50)

## 2022-09-15 NOTE — Assessment & Plan Note (Signed)
Physical today 09/15/22.  Mammogram 08/29/22 - Birads I.  Recommended diagnostic mammogram in one year. Colonoscopy 11/10/21 - diverticulosis.  Recommended f/u in 8 years (Dr Bary Castilla).  PAP 05/2019 - negative with negative HPV - benign repair changes.  Repeat pap 07/15/20 - negative (atrophy) with negative HPV.

## 2022-09-15 NOTE — Progress Notes (Signed)
Patient ID: Jennifer Pratt, female   DOB: [birth year], 61 y.o.   MRN: [id number]   Subjective:    Patient ID: Jennifer Pratt, female    DOB: 09/30/1961, 61 y.o.   MRN: [id number]   Patient here for  Chief Complaint  Patient presents with   Annual Exam   .   HPI Here for a physical exam.  Is s/p right THR 03/2020.  Also history of lumbar spondylosis - previous injections.  Some arthritis.  On zoloft and cymbalta.  Increased stress.  Discussed.  No chest pain or sob reported.  No abdominal pain or bowel change reported.     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 Bary Castilla   BREAST LUMPECTOMY Right 07/17/2017   DCIS mammosite   CATARACT EXTRACTION     CESAREAN SECTION     COLONOSCOPY N/A 10/05/2016   Procedure: COLONOSCOPY;  Surgeon: Manya Silvas, MD;  Location: [facility] ENDOSCOPY;  Service: Endoscopy;  Laterality: N/A;  Zosyn IVPB   COLONOSCOPY WITH PROPOFOL N/A 11/10/2021   Procedure: COLONOSCOPY WITH PROPOFOL;  Surgeon: Robert Bellow, MD;  Location: [facility] ENDOSCOPY;  Service: Endoscopy;  Laterality: N/A;   JOINT REPLACEMENT     MASTECTOMY, PARTIAL Right 07/17/2017   Procedure: MASTECTOMY PARTIAL;  Surgeon: Robert Bellow, MD;  Location: [facility] ORS;  Service: General;  Laterality: Right;   thumb surgery Right 2005   AND CTR   THYROIDECTOMY  1988   TOTAL KNEE ARTHROPLASTY Left 2012   Family History  Problem Relation Age of Onset    Diabetes Father    Dementia Mother    Breast cancer Neg Hx    Colon cancer Neg Hx    Social History   Socioeconomic History   Marital status: Married    Spouse name: Not on file   Number of children: 1   Years of education: Not on file   Highest education level: Not on file  Occupational History    Employer: [employer]  Tobacco Use   Smoking status: Some Days    Packs/day: 0.25    Years: 2.00    Total pack years: 0.50    Types: Cigarettes    Last attempt to quit: 08/01/2017    Years since quitting: 5.1   Smokeless tobacco: Never   Tobacco comments:    PT ONLY SMOKES SOCIALLY-SHE MAY GO A FEW MONTHS WITHOUT EVER SMOKING   Vaping Use   Vaping Use: Never used  Substance and Sexual Activity   Alcohol use: Not Currently   Drug use: No   Sexual activity: Yes  Other Topics Concern   Not on file  Social History Narrative   Not on file   Social Determinants of Health   Financial Resource Strain: Not on file  Food Insecurity: Not on file  Transportation Needs: Not on file  Physical  Activity: Not on file  Stress: Not on file  Social Connections: Not on file     Review of Systems  Constitutional:  Negative for appetite change and unexpected weight change.  HENT:  Negative for congestion, sinus pressure and sore throat.   Eyes:  Negative for pain and visual disturbance.  Respiratory:  Negative for cough, chest tightness and shortness of breath.   Cardiovascular:  Negative for chest pain, palpitations and leg swelling.  Gastrointestinal:  Negative for abdominal pain, diarrhea, nausea and vomiting.  Genitourinary:  Negative for difficulty urinating and dysuria.  Musculoskeletal:  Negative for joint swelling and myalgias.  Skin:  Negative for color change and rash.  Neurological:  Negative for dizziness and headaches.  Hematological:  Negative for adenopathy. Does not bruise/bleed easily.  Psychiatric/Behavioral:  Negative for agitation and dysphoric mood.         Objective:     BP 138/74 (BP Location: Left Arm, Patient Position: Sitting, Cuff Size: Normal)   Pulse 87   Temp 99 F (37.2 C) (Oral)   Ht '5\' 9"'$  (1.753 m)   Wt 236 lb 9.6 oz (107.3 kg)   LMP 10/09/2012   SpO2 95%   BMI 34.94 kg/m  Wt Readings from Last 3 Encounters:  09/15/22 236 lb 9.6 oz (107.3 kg)  01/17/22 241 lb (109.3 kg)  01/13/22 241 lb 8 oz (109.5 kg)    Physical Exam Vitals reviewed.  Constitutional:      General: She is not in acute distress.    Appearance: Normal appearance. She is well-developed.  HENT:     Head: Normocephalic and atraumatic.     Right Ear: External ear normal.     Left Ear: External ear normal.  Eyes:     General: No scleral icterus.       Right eye: No discharge.        Left eye: No discharge.     Conjunctiva/sclera: Conjunctivae normal.  Neck:     Thyroid: No thyromegaly.  Cardiovascular:     Rate and Rhythm: Normal rate and regular rhythm.  Pulmonary:     Effort: No tachypnea, accessory muscle usage or respiratory distress.     Breath sounds: Normal breath sounds. No decreased breath sounds or wheezing.  Chest:  Breasts:    Right: No inverted nipple, mass, nipple discharge or tenderness (no axillary adenopathy).     Left: No inverted nipple, mass, nipple discharge or tenderness (no axilarry adenopathy).  Abdominal:     General: Bowel sounds are normal.     Palpations: Abdomen is soft.     Tenderness: There is no abdominal tenderness.  Musculoskeletal:        General: No swelling or tenderness.     Cervical back: Neck supple.  Lymphadenopathy:     Cervical: No cervical adenopathy.  Skin:    Findings: No erythema or rash.  Neurological:     Mental Status: She is alert and oriented to person, place, and time.  Psychiatric:        Mood and Affect: Mood normal.        Behavior: Behavior normal.      Outpatient Encounter Medications as of 09/15/2022  Medication Sig   carbamazepine (CARBATROL) 200 MG 12 hr capsule TAKE 1  CAPSULE BY MOUTH TWICE A DAY   diclofenac (VOLTAREN) 75 MG EC tablet Take 1 tablet (75 mg total) by mouth 2 (two) times daily.   DULoxetine (CYMBALTA) 60 MG capsule TAKE 1 CAPSULE BY MOUTH EVERY  DAY   fluticasone (FLONASE) 50 MCG/ACT nasal spray Place 1 spray into both nostrils daily as needed for allergies or rhinitis.   pravastatin (PRAVACHOL) 20 MG tablet TAKE 1 TABLET BY MOUTH EVERY DAY   raloxifene (EVISTA) 60 MG tablet TAKE 1 TABLET BY MOUTH EVERY DAY   sertraline (ZOLOFT) 50 MG tablet TAKE 1 TABLET BY MOUTH EVERY DAY   valsartan (DIOVAN) 160 MG tablet TAKE 1 TABLET BY MOUTH EVERY DAY   [DISCONTINUED] levothyroxine (SYNTHROID) 175 MCG tablet TAKE 1 TABLET BY MOUTH EVERY DAY   amLODipine (NORVASC) 5 MG tablet TAKE 1 TABLET BY MOUTH EVERY DAY (Patient not taking: Reported on 09/15/2022)   No facility-administered encounter medications on file as of 09/15/2022.     Lab Results  Component Value Date   WBC 5.3 09/15/2022   HGB 13.7 09/15/2022   HCT 41.6 09/15/2022   PLT 237.0 09/15/2022   GLUCOSE 103 (H) 09/15/2022   CHOL 192 09/15/2022   TRIG 183.0 (H) 09/15/2022   HDL 57.90 09/15/2022   LDLDIRECT 160.0 07/23/2020   LDLCALC 98 09/15/2022   ALT 18 09/15/2022   AST 16 09/15/2022   NA 141 09/15/2022   K 3.7 09/15/2022   CL 104 09/15/2022   CREATININE 0.83 09/15/2022   BUN 17 09/15/2022   CO2 30 09/15/2022   TSH 0.28 (L) 09/15/2022   INR 1.0 04/03/2020   HGBA1C 5.4 12/30/2020    MM 3D SCREEN BREAST BILATERAL  Result Date: 08/30/2022 CLINICAL DATA:  Screening. EXAM: DIGITAL SCREENING BILATERAL MAMMOGRAM WITH TOMOSYNTHESIS AND CAD TECHNIQUE: Bilateral screening digital craniocaudal and mediolateral oblique mammograms were obtained. Bilateral screening digital breast tomosynthesis was performed. The images were evaluated with computer-aided detection. COMPARISON:  Previous exam(s). ACR Breast Density Category b: There are scattered areas of fibroglandular density. FINDINGS: There  are no findings suspicious for malignancy. IMPRESSION: No mammographic evidence of malignancy. A result letter of this screening mammogram will be mailed directly to the patient. RECOMMENDATION: Screening mammogram in one year. (Code:SM-B-01Y) BI-RADS CATEGORY  1: Negative. Electronically Signed   By: Ammie Ferrier M.D.   On: 08/30/2022 16:08       Assessment & Plan:   Problem List Items Addressed This Visit     Ductal carcinoma in situ (DCIS) of right breast    Mammogram 08/29/22 - Bir[facility] I.  Recommended diagnostic mammogram in one year.      Essential hypertension, benign    Continue diovan. Reported off amlodipine.  Blood pressure as outlined.  Elevated above goal.  Have her spot check her pressures.  Send in readings.  Follow pressures.  Follow metabolic panel.       Relevant Orders   Basic metabolic panel (Completed)   Health care maintenance    Physical today 09/15/22.  Mammogram 08/29/22 - Birads I.  Recommended diagnostic mammogram in one year. Colonoscopy 11/10/21 - diverticulosis.  Recommended f/u in 8 years (Dr Bary Castilla).  PAP 05/2019 - negative with negative HPV - benign repair changes.  Repeat pap 07/15/20 - negative (atrophy) with negative HPV.        Hypercholesterolemia    On pravastatin.  Low-cholesterol diet and exercise.  Follow lipid panel liver function test. Wants to work on diet and exercise.        Relevant Orders   CBC with Differential/Platelet (Completed)   Hepatic function panel (Completed)   Lipid panel (Completed)   Hypothyroidism    On thyroid replacement.  Follow TSH.      Relevant Orders  TSH (Completed)   Obstructive sleep apnea    CPAP.       Seizure disorder ([place])    Remains on Carbatrol.  Follow levels.  Carbamazepine level 12/14/21 - 5.5. saw neurology.  No changes made. Follow CBC and liver function.  No recent seizures.      Relevant Orders   Carbamazepine Level (Tegretol), total (Completed)   Stress    On Zoloft and Cymbalta.   Hold on making changes.  Follow.       Other Visit Diagnoses     Routine general medical examination at a health care facility    -  Primary        Einar Pheasant, MD

## 2022-09-16 LAB — CARBAMAZEPINE LEVEL, TOTAL: Carbamazepine Lvl: 7.1 mg/L (ref 4.0–12.0)

## 2022-09-19 ENCOUNTER — Other Ambulatory Visit: Payer: Self-pay

## 2022-09-19 MED ORDER — LEVOTHYROXINE SODIUM 150 MCG PO TABS: 150.0000 ug | ORAL_TABLET | Freq: Every day | ORAL | 3 refills | Status: DC

## 2022-09-25 ENCOUNTER — Encounter: Payer: Self-pay | Admitting: Internal Medicine

## 2022-09-25 NOTE — Assessment & Plan Note (Signed)
Continue diovan. Reported off amlodipine.  Blood pressure as outlined.  Elevated above goal.  Have her spot check her pressures.  Send in readings.  Follow pressures.  Follow metabolic panel.

## 2022-09-25 NOTE — Assessment & Plan Note (Signed)
CPAP.  

## 2022-09-25 NOTE — Assessment & Plan Note (Signed)
Remains on Carbatrol.  Follow levels.  Carbamazepine level 12/14/21 - 5.5. saw neurology.  No changes made. Follow CBC and liver function.  No recent seizures.

## 2022-09-25 NOTE — Assessment & Plan Note (Signed)
On pravastatin.  Low-cholesterol diet and exercise.  Follow lipid panel liver function test. Wants to work on diet and exercise.

## 2022-09-25 NOTE — Assessment & Plan Note (Signed)
Mammogram 08/29/22 - Birads I.  Recommended diagnostic mammogram in one year.

## 2022-09-25 NOTE — Assessment & Plan Note (Signed)
On Zoloft and Cymbalta.  Hold on making changes.  Follow.

## 2022-09-25 NOTE — Assessment & Plan Note (Signed)
On thyroid replacement.  Follow TSH 

## 2022-10-13 ENCOUNTER — Other Ambulatory Visit: Payer: Self-pay | Admitting: Internal Medicine

## 2022-11-03 ENCOUNTER — Other Ambulatory Visit: Payer: Self-pay

## 2022-11-03 ENCOUNTER — Telehealth: Payer: Self-pay | Admitting: Internal Medicine

## 2022-11-03 DIAGNOSIS — E039 Hypothyroidism, unspecified: Secondary | ICD-10-CM

## 2022-11-03 NOTE — Telephone Encounter (Signed)
Patient has a lab appt 11/10/2022, there are no orders in.

## 2022-11-10 ENCOUNTER — Other Ambulatory Visit (INDEPENDENT_AMBULATORY_CARE_PROVIDER_SITE_OTHER): Payer: 59

## 2022-11-10 DIAGNOSIS — E039 Hypothyroidism, unspecified: Secondary | ICD-10-CM

## 2022-11-10 LAB — TSH: TSH: 0.78 u[IU]/mL (ref 0.35–5.50)

## 2022-12-11 ENCOUNTER — Other Ambulatory Visit: Payer: Self-pay | Admitting: Internal Medicine

## 2022-12-28 ENCOUNTER — Other Ambulatory Visit: Payer: Self-pay | Admitting: Internal Medicine

## 2023-01-17 ENCOUNTER — Encounter: Payer: Self-pay | Admitting: Internal Medicine

## 2023-01-17 MED ORDER — RALOXIFENE HCL 60 MG PO TABS: 60.0000 mg | ORAL_TABLET | Freq: Every day | ORAL | 1 refills | Status: DC

## 2023-01-17 NOTE — Telephone Encounter (Signed)
[facility] refilled. #90 with one refill

## 2023-01-17 NOTE — Progress Notes (Unsigned)
   Virtual Visit via Video Note  I connected with Jennifer Pratt on 01/17/23 at  2:15 PM EST by a video enabled telemedicine application and verified that I am speaking with the correct person using two identifiers.  Location: Patient: at her work  Provider: in the office    I discussed the limitations of evaluation and management by telemedicine and the availability of in person appointments. The patient expressed understanding and agreed to proceed.  History of Present Illness: 01/18/23 SS: She is doing well today. No seizures. Remains on Carbatrol.  No new issues or concerns.  PCP checked labs in October 2023 carbamazepine level 7.1.  Her Synthroid has been adjusted, otherwise no new health issues.  01/13/22 SS: Jennifer Pratt is here today for follow-up for seizures. Well controlled on Carbatrol. No seizures since starting the medication around 2000. Has had 1 grand mal seizures, the rest were focal aware seizures, hear music playing for 1 minute always around her menstrual cycle. Carbamazepine level was 5.5 in Jan 2023, CBC, BMP, LFT unremarkable. Still commuting for work 80 miles.    Observations/Objective: Via virtual visit, is alert and oriented, speech is clear and concise, moves about freely  Assessment and Plan: 1.  Seizures -Remains under excellent control, last seizure was in 2000 -Continue Carbatrol 200 mg twice a day -I reviewed labs from PCP  Follow Up Instructions: 1 year video visit   I discussed the assessment and treatment plan with the patient. The patient was provided an opportunity to ask questions and all were answered. The patient agreed with the plan and demonstrated an understanding of the instructions.   The patient was advised to call back or seek an in-person evaluation if the symptoms worsen or if the condition fails to improve as anticipated.  Evangeline Dakin, DNP  [facility] [address], [place], [place] [address] [phone]

## 2023-01-18 ENCOUNTER — Telehealth: Payer: 59 | Admitting: Neurology

## 2023-01-18 DIAGNOSIS — G40909 Epilepsy, unspecified, not intractable, without status epilepticus: Secondary | ICD-10-CM | POA: Diagnosis not present

## 2023-01-18 MED ORDER — CARBAMAZEPINE ER 200 MG PO CP12: ORAL_CAPSULE | ORAL | 4 refills | Status: DC

## 2023-01-18 NOTE — Patient Instructions (Signed)
I am glad you are doing well! We will continue current medication I will see you back in 1 year  Meds ordered this encounter  Medications   carbamazepine (CARBATROL) 200 MG 12 hr capsule    Sig: TAKE 1 CAPSULE BY MOUTH TWICE A DAY    Dispense:  180 capsule    Refill:  4

## 2023-03-30 ENCOUNTER — Other Ambulatory Visit: Payer: Self-pay | Admitting: Internal Medicine

## 2023-06-14 ENCOUNTER — Other Ambulatory Visit: Payer: Self-pay | Admitting: Internal Medicine

## 2023-06-25 ENCOUNTER — Other Ambulatory Visit: Payer: Self-pay | Admitting: Internal Medicine

## 2023-07-18 ENCOUNTER — Other Ambulatory Visit: Payer: Self-pay | Admitting: Internal Medicine

## 2023-07-18 DIAGNOSIS — Z1231 Encounter for screening mammogram for malignant neoplasm of breast: Secondary | ICD-10-CM

## 2023-09-01 ENCOUNTER — Ambulatory Visit
Admission: RE | Admit: 2023-09-01 | Discharge: 2023-09-01 | Disposition: A | Discharge status: Home / Self Care | Payer: 59 | Source: Ambulatory Visit | Attending: Internal Medicine | Admitting: Internal Medicine

## 2023-09-01 DIAGNOSIS — Z1231 Encounter for screening mammogram for malignant neoplasm of breast: Secondary | ICD-10-CM | POA: Diagnosis present

## 2023-09-20 ENCOUNTER — Encounter: Payer: Self-pay | Admitting: Internal Medicine

## 2023-09-20 ENCOUNTER — Other Ambulatory Visit (HOSPITAL_COMMUNITY)
Admission: RE | Admit: 2023-09-20 | Discharge: 2023-09-20 | Disposition: A | Discharge status: Home / Self Care | Payer: 59 | Source: Ambulatory Visit | Attending: Internal Medicine | Admitting: Internal Medicine

## 2023-09-20 ENCOUNTER — Ambulatory Visit: Payer: 59 | Admitting: Internal Medicine

## 2023-09-20 VITALS — BP 128/70 | HR 79 | Temp 98.0°F | Resp 16 | Ht 69.0 in | Wt 242.0 lb

## 2023-09-20 DIAGNOSIS — F419 Anxiety disorder, unspecified: Secondary | ICD-10-CM

## 2023-09-20 DIAGNOSIS — G40909 Epilepsy, unspecified, not intractable, without status epilepticus: Secondary | ICD-10-CM

## 2023-09-20 DIAGNOSIS — Z0000 Encounter for general adult medical examination without abnormal findings: Secondary | ICD-10-CM | POA: Diagnosis not present

## 2023-09-20 DIAGNOSIS — G4733 Obstructive sleep apnea (adult) (pediatric): Secondary | ICD-10-CM

## 2023-09-20 DIAGNOSIS — I10 Essential (primary) hypertension: Secondary | ICD-10-CM

## 2023-09-20 DIAGNOSIS — E039 Hypothyroidism, unspecified: Secondary | ICD-10-CM

## 2023-09-20 DIAGNOSIS — Z124 Encounter for screening for malignant neoplasm of cervix: Secondary | ICD-10-CM | POA: Diagnosis present

## 2023-09-20 DIAGNOSIS — E7800 Pure hypercholesterolemia, unspecified: Secondary | ICD-10-CM

## 2023-09-20 DIAGNOSIS — F439 Reaction to severe stress, unspecified: Secondary | ICD-10-CM

## 2023-09-20 DIAGNOSIS — E2839 Other primary ovarian failure: Secondary | ICD-10-CM

## 2023-09-20 DIAGNOSIS — D0511 Intraductal carcinoma in situ of right breast: Secondary | ICD-10-CM

## 2023-09-20 DIAGNOSIS — Z136 Encounter for screening for cardiovascular disorders: Secondary | ICD-10-CM | POA: Insufficient documentation

## 2023-09-20 LAB — HEPATIC FUNCTION PANEL
ALT: 19 U/L (ref 0–35)
AST: 14 U/L (ref 0–37)
Albumin: 4.4 g/dL (ref 3.5–5.2)
Alkaline Phosphatase: 73 U/L (ref 39–117)
Bilirubin, Direct: 0.1 mg/dL (ref 0.0–0.3)
Total Bilirubin: 0.4 mg/dL (ref 0.2–1.2)
Total Protein: 7 g/dL (ref 6.0–8.3)

## 2023-09-20 LAB — CBC WITH DIFFERENTIAL/PLATELET
Basophils Absolute: 0.1 10*3/uL (ref 0.0–0.1)
Basophils Relative: 2.3 % (ref 0.0–3.0)
Eosinophils Absolute: 0.1 10*3/uL (ref 0.0–0.7)
Eosinophils Relative: 2.5 % (ref 0.0–5.0)
HCT: 40.1 % (ref 36.0–46.0)
Hemoglobin: 13.1 g/dL (ref 12.0–15.0)
Lymphocytes Relative: 35.4 % (ref 12.0–46.0)
Lymphs Abs: 1.8 10*3/uL (ref 0.7–4.0)
MCHC: 32.8 g/dL (ref 30.0–36.0)
MCV: 92.7 fL (ref 78.0–100.0)
Monocytes Absolute: 0.3 10*3/uL (ref 0.1–1.0)
Monocytes Relative: 6 % (ref 3.0–12.0)
Neutro Abs: 2.7 10*3/uL (ref 1.4–7.7)
Neutrophils Relative %: 53.8 % (ref 43.0–77.0)
Platelets: 248 10*3/uL (ref 150.0–400.0)
RBC: 4.32 Mil/uL (ref 3.87–5.11)
RDW: 13.3 % (ref 11.5–15.5)
WBC: 5.1 10*3/uL (ref 4.0–10.5)

## 2023-09-20 LAB — BASIC METABOLIC PANEL
BUN: 20 mg/dL (ref 6–23)
CO2: 29 meq/L (ref 19–32)
Calcium: 9.4 mg/dL (ref 8.4–10.5)
Chloride: 104 meq/L (ref 96–112)
Creatinine, Ser: 0.88 mg/dL (ref 0.40–1.20)
GFR: 70.82 mL/min (ref >60.00)
Glucose, Bld: 101 mg/dL — ABNORMAL HIGH (ref 70–99)
Potassium: 3.9 meq/L (ref 3.5–5.1)
Sodium: 141 meq/L (ref 135–145)

## 2023-09-20 LAB — LIPID PANEL
Cholesterol: 261 mg/dL — ABNORMAL HIGH (ref 0–200)
HDL: 51.7 mg/dL (ref >39.00)
LDL Cholesterol: 157 mg/dL — ABNORMAL HIGH (ref 0–99)
NonHDL: 208.81
Total CHOL/HDL Ratio: 5
Triglycerides: 259 mg/dL — ABNORMAL HIGH (ref 0.0–149.0)
VLDL: 51.8 mg/dL — ABNORMAL HIGH (ref 0.0–40.0)

## 2023-09-20 LAB — TSH: TSH: 1.45 u[IU]/mL (ref 0.35–5.50)

## 2023-09-20 MED ORDER — SERTRALINE HCL 50 MG PO TABS: 50.0000 mg | ORAL_TABLET | Freq: Every day | ORAL | 1 refills | Status: DC

## 2023-09-20 MED ORDER — RALOXIFENE HCL 60 MG PO TABS: 60.0000 mg | ORAL_TABLET | Freq: Every day | ORAL | 1 refills | Status: DC

## 2023-09-20 MED ORDER — DULOXETINE HCL 60 MG PO CPEP: 60.0000 mg | ORAL_CAPSULE | Freq: Every day | ORAL | 1 refills | Status: DC

## 2023-09-20 MED ORDER — LEVOTHYROXINE SODIUM 150 MCG PO TABS: 150.0000 ug | ORAL_TABLET | Freq: Every day | ORAL | 3 refills | Status: DC

## 2023-09-20 MED ORDER — VALSARTAN 160 MG PO TABS: 160.0000 mg | ORAL_TABLET | Freq: Every day | ORAL | 1 refills | Status: DC

## 2023-09-20 NOTE — Progress Notes (Signed)
Subjective:    Patient ID: Jennifer Pratt, female    DOB: 1961/10/15, 62 y.o.   MRN: [id number]  Patient here for  Chief Complaint  Patient presents with   Annual Exam    HPI Here for a physical exam.  Had f/u with neurology 01/18/23 - f/u seizures - remains on carbatrol. Stable.  No seizures.  On zoloft and cymbalta. Doing well on these medications. Using cpap.  Stays active. No chest pain or sob reported.  Some occasional allergy symptoms.  Controls with nasal sprays.  No chest pain or sob reported.  No bowel change reported.  Keeps bowels regular with eating apples, etc.  Discussed calcium score.  Discussed bone density.    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 Lemar Livings   BREAST LUMPECTOMY Right 07/17/2017   DCIS mammosite   CATARACT EXTRACTION     CESAREAN SECTION     COLONOSCOPY N/A 10/05/2016   Procedure: COLONOSCOPY;  Surgeon: Scot Jun, MD;  Location: [facility] ENDOSCOPY;  Service: Endoscopy;  Laterality: N/A;  Zosyn IVPB   COLONOSCOPY WITH PROPOFOL N/A 11/10/2021   Procedure: COLONOSCOPY WITH PROPOFOL;  Surgeon: Earline Mayotte, MD;  Location: [facility] ENDOSCOPY;  Service: Endoscopy;  Laterality: N/A;   JOINT REPLACEMENT     MASTECTOMY, PARTIAL Right 07/17/2017   Procedure: MASTECTOMY PARTIAL;  Surgeon: Earline Mayotte, MD;  Location: [facility] ORS;  Service: General;  Laterality: Right;   thumb surgery Right 2005   AND CTR    THYROIDECTOMY  1988   TOTAL KNEE ARTHROPLASTY Left 2012   Family History  Problem Relation Age of Onset   Diabetes Father    Dementia Mother    Breast cancer Neg Hx    Colon cancer Neg Hx    Social History   Socioeconomic History   Marital status: Married    Spouse name: Not on file   Number of children: 1   Years of education: Not on file   Highest education level: Not on file  Occupational History    Employer: [employer]  Tobacco Use   Smoking status: Some Days    Current packs/day: 0.00    Average packs/day: 0.3 packs/day for 2.0 years (0.5 ttl pk-yrs)    Types: Cigarettes    Start date: 08/02/2015    Last attempt to quit: 08/01/2017    Years since quitting: 6.1   Smokeless tobacco: Never   Tobacco comments:    PT ONLY SMOKES SOCIALLY-SHE MAY GO A FEW MONTHS WITHOUT EVER SMOKING   Vaping Use   Vaping status: Never Used  Substance and Sexual Activity   Alcohol use: Not Currently   Drug use: No   Sexual activity: Yes  Other Topics Concern   Not on file  Social History Narrative   Not on file   Social Determinants of Health  Financial Resource Strain: Not on file  Food Insecurity: Not on file  Transportation Needs: Not on file  Physical Activity: Not on file  Stress: Not on file  Social Connections: Not on file     Review of Systems  Constitutional:  Negative for appetite change and unexpected weight change.  HENT:  Negative for congestion, sinus pressure and sore throat.   Eyes:  Negative for pain and visual disturbance.  Respiratory:  Negative for cough, chest tightness and shortness of breath.   Cardiovascular:  Negative for chest pain, palpitations and leg swelling.  Gastrointestinal:  Negative for abdominal pain, diarrhea, nausea and vomiting.  Genitourinary:  Negative for difficulty urinating and dysuria.  Musculoskeletal:  Negative for joint swelling and myalgias.  Skin:  Negative for color change and rash.  Neurological:  Negative for  dizziness and headaches.  Hematological:  Negative for adenopathy. Does not bruise/bleed easily.  Psychiatric/Behavioral:  Negative for agitation and dysphoric mood.        Objective:     BP 128/70   Pulse 79   Temp 98 F (36.7 C)   Resp 16   Ht 5\' 9"  (1.753 m)   Wt 242 lb (109.8 kg)   LMP 10/09/2012   SpO2 98%   BMI 35.74 kg/m  Wt Readings from Last 3 Encounters:  09/20/23 242 lb (109.8 kg)  09/15/22 236 lb 9.6 oz (107.3 kg)  01/17/22 241 lb (109.3 kg)    Physical Exam Vitals reviewed.  Constitutional:      General: She is not in acute distress.    Appearance: Normal appearance. She is well-developed.  HENT:     Head: Normocephalic and atraumatic.     Right Ear: External ear normal.     Left Ear: External ear normal.  Eyes:     General: No scleral icterus.       Right eye: No discharge.        Left eye: No discharge.     Conjunctiva/sclera: Conjunctivae normal.  Neck:     Thyroid: No thyromegaly.  Cardiovascular:     Rate and Rhythm: Normal rate and regular rhythm.  Pulmonary:     Effort: No tachypnea, accessory muscle usage or respiratory distress.     Breath sounds: Normal breath sounds. No decreased breath sounds or wheezing.  Chest:  Breasts:    Right: No inverted nipple, mass, nipple discharge or tenderness (no axillary adenopathy).     Left: No inverted nipple, mass, nipple discharge or tenderness (no axilarry adenopathy).  Abdominal:     General: Bowel sounds are normal.     Palpations: Abdomen is soft.     Tenderness: There is no abdominal tenderness.  Musculoskeletal:        General: No swelling or tenderness.     Cervical back: Neck supple.  Lymphadenopathy:     Cervical: No cervical adenopathy.  Skin:    Findings: No erythema or rash.  Neurological:     Mental Status: She is alert and oriented to person, place, and time.  Psychiatric:        Mood and Affect: Mood normal.        Behavior: Behavior normal.      Outpatient Encounter  Medications as of 09/20/2023  Medication Sig   carbamazepine (CARBATROL) 200 MG 12 hr capsule TAKE 1 CAPSULE BY MOUTH TWICE A DAY   diclofenac (VOLTAREN) 75 MG EC tablet Take 1 tablet (75 mg total) by mouth 2 (two) times daily.   DULoxetine (CYMBALTA)  60 MG capsule Take 1 capsule (60 mg total) by mouth daily.   fluticasone (FLONASE) 50 MCG/ACT nasal spray Place 1 spray into both nostrils daily as needed for allergies or rhinitis.   levothyroxine (SYNTHROID) 150 MCG tablet Take 1 tablet (150 mcg total) by mouth daily.   sertraline (ZOLOFT) 50 MG tablet Take 1 tablet (50 mg total) by mouth daily.   valsartan (DIOVAN) 160 MG tablet Take 1 tablet (160 mg total) by mouth daily.   [DISCONTINUED] amLODipine (NORVASC) 5 MG tablet TAKE 1 TABLET BY MOUTH EVERY DAY (Patient not taking: Reported on 09/15/2022)   [DISCONTINUED] DULoxetine (CYMBALTA) 60 MG capsule TAKE 1 CAPSULE BY MOUTH EVERY DAY   [DISCONTINUED] levothyroxine (SYNTHROID) 150 MCG tablet Take 1 tablet (150 mcg total) by mouth daily.   [DISCONTINUED] pravastatin (PRAVACHOL) 20 MG tablet TAKE 1 TABLET BY MOUTH EVERY DAY   [DISCONTINUED] raloxifene (EVISTA) 60 MG tablet Take 1 tablet (60 mg total) by mouth daily.   [DISCONTINUED] raloxifene (EVISTA) 60 MG tablet Take 1 tablet (60 mg total) by mouth daily.   [DISCONTINUED] sertraline (ZOLOFT) 50 MG tablet TAKE 1 TABLET BY MOUTH EVERY DAY   [DISCONTINUED] valsartan (DIOVAN) 160 MG tablet TAKE 1 TABLET BY MOUTH EVERY DAY   No facility-administered encounter medications on file as of 09/20/2023.     Lab Results  Component Value Date   WBC 5.1 09/20/2023   HGB 13.1 09/20/2023   HCT 40.1 09/20/2023   PLT 248.0 09/20/2023   GLUCOSE 101 (H) 09/20/2023   CHOL 261 (H) 09/20/2023   TRIG 259.0 (H) 09/20/2023   HDL 51.70 09/20/2023   LDLDIRECT 160.0 07/23/2020   LDLCALC 157 (H) 09/20/2023   ALT 19 09/20/2023   AST 14 09/20/2023   NA 141 09/20/2023   K 3.9 09/20/2023   CL 104 09/20/2023    CREATININE 0.88 09/20/2023   BUN 20 09/20/2023   CO2 29 09/20/2023   TSH 1.45 09/20/2023   INR 1.0 04/03/2020   HGBA1C 5.4 12/30/2020    MM 3D SCREENING MAMMOGRAM BILATERAL BREAST  Result Date: 09/04/2023 CLINICAL DATA:  Screening. EXAM: DIGITAL SCREENING BILATERAL MAMMOGRAM WITH TOMOSYNTHESIS AND CAD TECHNIQUE: Bilateral screening digital craniocaudal and mediolateral oblique mammograms were obtained. Bilateral screening digital breast tomosynthesis was performed. The images were evaluated with computer-aided detection. COMPARISON:  Previous exam(s). ACR Breast Density Category b: There are scattered areas of fibroglandular density. FINDINGS: There are no findings suspicious for malignancy. RIGHT breast surgical changes again noted. IMPRESSION: No mammographic evidence of malignancy. A result letter of this screening mammogram will be mailed directly to the patient. RECOMMENDATION: Screening mammogram in one year. (Code:SM-B-01Y) BI-RADS CATEGORY  2: Benign. Electronically Signed   By: Harmon Pier M.D.   On: 09/04/2023 18:17       Assessment & Plan:  Routine general medical examination at a health care facility  Health care maintenance Assessment & Plan: Physical today 09/20/23.  Mammogram 09/01/23 - Birads II.  Colonoscopy 11/10/21 - diverticulosis.  Recommended f/u in 8 years (Dr Lemar Livings).  PAP 05/2019 - negative with negative HPV - benign repair changes.  Repeat pap 07/15/20 - negative (atrophy) with negative HPV.  Repeat pap today.    Hypercholesterolemia Assessment & Plan: On pravastatin.  Low-cholesterol diet and exercise.  Follow lipid panel liver function test. Continue diet and exercise.    Orders: -     CBC with Differential/Platelet -     Hepatic function panel -     Lipid panel  Hypothyroidism, unspecified  type Assessment & Plan: On thyroid replacement.  Follow TSH.  Orders: -     TSH  Seizure disorder [facility]) Assessment & Plan: Remains on Carbatrol.  Follow levels.   Followed by neurology.  Follow CBC and liver function.  No recent seizures.  Orders: -     Carbamazepine level, total  Essential hypertension, benign Assessment & Plan: Continue diovan. Blood pressure as outlined.  Elevated above goal.  Have her spot check her pressures.  Send in readings.  Follow pressures.  Follow metabolic panel.   Orders: -     Basic metabolic panel  Screening for cervical cancer -     Cytology - PAP  Estrogen deficiency Assessment & Plan: Schedule bone density.   Orders: -     DG Bone Density; Future  Encounter for screening for coronary artery disease -     CT CARDIAC SCORING (SELF PAY ONLY); Future  Stress Assessment & Plan: On Zoloft and Cymbalta.  Hold on making changes.  Follow.    Obstructive sleep apnea Assessment & Plan: CPAP.    Ductal carcinoma in situ (DCIS) of right breast Assessment & Plan: Mammogram 09/04/23 - Birads II   Anxiety Assessment & Plan: Was seeing Dr Maryruth Bun.  Stable.  Continues on zoloft and cymbalta.    Other orders -     DULoxetine HCl; Take 1 capsule (60 mg total) by mouth daily.  Dispense: 90 capsule; Refill: 1 -     Levothyroxine Sodium; Take 1 tablet (150 mcg total) by mouth daily.  Dispense: 90 tablet; Refill: 3 -     Sertraline HCl; Take 1 tablet (50 mg total) by mouth daily.  Dispense: 90 tablet; Refill: 1 -     Valsartan; Take 1 tablet (160 mg total) by mouth daily.  Dispense: 90 tablet; Refill: 1     Dale Granite, MD

## 2023-09-20 NOTE — Assessment & Plan Note (Signed)
Physical today 09/20/23.  Mammogram 09/01/23 - Birads II.  Colonoscopy 11/10/21 - diverticulosis.  Recommended f/u in 8 years (Dr Lemar Livings).  PAP 05/2019 - negative with negative HPV - benign repair changes.  Repeat pap 07/15/20 - negative (atrophy) with negative HPV.  Repeat pap today.

## 2023-09-21 LAB — CARBAMAZEPINE LEVEL, TOTAL: Carbamazepine Lvl: 6.3 mg/L (ref 4.0–12.0)

## 2023-09-22 ENCOUNTER — Other Ambulatory Visit: Payer: Self-pay | Admitting: Internal Medicine

## 2023-09-22 MED ORDER — PRAVASTATIN SODIUM 20 MG PO TABS: 20.0000 mg | ORAL_TABLET | Freq: Every day | ORAL | 1 refills | Status: DC

## 2023-09-22 NOTE — Progress Notes (Signed)
Rx sent in for pravastatin 20mg  #90 with one refill.

## 2023-09-24 ENCOUNTER — Encounter: Payer: Self-pay | Admitting: Internal Medicine

## 2023-09-24 NOTE — Assessment & Plan Note (Signed)
Was seeing Dr Nicolasa Ducking.  Stable.  Continues on zoloft and cymbalta.

## 2023-09-24 NOTE — Assessment & Plan Note (Signed)
On Zoloft and Cymbalta.  Hold on making changes.  Follow.

## 2023-09-24 NOTE — Assessment & Plan Note (Signed)
On thyroid replacement.  Follow TSH 

## 2023-09-24 NOTE — Assessment & Plan Note (Signed)
CPAP.  

## 2023-09-24 NOTE — Assessment & Plan Note (Signed)
Mammogram 09/04/23 - Birads II

## 2023-09-24 NOTE — Assessment & Plan Note (Signed)
Schedule bone density.    

## 2023-09-24 NOTE — Assessment & Plan Note (Signed)
On pravastatin.  Low-cholesterol diet and exercise.  Follow lipid panel liver function test. Continue diet and exercise.

## 2023-09-24 NOTE — Assessment & Plan Note (Signed)
Remains on Carbatrol.  Follow levels.  Followed by neurology.  Follow CBC and liver function.  No recent seizures.

## 2023-09-24 NOTE — Assessment & Plan Note (Signed)
Continue diovan. Blood pressure as outlined.  Elevated above goal.  Have her spot check her pressures.  Send in readings.  Follow pressures.  Follow metabolic panel.

## 2023-09-25 LAB — CYTOLOGY - PAP
Comment: NEGATIVE
Diagnosis: NEGATIVE
High risk HPV: NEGATIVE

## 2023-09-27 ENCOUNTER — Ambulatory Visit
Admission: RE | Admit: 2023-09-27 | Discharge: 2023-09-27 | Disposition: A | Discharge status: Home / Self Care | Payer: 59 | Source: Ambulatory Visit | Attending: Internal Medicine | Admitting: Internal Medicine

## 2023-09-27 DIAGNOSIS — Z136 Encounter for screening for cardiovascular disorders: Secondary | ICD-10-CM | POA: Insufficient documentation

## 2023-10-02 ENCOUNTER — Other Ambulatory Visit: Payer: Self-pay | Admitting: Internal Medicine

## 2023-10-02 ENCOUNTER — Other Ambulatory Visit: Payer: Self-pay

## 2023-10-02 DIAGNOSIS — R931 Abnormal findings on diagnostic imaging of heart and coronary circulation: Secondary | ICD-10-CM

## 2023-10-02 MED ORDER — PRAVASTATIN SODIUM 40 MG PO TABS: 40.0000 mg | ORAL_TABLET | Freq: Every day | ORAL | 0 refills | Status: DC

## 2023-10-02 NOTE — Progress Notes (Signed)
Order placed for cardiology referral.   

## 2023-12-26 ENCOUNTER — Other Ambulatory Visit: Payer: Self-pay | Admitting: Internal Medicine

## 2023-12-26 NOTE — Progress Notes (Signed)
Cardiology Office Note  Date:  12/29/2023   ID:  Jennifer Pratt, DOB Jan 30, 1961, MRN [id number]  PCP:  Dale [place], MD   Chief Complaint  Patient presents with   New Patient (Initial Visit)    Ref by Dale Millen, MD to discuss elevated cardiac CT score. Patient c/o shortness of breath with over exertion.     HPI:  Jennifer Pratt is a 63 year old woman with past medical history of Coronary calcification, three-vessel coronary calcification Hyperlipidemia Seizures Smoker, quit  OSA on CPAP arthritis Who presents by referral from Dr. Lorin Picket for consultation of her coronary calcification  CT calcium scoring September 27, 2023 Calcium score 366, 97th percentile Images pulled up and reviewed with her in the office, predominantly in the LAD and left circumflex, mild in the RCA  Active at baseline Does bike/ellipital 15 min at home, has a bike in her dining room Does treadmill, ok on flat, can go " forever" or until her hip starts hurting No chest pain or shortness of breath with her home exercise program Some SOB at work, when walking uphill, some SOB when she gets back to her desk  Changed diet, eats healthy at home  Lab work reviewed Total cholesterol 261, at that point was started on pravastatin 40 since oct 2024 LDL 157 No recheck on pravastatin  Family hx: Mother: High cholesterol, dementia, age 68  EKG personally reviewed by myself on todays visit EKG Interpretation Date/Time:  Friday December 29 2023 08:53:43 EST Ventricular Rate:  91 PR Interval:  150 QRS Duration:  104 QT Interval:  376 QTC Calculation: 462 R Axis:   27  Text Interpretation: Normal sinus rhythm Nonspecific ST and T wave abnormality When compared with ECG of 13-Jul-2017 08:05, No significant change was found Confirmed by Julien Nordmann [phone]) on 12/29/2023 9:01:19 AM    PMH:   has a past medical history of Arthritis, Breast cancer (HCC) (06/28/2017), Hypercholesterolemia, Hypertension,  Hypothyroidism, Osteoarthritis, Seizures (HCC), Sleep apnea, and Ulcer.  PSH:    Past Surgical History:  Procedure Laterality Date   BREAST BIOPSY Right 06/28/2017   Stereo affirm- UOQ/DUCTAL CARCINOMA IN SITU, HIGH NUCLEAR GRADE WITH COMEDONECROSIS    BREAST EXCISIONAL BIOPSY Right 07/17/2017   WIde excision upper outer quadrant DCIS  Dr. Lemar Livings   BREAST LUMPECTOMY Right 07/17/2017   DCIS mammosite   CATARACT EXTRACTION     CESAREAN SECTION     COLONOSCOPY N/A 10/05/2016   Procedure: COLONOSCOPY;  Surgeon: Scot Jun, MD;  Location: [facility] ENDOSCOPY;  Service: Endoscopy;  Laterality: N/A;  Zosyn IVPB   COLONOSCOPY WITH PROPOFOL N/A 11/10/2021   Procedure: COLONOSCOPY WITH PROPOFOL;  Surgeon: Earline Mayotte, MD;  Location: [facility] ENDOSCOPY;  Service: Endoscopy;  Laterality: N/A;   JOINT REPLACEMENT     MASTECTOMY, PARTIAL Right 07/17/2017   Procedure: MASTECTOMY PARTIAL;  Surgeon: Earline Mayotte, MD;  Location: [facility] ORS;  Service: General;  Laterality: Right;   thumb surgery Right 2005   AND CTR   THYROIDECTOMY  1988   TOTAL KNEE ARTHROPLASTY Left 2012    Current Outpatient Medications  Medication Sig Dispense Refill   carbamazepine (CARBATROL) 200 MG 12 hr capsule TAKE 1 CAPSULE BY MOUTH TWICE A DAY 180 capsule 4   diclofenac (VOLTAREN) 75 MG EC tablet Take 1 tablet (75 mg total) by mouth 2 (two) times daily. 60 tablet 0   DULoxetine (CYMBALTA) 60 MG capsule Take 1 capsule (60 mg total) by mouth daily. 90 capsule 1  levothyroxine (SYNTHROID) 150 MCG tablet Take 1 tablet (150 mcg total) by mouth daily. 90 tablet 3   pravastatin (PRAVACHOL) 40 MG tablet TAKE 1 TABLET BY MOUTH EVERY DAY 90 tablet 1   sertraline (ZOLOFT) 50 MG tablet Take 1 tablet (50 mg total) by mouth daily. 90 tablet 1   valsartan (DIOVAN) 160 MG tablet Take 1 tablet (160 mg total) by mouth daily. 90 tablet 1   No current facility-administered medications for this visit.    Allergies:   Lisinopril    Social History:  The patient  reports that she has been smoking cigarettes. She started smoking about 8 years ago. She has a 0.5 pack-year smoking history. She has never used smokeless tobacco. She reports that she does not currently use alcohol. She reports that she does not use drugs.   Family History:   family history includes Dementia in her mother; Diabetes in her father.    Review of Systems: Review of Systems  Constitutional: Negative.   HENT: Negative.    Respiratory:  Positive for shortness of breath.   Cardiovascular: Negative.   Gastrointestinal: Negative.   Musculoskeletal: Negative.   Neurological: Negative.   Psychiatric/Behavioral: Negative.    All other systems reviewed and are negative.   PHYSICAL EXAM: VS:  BP (!) 132/90 (BP Location: Right Arm, Patient Position: Sitting, Cuff Size: Large)   Pulse 91   Ht 5\' 9"  (1.753 m)   Wt 245 lb 2 oz (111.2 kg)   LMP 10/09/2012   SpO2 98%   BMI 36.20 kg/m  , BMI Body mass index is 36.2 kg/m. GEN: Well nourished, well developed, in no acute distress HEENT: normal Neck: no JVD, carotid bruits, or masses Cardiac: RRR; no murmurs, rubs, or gallops,no edema  Respiratory:  clear to auscultation bilaterally, normal work of breathing GI: soft, nontender, nondistended, + BS MS: no deformity or atrophy Skin: warm and dry, no rash Neuro:  Strength and sensation are intact Psych: euthymic mood, full affect  Recent Labs: 09/20/2023: ALT 19; BUN 20; Creatinine, Ser 0.88; Hemoglobin 13.1; Platelets 248.0; Potassium 3.9; Sodium 141; TSH 1.45    Lipid Panel Lab Results  Component Value Date   CHOL 261 (H) 09/20/2023   HDL 51.70 09/20/2023   LDLCALC 157 (H) 09/20/2023   TRIG 259.0 (H) 09/20/2023      Wt Readings from Last 3 Encounters:  12/29/23 245 lb 2 oz (111.2 kg)  09/20/23 242 lb (109.8 kg)  09/15/22 236 lb 9.6 oz (107.3 kg)     ASSESSMENT AND PLAN:  Problem List Items Addressed This Visit       Cardiology  Problems   Essential hypertension, benign - Primary   Relevant Orders   EKG 12-Lead (Completed)   Other Visit Diagnoses       Morbid obesity (HCC)         High coronary artery calcium score         Mixed hyperlipidemia          Coronary calcification Images pulled up and reviewed, stressed the importance of aggressive lipid management Previously was inconsistent with her cholesterol medication though reports she has been consistent with pravastatin 40 mg daily for the past several months.  We have ordered lipid panel Goal LDL less than 70, for high numbers could add Zetia 10 mg daily In addition could change to more potent statin such as Crestor 40 -Denies anginal symptoms  Shortness of breath -Suspect shortness of breath secondary to deconditioning and need for  further exercise, lifestyle modification, weight loss -For worsening symptoms with her home exercise regiment could consider ischemic workup  Essential hypertension Blood pressure is well controlled on today's visit. No changes made to the medications.  Continue valsartan 160 daily  Elevated BMI BMI 36 Lifestyle modification recommended, walking program, low carbohydrates  Borderline elevated glucose Management as above, walking program, low carbohydrates  Signed, Dossie Arbour, M.D., Ph.D. [facility] [place], [place] [phone]

## 2023-12-29 ENCOUNTER — Ambulatory Visit: Payer: 59 | Attending: Cardiovascular Disease | Admitting: Cardiovascular Disease

## 2023-12-29 ENCOUNTER — Encounter: Payer: Self-pay | Admitting: Cardiovascular Disease

## 2023-12-29 VITALS — BP 132/90 | HR 91 | Ht 69.0 in | Wt 245.1 lb

## 2023-12-29 DIAGNOSIS — R931 Abnormal findings on diagnostic imaging of heart and coronary circulation: Secondary | ICD-10-CM | POA: Diagnosis not present

## 2023-12-29 DIAGNOSIS — I10 Essential (primary) hypertension: Secondary | ICD-10-CM | POA: Diagnosis not present

## 2023-12-29 DIAGNOSIS — E782 Mixed hyperlipidemia: Secondary | ICD-10-CM | POA: Diagnosis not present

## 2023-12-29 DIAGNOSIS — Z79899 Other long term (current) drug therapy: Secondary | ICD-10-CM

## 2023-12-29 NOTE — Patient Instructions (Addendum)
Medication Instructions:  No changes  If you need a refill on your cardiac medications before your next appointment, please call your pharmacy.   Lab work: Lipid panel  Testing/Procedures: No new testing needed  Follow-Up: At [facility], you and your health needs are our priority.  As part of our continuing mission to provide you with exceptional heart care, we have created designated Provider Care Teams.  These Care Teams include your primary Cardiologist (physician) and Advanced Practice Providers (APPs -  Physician Assistants and Nurse Practitioners) who all work together to provide you with the care you need, when you need it.  You will need a follow up appointment in 12 months  Providers on your designated Care Team:   Nicolasa Ducking, NP Eula Listen, PA-C Cadence Fransico [place], [place]  COVID-19 Vaccine Information can be found at: [email or web address] For questions related to vaccine distribution or appointments, please email vaccine@[email or web address] .[email or web address] or call [phone].

## 2023-12-30 LAB — LIPID PANEL
Chol/HDL Ratio: 3.6 {ratio} (ref 0.0–4.4)
Cholesterol, Total: 217 mg/dL — ABNORMAL HIGH (ref 100–199)
HDL: 60 mg/dL (ref >39)
LDL Chol Calc (NIH): 118 mg/dL — ABNORMAL HIGH (ref 0–99)
Triglycerides: 224 mg/dL — ABNORMAL HIGH (ref 0–149)
VLDL Cholesterol Cal: 39 mg/dL (ref 5–40)

## 2024-01-05 ENCOUNTER — Encounter: Payer: Self-pay | Admitting: Cardiovascular Disease

## 2024-01-24 ENCOUNTER — Telehealth (INDEPENDENT_AMBULATORY_CARE_PROVIDER_SITE_OTHER): Payer: 59 | Admitting: Neurology

## 2024-01-24 DIAGNOSIS — G40909 Epilepsy, unspecified, not intractable, without status epilepticus: Secondary | ICD-10-CM | POA: Diagnosis not present

## 2024-01-24 MED ORDER — CARBAMAZEPINE ER 200 MG PO CP12: ORAL_CAPSULE | ORAL | 4 refills | Status: AC

## 2024-01-24 NOTE — Patient Instructions (Signed)
 Great to see you today! Happy early birthday! We will continue current dose of Carbatrol for seizure prevention Suggest checking a vitamin D level at next blood draw Call for seizures, I will see in 1 year.  Thanks!!

## 2024-01-24 NOTE — Progress Notes (Signed)
   Virtual Visit via Video Note  I connected with Jennifer Pratt on 01/24/24 at  2:15 PM EST by a video enabled telemedicine application and verified that I am speaking with the correct person using two identifiers.  Location: Patient: at her work  Provider: in the office    I discussed the limitations of evaluation and management by telemedicine and the availability of in person appointments. The patient expressed understanding and agreed to proceed.  History of Present Illness: 01/24/24 SS: Here today via VV. No seizures. Continues to work full time. Remains on generic carbatrol 200 mg 12 hr tablet twice daily.  Labs with PCP October 2024 carbamazepine 6.3, normal CBC, BMP, LFTs.   01/18/23 SS: She is doing well today. No seizures. Remains on Carbatrol.  No new issues or concerns.  PCP checked labs in October 2023 carbamazepine level 7.1.  Her Synthroid has been adjusted, otherwise no new health issues.  01/13/22 SS: [name] is here today for follow-up for seizures. Well controlled on Carbatrol. No seizures since starting the medication around 2000. Has had 1 grand mal seizures, the rest were focal aware seizures, hear music playing for 1 minute always around her menstrual cycle. Carbamazepine level was 5.5 in Jan 2023, CBC, BMP, LFT unremarkable. Still commuting for work 80 miles.    Observations/Objective: Via virtual visit, is alert and oriented, speech is clear and concise, moves about freely  Assessment and Plan: 1.  Seizures -Remains under excellent control, last seizure was in 2000 -Continue Carbatrol 200 mg twice a day -I reviewed labs from PCP, suggest adding Vitamin D at next lab draw -Call for seizures, continues to commute long distance   Meds ordered this encounter  Medications   carbamazepine (CARBATROL) 200 MG 12 hr capsule    Sig: TAKE 1 CAPSULE BY MOUTH TWICE A DAY    Dispense:  180 capsule    Refill:  4   Follow Up Instructions: 1 year video visit   I discussed  the assessment and treatment plan with the patient. The patient was provided an opportunity to ask questions and all were answered. The patient agreed with the plan and demonstrated an understanding of the instructions.   The patient was advised to call back or seek an in-person evaluation if the symptoms worsen or if the condition fails to improve as anticipated.  Otila Kluver, DNP  [facility] [address], [address] [place], [place] [address] [phone]

## 2024-03-20 ENCOUNTER — Ambulatory Visit: Payer: 59 | Admitting: Internal Medicine

## 2024-03-26 ENCOUNTER — Telehealth: Payer: Self-pay

## 2024-03-26 NOTE — Telephone Encounter (Signed)
 Copied from CRM [id number]. Topic: Clinical - Prescription Issue >> Mar 26, 2024  5:16 PM DeAngela L wrote: Reason for CRM: Tyra Galley calling from [facility] to ask if she could get a quick verbal OK to change the manufacturer of levothyroxine  (SYNTHROID ) 150 MCG tablet  Medication  [id number] [place], [place] - [address] [place] [address] Phone: [phone] Fax: [phone]

## 2024-03-27 NOTE — Telephone Encounter (Signed)
 Ok. Make note on schedule or with next labs to check tsh.

## 2024-03-27 NOTE — Telephone Encounter (Signed)
 Talked with patient. She is ok to switch. She has appt to see you in June. Will check tsh at her appt. Ok to change manufacturer ?

## 2024-03-28 NOTE — Telephone Encounter (Signed)
Noted on schedule °

## 2024-04-03 ENCOUNTER — Other Ambulatory Visit: Payer: Self-pay | Admitting: Internal Medicine

## 2024-04-05 ENCOUNTER — Other Ambulatory Visit: Payer: Self-pay | Admitting: Internal Medicine

## 2024-04-17 ENCOUNTER — Other Ambulatory Visit: Payer: Self-pay | Admitting: Internal Medicine

## 2024-04-17 DIAGNOSIS — Z1231 Encounter for screening mammogram for malignant neoplasm of breast: Secondary | ICD-10-CM

## 2024-05-15 ENCOUNTER — Ambulatory Visit: Admitting: Internal Medicine

## 2024-06-07 ENCOUNTER — Encounter: Payer: Self-pay | Admitting: Internal Medicine

## 2024-06-07 NOTE — Telephone Encounter (Signed)
 Pt requesting refill. Med pended for approval

## 2024-06-08 NOTE — Telephone Encounter (Signed)
 I need to clarify how often she is taking the medication. Also, we will need to make sure has f/u labs - will need to follow kidney function.  Just need to clarify few things before refill.

## 2024-06-10 MED ORDER — DICLOFENAC SODIUM 75 MG PO TBEC: 75.0000 mg | DELAYED_RELEASE_TABLET | Freq: Two times a day (BID) | ORAL | 0 refills | Status: DC

## 2024-06-10 NOTE — Telephone Encounter (Signed)
 I have refilled the diclofenac  x 1.  Needs a f/u appt with me before next refill and will need f/u labs

## 2024-06-20 ENCOUNTER — Other Ambulatory Visit: Payer: Self-pay | Admitting: Internal Medicine

## 2024-06-20 NOTE — Telephone Encounter (Signed)
 Rx ok'd for pravastatin 

## 2024-07-07 ENCOUNTER — Other Ambulatory Visit: Payer: Self-pay | Admitting: Internal Medicine

## 2024-07-07 DIAGNOSIS — E039 Hypothyroidism, unspecified: Secondary | ICD-10-CM

## 2024-07-07 DIAGNOSIS — E7800 Pure hypercholesterolemia, unspecified: Secondary | ICD-10-CM

## 2024-07-07 DIAGNOSIS — G40909 Epilepsy, unspecified, not intractable, without status epilepticus: Secondary | ICD-10-CM

## 2024-07-09 NOTE — Telephone Encounter (Signed)
 I have refilled the voltaren . I have also ordered labs.  Needs fasting lab appt before next refill.

## 2024-08-04 ENCOUNTER — Other Ambulatory Visit: Payer: Self-pay | Admitting: Internal Medicine

## 2024-08-13 ENCOUNTER — Ambulatory Visit: Admitting: Internal Medicine

## 2024-08-20 ENCOUNTER — Ambulatory Visit (INDEPENDENT_AMBULATORY_CARE_PROVIDER_SITE_OTHER): Admitting: Internal Medicine

## 2024-08-20 ENCOUNTER — Encounter: Payer: Self-pay | Admitting: Internal Medicine

## 2024-08-20 VITALS — BP 128/72 | HR 86 | Resp 16 | Ht 69.0 in | Wt 240.8 lb

## 2024-08-20 DIAGNOSIS — G40909 Epilepsy, unspecified, not intractable, without status epilepticus: Secondary | ICD-10-CM

## 2024-08-20 DIAGNOSIS — I10 Essential (primary) hypertension: Secondary | ICD-10-CM | POA: Diagnosis not present

## 2024-08-20 DIAGNOSIS — H6120 Impacted cerumen, unspecified ear: Secondary | ICD-10-CM

## 2024-08-20 DIAGNOSIS — E7800 Pure hypercholesterolemia, unspecified: Secondary | ICD-10-CM

## 2024-08-20 DIAGNOSIS — Z1211 Encounter for screening for malignant neoplasm of colon: Secondary | ICD-10-CM

## 2024-08-20 DIAGNOSIS — E039 Hypothyroidism, unspecified: Secondary | ICD-10-CM

## 2024-08-20 DIAGNOSIS — G4733 Obstructive sleep apnea (adult) (pediatric): Secondary | ICD-10-CM

## 2024-08-20 DIAGNOSIS — D0511 Intraductal carcinoma in situ of right breast: Secondary | ICD-10-CM

## 2024-08-20 DIAGNOSIS — F419 Anxiety disorder, unspecified: Secondary | ICD-10-CM

## 2024-08-20 DIAGNOSIS — F439 Reaction to severe stress, unspecified: Secondary | ICD-10-CM

## 2024-08-20 LAB — TSH: TSH: 3.79 u[IU]/mL (ref 0.35–5.50)

## 2024-08-20 LAB — CBC WITH DIFFERENTIAL/PLATELET
Basophils Absolute: 0.1 K/uL (ref 0.0–0.1)
Basophils Relative: 1.7 % (ref 0.0–3.0)
Eosinophils Absolute: 0.1 K/uL (ref 0.0–0.7)
Eosinophils Relative: 2.6 % (ref 0.0–5.0)
HCT: 39.2 % (ref 36.0–46.0)
Hemoglobin: 13.2 g/dL (ref 12.0–15.0)
Lymphocytes Relative: 37.4 % (ref 12.0–46.0)
Lymphs Abs: 1.8 K/uL (ref 0.7–4.0)
MCHC: 33.7 g/dL (ref 30.0–36.0)
MCV: 90.8 fl (ref 78.0–100.0)
Monocytes Absolute: 0.3 K/uL (ref 0.1–1.0)
Monocytes Relative: 6.5 % (ref 3.0–12.0)
Neutro Abs: 2.5 K/uL (ref 1.4–7.7)
Neutrophils Relative %: 51.8 % (ref 43.0–77.0)
Platelets: 248 K/uL (ref 150.0–400.0)
RBC: 4.31 Mil/uL (ref 3.87–5.11)
RDW: 13.3 % (ref 11.5–15.5)
WBC: 4.8 K/uL (ref 4.0–10.5)

## 2024-08-20 LAB — HEPATIC FUNCTION PANEL
ALT: 22 U/L (ref 0–35)
AST: 16 U/L (ref 0–37)
Albumin: 4.6 g/dL (ref 3.5–5.2)
Alkaline Phosphatase: 59 U/L (ref 39–117)
Bilirubin, Direct: 0.1 mg/dL (ref 0.0–0.3)
Total Bilirubin: 0.4 mg/dL (ref 0.2–1.2)
Total Protein: 7.1 g/dL (ref 6.0–8.3)

## 2024-08-20 LAB — LIPID PANEL
Cholesterol: 217 mg/dL — ABNORMAL HIGH (ref 0–200)
HDL: 54.1 mg/dL (ref >39.00)
LDL Cholesterol: 129 mg/dL — ABNORMAL HIGH (ref 0–99)
NonHDL: 163.06
Total CHOL/HDL Ratio: 4
Triglycerides: 171 mg/dL — ABNORMAL HIGH (ref 0.0–149.0)
VLDL: 34.2 mg/dL (ref 0.0–40.0)

## 2024-08-20 LAB — BASIC METABOLIC PANEL WITH GFR
BUN: 15 mg/dL (ref 6–23)
CO2: 30 meq/L (ref 19–32)
Calcium: 9.5 mg/dL (ref 8.4–10.5)
Chloride: 103 meq/L (ref 96–112)
Creatinine, Ser: 0.87 mg/dL (ref 0.40–1.20)
GFR: 71.34 mL/min (ref >60.00)
Glucose, Bld: 100 mg/dL — ABNORMAL HIGH (ref 70–99)
Potassium: 4.1 meq/L (ref 3.5–5.1)
Sodium: 141 meq/L (ref 135–145)

## 2024-08-20 LAB — VITAMIN D 25 HYDROXY (VIT D DEFICIENCY, FRACTURES): VITD: 8.65 ng/mL — ABNORMAL LOW (ref 30.00–100.00)

## 2024-08-20 MED ORDER — VALSARTAN 160 MG PO TABS: 160.0000 mg | ORAL_TABLET | Freq: Every day | ORAL | 1 refills | Status: AC

## 2024-08-20 MED ORDER — SERTRALINE HCL 50 MG PO TABS: 50.0000 mg | ORAL_TABLET | Freq: Every day | ORAL | 1 refills | Status: AC

## 2024-08-20 MED ORDER — DULOXETINE HCL 60 MG PO CPEP: 60.0000 mg | ORAL_CAPSULE | Freq: Every day | ORAL | 1 refills | Status: AC

## 2024-08-20 MED ORDER — PRAVASTATIN SODIUM 40 MG PO TABS: 40.0000 mg | ORAL_TABLET | Freq: Every day | ORAL | 3 refills | Status: AC

## 2024-08-20 MED ORDER — LEVOTHYROXINE SODIUM 150 MCG PO TABS: 150.0000 ug | ORAL_TABLET | Freq: Every day | ORAL | 3 refills | Status: AC

## 2024-08-20 NOTE — Assessment & Plan Note (Signed)
 Remains on Carbatrol .  Follow levels.  Followed by neurology.  Follow CBC and liver function.  No recent seizures. Check level today.

## 2024-08-20 NOTE — Assessment & Plan Note (Signed)
 On Zoloft  and Cymbalta .  Appears to be handling things well. Follow. No changes today.

## 2024-08-20 NOTE — Assessment & Plan Note (Signed)
Colonoscopy 10/2021 - diverticulosis.  Recommended f/u colonoscopy in 8 years.

## 2024-08-20 NOTE — Progress Notes (Signed)
 Subjective:    Patient ID: Jennifer Pratt, female    DOB: [birth year], 63 y.o.   MRN: [id number]  Patient here for  Chief Complaint  Patient presents with   Medical Management of Chronic Issues    HPI Here for a scheduled follow up. Had f/u with neurology 01/24/24 - f/u seizures - remains on carbatrol . Stable.  No seizures.  On zoloft  and cymbalta . Doing well on these medications. Using cpap. Saw cardiology 12/29/23 - f/u coronary calcification. Recommended adding zetia  to pravastatin . Saw ortho 01/2024 - mucus cyst of the left middle finger - treated with bactrim s/p mucus excision. Increased stress recently. Stress with husband's health issues. She feels she is handling things relatively well. Tries to stay active. No chest pain or sob reported. No abdominal pain or bowel change. Elects to remain on voltaren  and cymbalta . Controls her pain well. Able to function and stay active on this regimen.    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 Dessa   BREAST LUMPECTOMY Right 07/17/2017   DCIS mammosite   CATARACT EXTRACTION     CESAREAN SECTION     COLONOSCOPY N/A 10/05/2016   Procedure: COLONOSCOPY;  Surgeon: Lamar ONEIDA Holmes, MD;  Location: [facility] ENDOSCOPY;  Service: Endoscopy;  Laterality: N/A;  Zosyn  IVPB   COLONOSCOPY WITH PROPOFOL  N/A 11/10/2021   Procedure: COLONOSCOPY WITH PROPOFOL ;  Surgeon: Dessa Reyes ORN, MD;  Location: [facility]  ENDOSCOPY;  Service: Endoscopy;  Laterality: N/A;   JOINT REPLACEMENT     MASTECTOMY, PARTIAL Right 07/17/2017   Procedure: MASTECTOMY PARTIAL;  Surgeon: Dessa Reyes ORN, MD;  Location: [facility] ORS;  Service: General;  Laterality: Right;   thumb surgery Right 2005   AND CTR   THYROIDECTOMY  1988   TOTAL KNEE ARTHROPLASTY Left 2012   Family History  Problem Relation Age of Onset   Diabetes Father    Dementia Mother    Breast cancer Neg Hx    Colon cancer Neg Hx    Social History   Socioeconomic History   Marital status: Married    Spouse name: Not on file   Number of children: 1   Years of education: Not on file   Highest education level: 12th grade  Occupational History    Employer: [employer]  Tobacco Use   Smoking status: Some Days    Current packs/day: 0.00    Average packs/day: 0.3 packs/day for 2.0 years (0.5 ttl pk-yrs)    Types: Cigarettes    Start date: 08/02/2015    Last attempt to quit: 08/01/2017    Years since quitting: 7.0   Smokeless tobacco: Never   Tobacco comments:    PT ONLY SMOKES SOCIALLY-SHE MAY GO A FEW MONTHS WITHOUT EVER SMOKING   Vaping Use   Vaping status: Never Used  Substance and Sexual Activity   Alcohol use: Not  Currently   Drug use: No   Sexual activity: Yes  Other Topics Concern   Not on file  Social History Narrative   Not on file   Social Drivers of Health   Financial Resource Strain: Low Risk  (08/16/2024)   Overall Financial Resource Strain (CARDIA)    Difficulty of Paying Living Expenses: Not hard at all  Food Insecurity: No Food Insecurity (08/16/2024)   Hunger Vital Sign    Worried About Running Out of Food in the Last Year: Never true    Ran Out of Food in the Last Year: Never true  Transportation Needs: No Transportation Needs (08/16/2024)   PRAPARE - Administrator, Civil Service (Medical): No    Lack of Transportation (Non-Medical): No  Physical Activity: Insufficiently Active (08/16/2024)   Exercise  Vital Sign    Days of Exercise per Week: 4 days    Minutes of Exercise per Session: 10 min  Stress: No Stress Concern Present (08/16/2024)   [employer] of Occupational Health - Occupational Stress Questionnaire    Feeling of Stress: Not at all  Social Connections: Socially Integrated (08/16/2024)   Social Connection and Isolation Panel    Frequency of Communication with Friends and Family: More than three times a week    Frequency of Social Gatherings with Friends and Family: More than three times a week    Attends Religious Services: More than 4 times per year    Active Member of [employer] or Organizations: Yes    Attends Engineer, structural: More than 4 times per year    Marital Status: Married     Review of Systems  Constitutional:  Negative for appetite change and unexpected weight change.  HENT:  Negative for congestion and sinus pressure.   Respiratory:  Negative for cough, chest tightness and shortness of breath.   Cardiovascular:  Negative for chest pain, palpitations and leg swelling.  Gastrointestinal:  Negative for abdominal pain, diarrhea, nausea and vomiting.  Genitourinary:  Negative for difficulty urinating and dysuria.  Musculoskeletal:  Negative for joint swelling and myalgias.  Skin:  Negative for color change and rash.  Neurological:  Negative for dizziness and headaches.  Psychiatric/Behavioral:  Negative for agitation and dysphoric mood.        Objective:     BP 128/72   Pulse 86   Resp 16   Ht 5' 9 (1.753 m)   Wt 240 lb 12.8 oz (109.2 kg)   LMP 10/09/2012   SpO2 98%   BMI 35.56 kg/m  Wt Readings from Last 3 Encounters:  08/20/24 240 lb 12.8 oz (109.2 kg)  12/29/23 245 lb 2 oz (111.2 kg)  09/20/23 242 lb (109.8 kg)    Physical Exam Vitals reviewed.  Constitutional:      General: She is not in acute distress.    Appearance: Normal appearance.  HENT:     Head: Normocephalic and atraumatic.     Right Ear: External ear normal.      Left Ear: External ear normal.     Mouth/Throat:     Pharynx: No oropharyngeal exudate or posterior oropharyngeal erythema.  Eyes:     General: No scleral icterus.       Right eye: No discharge.        Left eye: No discharge.     Conjunctiva/sclera: Conjunctivae normal.  Neck:     Thyroid : No thyromegaly.  Cardiovascular:     Rate and Rhythm: Normal rate and regular rhythm.  Pulmonary:     Effort: No respiratory distress.     Breath sounds: Normal breath sounds. No wheezing.  Abdominal:     General: Bowel sounds are normal.     Palpations: Abdomen is soft.     Tenderness: There is no abdominal tenderness.  Musculoskeletal:        General: No swelling or tenderness.     Cervical back: Neck supple. No tenderness.  Lymphadenopathy:     Cervical: No cervical adenopathy.  Skin:    Findings: No erythema or rash.  Neurological:     Mental Status: She is alert.  Psychiatric:        Mood and Affect: Mood normal.        Behavior: Behavior normal.         Outpatient Encounter Medications as of 08/20/2024  Medication Sig   carbamazepine  (CARBATROL ) 200 MG 12 hr capsule TAKE 1 CAPSULE BY MOUTH TWICE A DAY   diclofenac  (VOLTAREN ) 75 MG EC tablet TAKE 1 TABLET BY MOUTH TWICE A DAY   DULoxetine  (CYMBALTA ) 60 MG capsule Take 1 capsule (60 mg total) by mouth daily.   levothyroxine  (SYNTHROID ) 150 MCG tablet Take 1 tablet (150 mcg total) by mouth daily.   pravastatin  (PRAVACHOL ) 40 MG tablet Take 1 tablet (40 mg total) by mouth daily.   sertraline  (ZOLOFT ) 50 MG tablet Take 1 tablet (50 mg total) by mouth daily.   valsartan  (DIOVAN ) 160 MG tablet Take 1 tablet (160 mg total) by mouth daily.   [DISCONTINUED] DULoxetine  (CYMBALTA ) 60 MG capsule TAKE 1 CAPSULE BY MOUTH EVERY DAY   [DISCONTINUED] levothyroxine  (SYNTHROID ) 150 MCG tablet Take 1 tablet (150 mcg total) by mouth daily.   [DISCONTINUED] pravastatin  (PRAVACHOL ) 40 MG tablet TAKE 1 TABLET BY MOUTH EVERY DAY   [DISCONTINUED]  sertraline  (ZOLOFT ) 50 MG tablet TAKE 1 TABLET BY MOUTH EVERY DAY   [DISCONTINUED] valsartan  (DIOVAN ) 160 MG tablet TAKE 1 TABLET BY MOUTH EVERY DAY   No facility-administered encounter medications on file as of 08/20/2024.     Lab Results  Component Value Date   WBC 5.1 09/20/2023   HGB 13.1 09/20/2023   HCT 40.1 09/20/2023   PLT 248.0 09/20/2023   GLUCOSE 101 (H) 09/20/2023   CHOL 217 (H) 12/29/2023   TRIG 224 (H) 12/29/2023   HDL 60 12/29/2023   LDLDIRECT 160.0 07/23/2020   LDLCALC 118 (H) 12/29/2023   ALT 19 09/20/2023   AST 14 09/20/2023   NA 141 09/20/2023   K 3.9 09/20/2023   CL 104 09/20/2023   CREATININE 0.88 09/20/2023   BUN 20 09/20/2023   CO2 29 09/20/2023   TSH 1.45 09/20/2023   INR 1.0 04/03/2020   HGBA1C 5.4 12/30/2020    CT CARDIAC SCORING (SELF PAY ONLY) Addendum Date: 10/20/2023 ADDENDUM REPORT: 10/20/2023 02:42 EXAM: OVER-READ INTERPRETATION  CT CHEST The following report is an over-read performed by radiologist Dr. Oneil Devonshire of [facility] on 10/20/2023. This over-read does not include interpretation of cardiac or coronary anatomy or pathology. The coronary calcium  score interpretation by the cardiologist is attached. COMPARISON:  None. FINDINGS: Cardiovascular: There are no significant extracardiac vascular findings. Mediastinum/Nodes: There are no enlarged lymph nodes within the visualized mediastinum. Lungs/Pleura: There is no pleural effusion. The visualized lungs appear clear. Upper abdomen: No significant findings in the visualized upper abdomen. Musculoskeletal/Chest wall: No chest wall mass or suspicious osseous findings within the visualized chest. IMPRESSION: No significant extracardiac findings within the visualized chest. Electronically Signed  By: Oneil Devonshire M.D.   On: 10/20/2023 02:42   Result Date: 10/20/2023 CLINICAL DATA:  Risk stratification EXAM: Coronary Calcium  Score TECHNIQUE: The patient was scanned on a Siemens Somatom  scanner. Axial non-contrast 3 mm slices were carried out through the heart. The data set was analyzed on a dedicated work station and scored using the Agatson method. FINDINGS: Non-cardiac: See separate report from [facility]. Ascending Aorta: Normal size Pericardium: Normal Coronary arteries: Normal origin of left and right coronary arteries. Distribution of arterial calcifications if present, as noted below; LM 0 LAD 204 LCx 156 RCA 5.96 Total 366 IMPRESSION AND RECOMMENDATION: 1. Coronary calcium  score of 366. This was 97th percentile for age and sex matched control. 2. CAC >300 in LAD, LCx, RCA. CAC-DRS A3/N3. 3. Recommend aspirin and statin if no contraindication. 4. Recommend cardiology consultation. 5. Continue heart healthy lifestyle and risk factor modification. Electronically Signed: By: Redell Cave M.D. On: 09/27/2023 16:40       Assessment & Plan:  Essential hypertension, benign Assessment & Plan: Continue diovan . Blood pressure as outlined.  Have her spot check pressures. Discussed continued antiinflammatory use. Hold on making changes today. Send in blood pressure readings. Check metabolic panel.    Seizure disorder [facility]) Assessment & Plan: Remains on Carbatrol .  Follow levels.  Followed by neurology.  Follow CBC and liver function.  No recent seizures. Check level today.   Orders: -     VITAMIN D  25 Hydroxy (Vit-D Deficiency, Fractures) -     Carbamazepine  level, total  Hypercholesterolemia Assessment & Plan: On pravastatin .  Low-cholesterol diet and exercise.  Check lipid panel today.   Orders: -     Lipid panel -     Hepatic function panel -     Basic metabolic panel with GFR -     CBC with Differential/Platelet  Hypothyroidism, unspecified type Assessment & Plan: On thyroid  replacement. Follow tsh.   Orders: -     TSH  Anxiety Assessment & Plan: Was seeing Dr Chipper.  Continues on zoloft  and cymbalta . Stable. No changes in medication.    Colon  cancer screening Assessment & Plan: Colonoscopy 10/2021 - diverticulosis.  Recommended f/u colonoscopy in 8 years.    Ductal carcinoma in situ (DCIS) of right breast Assessment & Plan: Mammogram 09/04/23 - Birads II. Scheduled for f/u mammogram.    Stress Assessment & Plan: On Zoloft  and Cymbalta .  Appears to be handling things well. Follow. No changes today.    Obstructive sleep apnea Assessment & Plan: CPAP.    Other orders -     DULoxetine  HCl; Take 1 capsule (60 mg total) by mouth daily.  Dispense: 90 capsule; Refill: 1 -     Levothyroxine  Sodium; Take 1 tablet (150 mcg total) by mouth daily.  Dispense: 90 tablet; Refill: 3 -     Pravastatin  Sodium; Take 1 tablet (40 mg total) by mouth daily.  Dispense: 90 tablet; Refill: 3 -     Sertraline  HCl; Take 1 tablet (50 mg total) by mouth daily.  Dispense: 90 tablet; Refill: 1 -     Valsartan ; Take 1 tablet (160 mg total) by mouth daily.  Dispense: 90 tablet; Refill: 1     Allena Hamilton, MD

## 2024-08-20 NOTE — Assessment & Plan Note (Signed)
 Mammogram 09/04/23 - Birads II. Scheduled for f/u mammogram.

## 2024-08-20 NOTE — Assessment & Plan Note (Signed)
 On thyroid replacement.  Follow tsh.

## 2024-08-20 NOTE — Assessment & Plan Note (Signed)
 Continue diovan . Blood pressure as outlined.  Have her spot check pressures. Discussed continued antiinflammatory use. Hold on making changes today. Send in blood pressure readings. Check metabolic panel.

## 2024-08-20 NOTE — Assessment & Plan Note (Signed)
 Was seeing Dr Chipper.  Continues on zoloft  and cymbalta . Stable. No changes in medication.

## 2024-08-20 NOTE — Assessment & Plan Note (Signed)
On pravastatin.  Low cholesterol diet and exercise.  Check lipid panel today.

## 2024-08-20 NOTE — Assessment & Plan Note (Signed)
 CPAP.

## 2024-08-21 ENCOUNTER — Ambulatory Visit: Payer: Self-pay | Admitting: Internal Medicine

## 2024-08-21 DIAGNOSIS — H6120 Impacted cerumen, unspecified ear: Secondary | ICD-10-CM | POA: Insufficient documentation

## 2024-08-21 LAB — CARBAMAZEPINE LEVEL, TOTAL: Carbamazepine Lvl: 6.8 mg/L (ref 4.0–12.0)

## 2024-08-21 MED ORDER — VITAMIN D (ERGOCALCIFEROL) 1.25 MG (50000 UNIT) PO CAPS: 50000.0000 [IU] | ORAL_CAPSULE | ORAL | 1 refills | Status: AC

## 2024-08-21 MED ORDER — EZETIMIBE 10 MG PO TABS: 10.0000 mg | ORAL_TABLET | Freq: Every day | ORAL | 1 refills | Status: AC

## 2024-08-21 NOTE — Assessment & Plan Note (Signed)
 Sees Dr Juengel. She will call and make appt.

## 2024-08-21 NOTE — Telephone Encounter (Signed)
 Rx sent in for ergocalciferol  and zetia .

## 2024-08-31 ENCOUNTER — Other Ambulatory Visit: Payer: Self-pay | Admitting: Internal Medicine

## 2024-09-03 ENCOUNTER — Ambulatory Visit
Admission: RE | Admit: 2024-09-03 | Discharge: 2024-09-03 | Disposition: A | Discharge status: Home / Self Care | Source: Ambulatory Visit | Attending: Internal Medicine | Admitting: Internal Medicine

## 2024-09-03 DIAGNOSIS — E2839 Other primary ovarian failure: Secondary | ICD-10-CM | POA: Insufficient documentation

## 2024-09-03 DIAGNOSIS — Z1231 Encounter for screening mammogram for malignant neoplasm of breast: Secondary | ICD-10-CM | POA: Diagnosis present

## 2024-09-04 ENCOUNTER — Ambulatory Visit: Payer: Self-pay | Admitting: Internal Medicine

## 2024-09-04 NOTE — Telephone Encounter (Signed)
 Rx ok'd for diclofenac .

## 2024-10-02 ENCOUNTER — Other Ambulatory Visit: Payer: Self-pay | Admitting: Internal Medicine

## 2024-10-07 ENCOUNTER — Ambulatory Visit: Admitting: Internal Medicine

## 2024-10-07 ENCOUNTER — Encounter: Payer: Self-pay | Admitting: Internal Medicine

## 2024-10-07 VITALS — BP 130/70 | HR 79 | Temp 97.7°F | Ht 69.0 in | Wt 237.8 lb

## 2024-10-07 DIAGNOSIS — E7800 Pure hypercholesterolemia, unspecified: Secondary | ICD-10-CM

## 2024-10-07 DIAGNOSIS — E039 Hypothyroidism, unspecified: Secondary | ICD-10-CM

## 2024-10-07 DIAGNOSIS — F439 Reaction to severe stress, unspecified: Secondary | ICD-10-CM

## 2024-10-07 DIAGNOSIS — Z23 Encounter for immunization: Secondary | ICD-10-CM

## 2024-10-07 DIAGNOSIS — G40909 Epilepsy, unspecified, not intractable, without status epilepticus: Secondary | ICD-10-CM

## 2024-10-07 DIAGNOSIS — E559 Vitamin D deficiency, unspecified: Secondary | ICD-10-CM

## 2024-10-07 DIAGNOSIS — Z85820 Personal history of malignant melanoma of skin: Secondary | ICD-10-CM

## 2024-10-07 DIAGNOSIS — Z0000 Encounter for general adult medical examination without abnormal findings: Secondary | ICD-10-CM

## 2024-10-07 DIAGNOSIS — G4733 Obstructive sleep apnea (adult) (pediatric): Secondary | ICD-10-CM

## 2024-10-07 DIAGNOSIS — F419 Anxiety disorder, unspecified: Secondary | ICD-10-CM

## 2024-10-07 DIAGNOSIS — I10 Essential (primary) hypertension: Secondary | ICD-10-CM

## 2024-10-07 DIAGNOSIS — D0511 Intraductal carcinoma in situ of right breast: Secondary | ICD-10-CM

## 2024-10-07 NOTE — Assessment & Plan Note (Signed)
 Physical today 10/07/24.  Mammogram 09/03/24 - Birads II.  Colonoscopy 11/10/21 - diverticulosis.  Recommended f/u in 8 years (Dr Dessa).  PAP 05/2019 - negative with negative HPV - benign repair changes.  Repeat pap 07/15/20 - negative (atrophy) with negative HPV.  Repeat pap 08/2023 - negative with negative HPV.

## 2024-10-07 NOTE — Progress Notes (Signed)
 Subjective:    Patient ID: Jennifer Pratt, female    DOB: Oct 01, 1961, 63 y.o.   MRN: [id number]  Patient here for  Chief Complaint  Patient presents with   Annual Exam    HPI Here for a physical exam. Had f/u with neurology 01/24/24 - f/u seizures - remains on carbatrol . Stable.  No seizures.  On zoloft  and cymbalta . Doing well on these medications. Using cpap. Saw cardiology 12/29/23 - f/u coronary calcification. Recommended adding zetia  to pravastatin . Was questioning if the addition of zetia  was contributing to her knee pain and some aching in her hands. She just recently traveled to [place] . Did a lot of walking. Feels she may have aggravated her knees with this trip. Saw ortho 01/2024 - mucus cyst of the left middle finger - treated with bactrim s/p mucus excision. Increased stress - husband's health issues. Continues on diovan  for her blood pressure. Continues cpap. Using regularly. Benefiting from use. Had questions about GLP 1 agonists. Discussed medication and possible side effects. Discussed weight loss.    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 Dessa   BREAST LUMPECTOMY Right 07/17/2017   DCIS mammosite   CATARACT EXTRACTION     CESAREAN SECTION     COLONOSCOPY N/A 10/05/2016   Procedure: COLONOSCOPY;  Surgeon: Lamar ONEIDA Holmes, MD;  Location: [facility] ENDOSCOPY;  Service: Endoscopy;  Laterality: N/A;  Zosyn  IVPB   COLONOSCOPY WITH  PROPOFOL  N/A 11/10/2021   Procedure: COLONOSCOPY WITH PROPOFOL ;  Surgeon: Dessa Reyes ORN, MD;  Location: [facility] ENDOSCOPY;  Service: Endoscopy;  Laterality: N/A;   JOINT REPLACEMENT     MASTECTOMY, PARTIAL Right 07/17/2017   Procedure: MASTECTOMY PARTIAL;  Surgeon: Dessa Reyes ORN, MD;  Location: [facility] ORS;  Service: General;  Laterality: Right;   thumb surgery Right 2005   AND CTR   THYROIDECTOMY  1988   TOTAL KNEE ARTHROPLASTY Left 2012   Family History  Problem Relation Age of Onset   Diabetes Father    Dementia Mother    Breast cancer Neg Hx    Colon cancer Neg Hx    Social History   Socioeconomic History   Marital status: Married    Spouse name: Not on file   Number of children: 1   Years of education: Not on file   Highest education level: 12th grade  Occupational History    Employer: [employer]  Tobacco Use   Smoking status: Some Days    Current packs/day: 0.00    Average packs/day: 0.3 packs/day for 2.0 years (0.5 ttl pk-yrs)    Types: Cigarettes    Start date: 08/02/2015    Last attempt to quit: 08/01/2017    Years since quitting: 7.2   Smokeless tobacco: Never   Tobacco comments:    PT ONLY SMOKES SOCIALLY-SHE MAY GO A FEW MONTHS WITHOUT EVER SMOKING  Vaping Use   Vaping status: Never Used  Substance and Sexual Activity   Alcohol use: Not Currently   Drug use: No   Sexual activity: Yes  Other Topics Concern   Not on file  Social History Narrative   Not on file   Social Drivers of Health   Financial Resource Strain: Low Risk  (10/03/2024)   Overall Financial Resource Strain (CARDIA)    Difficulty of Paying Living Expenses: Not hard at all  Food Insecurity: No Food Insecurity (10/03/2024)   Hunger Vital Sign    Worried About Running Out of Food in the Last Year: Never true    Ran Out of Food in the Last Year: Never true  Transportation Needs: No Transportation Needs (10/03/2024)   PRAPARE - Administrator, Civil Service (Medical): No     Lack of Transportation (Non-Medical): No  Physical Activity: Insufficiently Active (10/03/2024)   Exercise Vital Sign    Days of Exercise per Week: 2 days    Minutes of Exercise per Session: 20 min  Stress: No Stress Concern Present (10/03/2024)   [employer] of Occupational Health - Occupational Stress Questionnaire    Feeling of Stress: Not at all  Social Connections: Socially Integrated (10/03/2024)   Social Connection and Isolation Panel    Frequency of Communication with Friends and Family: More than three times a week    Frequency of Social Gatherings with Friends and Family: Once a week    Attends Religious Services: More than 4 times per year    Active Member of [employer] or Organizations: Yes    Attends Engineer, Structural: More than 4 times per year    Marital Status: Married     Review of Systems  Constitutional:  Negative for appetite change and unexpected weight change.  HENT:  Negative for congestion, sinus pressure and sore throat.   Eyes:  Negative for pain and visual disturbance.  Respiratory:  Negative for cough, chest tightness and shortness of breath.   Cardiovascular:  Negative for chest pain, palpitations and leg swelling.  Gastrointestinal:  Negative for abdominal pain, diarrhea, nausea and vomiting.  Genitourinary:  Negative for difficulty urinating and dysuria.  Musculoskeletal:  Negative for joint swelling and myalgias.       Knee pain as outlined.   Skin:  Negative for color change and rash.  Neurological:  Negative for dizziness and headaches.  Hematological:  Negative for adenopathy. Does not bruise/bleed easily.  Psychiatric/Behavioral:  Negative for agitation and dysphoric mood.        Objective:     BP 130/70   Pulse 79   Temp 97.7 F (36.5 C) (Oral)   Ht 5' 9 (1.753 m)   Wt 237 lb 12.8 oz (107.9 kg)   LMP 10/09/2012   SpO2 95%   BMI 35.12 kg/m  Wt Readings from Last 3 Encounters:  10/07/24 237 lb 12.8 oz (107.9 kg)   08/20/24 240 lb 12.8 oz (109.2 kg)  12/29/23 245 lb 2 oz (111.2 kg)    Physical Exam Vitals reviewed.  Constitutional:      General: She is not in acute distress.    Appearance: Normal appearance. She is well-developed.  HENT:     Head: Normocephalic and atraumatic.     Right Ear: External ear normal.     Left Ear: External ear normal.     Mouth/Throat:     Pharynx: No oropharyngeal exudate or posterior oropharyngeal erythema.  Eyes:  General: No scleral icterus.       Right eye: No discharge.        Left eye: No discharge.     Conjunctiva/sclera: Conjunctivae normal.  Neck:     Thyroid : No thyromegaly.  Cardiovascular:     Rate and Rhythm: Normal rate and regular rhythm.  Pulmonary:     Effort: No tachypnea, accessory muscle usage or respiratory distress.     Breath sounds: Normal breath sounds. No decreased breath sounds or wheezing.  Chest:  Breasts:    Right: No inverted nipple, mass, nipple discharge or tenderness (no axillary adenopathy).     Left: No inverted nipple, mass, nipple discharge or tenderness (no axilarry adenopathy).  Abdominal:     General: Bowel sounds are normal.     Palpations: Abdomen is soft.     Tenderness: There is no abdominal tenderness.  Musculoskeletal:        General: No swelling or tenderness.     Cervical back: Neck supple.  Lymphadenopathy:     Cervical: No cervical adenopathy.  Skin:    Findings: No erythema or rash.  Neurological:     Mental Status: She is alert and oriented to person, place, and time.  Psychiatric:        Mood and Affect: Mood normal.        Behavior: Behavior normal.         Outpatient Encounter Medications as of 10/07/2024  Medication Sig   carbamazepine  (CARBATROL ) 200 MG 12 hr capsule TAKE 1 CAPSULE BY MOUTH TWICE A DAY   diclofenac  (VOLTAREN ) 75 MG EC tablet TAKE 1 TABLET BY MOUTH TWICE A DAY   DULoxetine  (CYMBALTA ) 60 MG capsule Take 1 capsule (60 mg total) by mouth daily.   ezetimibe  (ZETIA )  10 MG tablet Take 1 tablet (10 mg total) by mouth daily.   levothyroxine  (SYNTHROID ) 150 MCG tablet Take 1 tablet (150 mcg total) by mouth daily.   pravastatin  (PRAVACHOL ) 40 MG tablet Take 1 tablet (40 mg total) by mouth daily.   sertraline  (ZOLOFT ) 50 MG tablet Take 1 tablet (50 mg total) by mouth daily.   valsartan  (DIOVAN ) 160 MG tablet Take 1 tablet (160 mg total) by mouth daily.   Vitamin D , Ergocalciferol , (DRISDOL ) 1.25 MG (50000 UNIT) CAPS capsule Take 1 capsule (50,000 Units total) by mouth every 7 (seven) days.   [DISCONTINUED] diclofenac  (VOLTAREN ) 75 MG EC tablet TAKE 1 TABLET BY MOUTH TWICE A DAY   No facility-administered encounter medications on file as of 10/07/2024.     Lab Results  Component Value Date   WBC 4.8 08/20/2024   HGB 13.2 08/20/2024   HCT 39.2 08/20/2024   PLT 248.0 08/20/2024   GLUCOSE 100 (H) 08/20/2024   CHOL 217 (H) 08/20/2024   TRIG 171.0 (H) 08/20/2024   HDL 54.10 08/20/2024   LDLDIRECT 160.0 07/23/2020   LDLCALC 129 (H) 08/20/2024   ALT 22 08/20/2024   AST 16 08/20/2024   NA 141 08/20/2024   K 4.1 08/20/2024   CL 103 08/20/2024   CREATININE 0.87 08/20/2024   BUN 15 08/20/2024   CO2 30 08/20/2024   TSH 3.79 08/20/2024   INR 1.0 04/03/2020   HGBA1C 5.4 12/30/2020    MM 3D SCREENING MAMMOGRAM BILATERAL BREAST Result Date: 09/05/2024 CLINICAL DATA:  Screening. Personal history malignant RIGHT lumpectomy in 2018. EXAM: DIGITAL SCREENING BILATERAL MAMMOGRAM WITH TOMOSYNTHESIS AND CAD TECHNIQUE: Bilateral screening digital craniocaudal and mediolateral oblique mammograms were obtained. Bilateral screening digital breast tomosynthesis was  performed. The images were evaluated with computer-aided detection. COMPARISON:  Previous exam(s). ACR Breast Density Category b: There are scattered areas of fibroglandular density. FINDINGS: There are no findings suspicious for malignancy. Expected post lumpectomy changes involving the RIGHT breast. IMPRESSION: No  mammographic evidence of malignancy. A result letter of this screening mammogram will be mailed directly to the patient. RECOMMENDATION: Screening mammogram in one year. (Code:SM-B-01Y) BI-RADS CATEGORY  2: Benign. Electronically Signed   By: Debby Satterfield M.D.   On: 09/05/2024 11:47   DG Bone Density Result Date: 09/03/2024 EXAM: DUAL X-RAY ABSORPTIOMETRY (DXA) FOR BONE MINERAL DENSITY 09/03/2024 9:09 am CLINICAL DATA:  63 year old Female Postmenopausal. Estrogen deficiency Patient is or has been on bone building therapies. TECHNIQUE: An axial (e.g., hips, spine) and/or appendicular (e.g., radius) exam was performed, as appropriate, using GE Secretary/administrator at [facility]. Images are obtained for bone mineral density measurement and are not obtained for diagnostic purposes. [id number] Exclusions: Lumbar spine due to degenerative changes; right hip due to surgery. COMPARISON:  None. FINDINGS: Scan quality: Good. LEFT FEMORAL NECK: BMD (in g/cm2): 1.066 T-score: 0.2 Z-score: 1.5 LEFT TOTAL HIP: BMD (in g/cm2): 1.126 T-score: 0.9 Z-score: 2.0 LEFT FOREARM (RADIUS 33%): BMD (in g/cm2): 0.972 T-score: 1.1 Z-score: 2.2 FRAX 10-YEAR PROBABILITY OF FRACTURE: FRAX not reported as the lowest BMD is not in the osteopenia range. IMPRESSION: Normal based on BMD. Fracture risk is unknown due to history of bone building therapy. RECOMMENDATIONS: 1. All patients should optimize calcium  and vitamin D  intake. 2. Consider FDA-approved medical therapies in postmenopausal women and men aged 36 years and older, based on the following: - A hip or vertebral (clinical or morphometric) fracture - T-score less than or equal to -2.5 and secondary causes have been excluded. - Low bone mass (T-score between -1.0 and -2.5) and a 10-year probability of a hip fracture greater than or equal to 3% or a 10-year probability of a major osteoporosis-related fracture greater than or equal to 20% based on the [place] -adapted  WHO algorithm. - Clinician judgment and/or patient preferences may indicate treatment for people with 10-year fracture probabilities above or below these levels 3. Patients with diagnosis of osteoporosis or at high risk for fracture should have regular bone mineral density tests. For patients eligible for Medicare, routine testing is allowed once every 2 years. The testing frequency can be increased to one year for patients who have rapidly progressing disease, those who are receiving or discontinuing medical therapy to restore bone mass, or have additional risk factors. Electronically Signed   By: Harrietta Sherry M.D.   On: 09/03/2024 09:34       Assessment & Plan:  Routine general medical examination at a health care facility  Health care maintenance Assessment & Plan: Physical today 10/07/24.  Mammogram 09/03/24 - Birads II.  Colonoscopy 11/10/21 - diverticulosis.  Recommended f/u in 8 years (Dr Dessa).  PAP 05/2019 - negative with negative HPV - benign repair changes.  Repeat pap 07/15/20 - negative (atrophy) with negative HPV.  Repeat pap 08/2023 - negative with negative HPV.    Hypercholesterolemia Assessment & Plan: On pravastatin .  Low-cholesterol diet and exercise.  Follow lipid panel.   Orders: -     Basic metabolic panel with GFR; Future -     Hepatic function panel; Future -     Lipid panel; Future  Vitamin D  deficiency Assessment & Plan: Check vitamin D  level with next labs.   Orders: -  VITAMIN D  25 Hydroxy (Vit-D Deficiency, Fractures); Future  Need for influenza vaccination -     Flu vaccine trivalent PF, 6mos and older(Flulaval,Afluria,Fluarix,Fluzone)  Stress Assessment & Plan: Continues on zoloft  and cymbalta . Overall appears to be handling things relatively well. Follow.    Seizure disorder [facility]) Assessment & Plan: Remains on Carbatrol .  Follow levels.  Followed by neurology.  Follow CBC and liver function.  No recent seizures.  Follow levels.     Obstructive sleep apnea Assessment & Plan: CPAP.    Hypothyroidism, unspecified type Assessment & Plan: Continue thyroid  replacement. Follow tsh.    History of melanoma Assessment & Plan: Followed by dermatology.    Essential hypertension, benign Assessment & Plan: Continue diovan . Blood pressure as outlined. No changes today. Follow metabolic panel.    Ductal carcinoma in situ (DCIS) of right breast Assessment & Plan: Mammogram 09/03/24 - Birads II.    Anxiety Assessment & Plan: Continue zoloft  and cymbalta . Stable.       Allena Hamilton, MD

## 2024-10-13 ENCOUNTER — Encounter: Payer: Self-pay | Admitting: Internal Medicine

## 2024-10-13 NOTE — Assessment & Plan Note (Signed)
 Followed by dermatology

## 2024-10-13 NOTE — Assessment & Plan Note (Signed)
On pravastatin.  Low cholesterol diet and exercise.  Follow lipid panel.   

## 2024-10-13 NOTE — Assessment & Plan Note (Signed)
 Continue diovan . Blood pressure as outlined. No changes today. Follow metabolic panel.

## 2024-10-13 NOTE — Assessment & Plan Note (Signed)
 Check vitamin D level with next labs.  ?

## 2024-10-13 NOTE — Assessment & Plan Note (Signed)
 Remains on Carbatrol .  Follow levels.  Followed by neurology.  Follow CBC and liver function.  No recent seizures.  Follow levels.

## 2024-10-13 NOTE — Assessment & Plan Note (Signed)
 Mammogram 09/03/24 - Birads II.

## 2024-10-13 NOTE — Assessment & Plan Note (Signed)
 Continues on zoloft  and cymbalta . Overall appears to be handling things relatively well. Follow.

## 2024-10-13 NOTE — Assessment & Plan Note (Signed)
 CPAP.

## 2024-10-13 NOTE — Assessment & Plan Note (Signed)
 Continue zoloft  and cymbalta . Stable.

## 2024-10-13 NOTE — Assessment & Plan Note (Signed)
Continue thyroid replacement.  Follow tsh.   

## 2024-11-05 ENCOUNTER — Other Ambulatory Visit: Payer: Self-pay | Admitting: Internal Medicine

## 2024-12-02 ENCOUNTER — Encounter: Payer: Self-pay | Admitting: Internal Medicine

## 2024-12-02 MED ORDER — ZEPBOUND 2.5 MG/0.5ML ~~LOC~~ SOAJ: 2.5000 mg | SUBCUTANEOUS | 2 refills | Status: AC

## 2024-12-02 NOTE — Telephone Encounter (Signed)
 Rx sent in to [facility] for zepbound .

## 2024-12-03 ENCOUNTER — Other Ambulatory Visit: Payer: Self-pay | Admitting: Internal Medicine

## 2024-12-04 ENCOUNTER — Telehealth: Payer: Self-pay

## 2024-12-04 ENCOUNTER — Other Ambulatory Visit (HOSPITAL_COMMUNITY): Payer: Self-pay

## 2024-12-04 NOTE — Telephone Encounter (Signed)
 Pharmacy Patient Advocate Encounter   Received notification from Physician's Office that prior authorization for Zepbound  2.5MG /0.5ML pen-injectors is required/requested.   Insurance verification completed.   The patient is insured through [facility].   Per test claim: PA required; PA started via CoverMyMeds. KEY [id number] . Waiting for clinical questions to populate.

## 2024-12-04 NOTE — Telephone Encounter (Signed)
 PA request has been submitted to PA team

## 2024-12-04 NOTE — Telephone Encounter (Signed)
 PA for zepbound  is needed.

## 2024-12-04 NOTE — Telephone Encounter (Signed)
 Prior Authorization form/request asks a question that requires your assistance. Please see the question below and advise accordingly. The PA will not be submitted until the necessary information is received.   What is the diagnosis for the medication being requested? If diagnosis is for sleep apnea. Please provide Sleep study if patient has had one.

## 2024-12-04 NOTE — Telephone Encounter (Signed)
 Noted

## 2024-12-04 NOTE — Telephone Encounter (Signed)
 PA request has been Started. New Encounter has been or will be created for follow up. For additional info see Pharmacy Prior Auth telephone encounter from 12/04/2024.

## 2024-12-05 NOTE — Telephone Encounter (Signed)
 Lvm for pt to return call. Need to know where pt had sleep study done to request records. Okay to [employer] also sent

## 2024-12-05 NOTE — Telephone Encounter (Signed)
 She does have sleep apnea. Per note, will need to get a copy of sleep study and forward.

## 2024-12-11 ENCOUNTER — Other Ambulatory Visit (HOSPITAL_COMMUNITY): Payer: Self-pay

## 2025-01-07 ENCOUNTER — Other Ambulatory Visit

## 2025-01-09 ENCOUNTER — Ambulatory Visit: Admitting: Internal Medicine

## 2025-02-05 ENCOUNTER — Telehealth: Payer: 59 | Admitting: Neurology
